# Patient Record
Sex: Female | Born: 1960 | Race: White | Hispanic: No | Marital: Married | Smoking: Never smoker
Health system: Southern US, Community
[De-identification: ages and names within clinical notes are randomized; demographics above are authoritative.]

## PROBLEM LIST (undated history)

## (undated) DIAGNOSIS — M329 Systemic lupus erythematosus, unspecified: Secondary | ICD-10-CM

## (undated) DIAGNOSIS — F419 Anxiety disorder, unspecified: Secondary | ICD-10-CM

## (undated) DIAGNOSIS — K2950 Unspecified chronic gastritis without bleeding: Secondary | ICD-10-CM

## (undated) DIAGNOSIS — K219 Gastro-esophageal reflux disease without esophagitis: Secondary | ICD-10-CM

## (undated) DIAGNOSIS — M1990 Unspecified osteoarthritis, unspecified site: Secondary | ICD-10-CM

## (undated) DIAGNOSIS — K859 Acute pancreatitis without necrosis or infection, unspecified: Secondary | ICD-10-CM

## (undated) DIAGNOSIS — K589 Irritable bowel syndrome without diarrhea: Secondary | ICD-10-CM

## (undated) DIAGNOSIS — E785 Hyperlipidemia, unspecified: Secondary | ICD-10-CM

## (undated) DIAGNOSIS — IMO0002 Reserved for concepts with insufficient information to code with codable children: Secondary | ICD-10-CM

## (undated) DIAGNOSIS — E538 Deficiency of other specified B group vitamins: Secondary | ICD-10-CM

## (undated) DIAGNOSIS — K621 Rectal polyp: Secondary | ICD-10-CM

## (undated) DIAGNOSIS — J45909 Unspecified asthma, uncomplicated: Secondary | ICD-10-CM

## (undated) HISTORY — PX: ESOPHAGUS SURGERY: SHX626

## (undated) HISTORY — DX: Gastro-esophageal reflux disease without esophagitis: K21.9

## (undated) HISTORY — DX: Acute pancreatitis without necrosis or infection, unspecified: K85.90

## (undated) HISTORY — DX: Deficiency of other specified B group vitamins: E53.8

## (undated) HISTORY — DX: Unspecified asthma, uncomplicated: J45.909

## (undated) HISTORY — DX: Unspecified osteoarthritis, unspecified site: M19.90

## (undated) HISTORY — DX: Anxiety disorder, unspecified: F41.9

## (undated) HISTORY — PX: BREAST CYST ASPIRATION: SHX578

## (undated) HISTORY — DX: Unspecified chronic gastritis without bleeding: K29.50

## (undated) HISTORY — DX: Irritable bowel syndrome, unspecified: K58.9

## (undated) HISTORY — DX: Rectal polyp: K62.1

## (undated) HISTORY — DX: Hyperlipidemia, unspecified: E78.5

## (undated) HISTORY — PX: SINUS SURGERY WITH INSTATRAK: SHX5215

---

## 1998-02-13 ENCOUNTER — Ambulatory Visit (HOSPITAL_COMMUNITY): Admission: RE | Admit: 1998-02-13 | Discharge: 1998-02-13 | Payer: Self-pay | Admitting: Internal Medicine

## 2000-05-21 ENCOUNTER — Other Ambulatory Visit: Admission: RE | Admit: 2000-05-21 | Discharge: 2000-05-21 | Payer: Self-pay | Admitting: Internal Medicine

## 2000-05-21 ENCOUNTER — Encounter (INDEPENDENT_AMBULATORY_CARE_PROVIDER_SITE_OTHER): Payer: Self-pay | Admitting: Specialist

## 2002-01-04 ENCOUNTER — Encounter: Admission: RE | Admit: 2002-01-04 | Discharge: 2002-01-04 | Payer: Self-pay | Admitting: Rheumatology

## 2002-01-04 ENCOUNTER — Encounter: Payer: Self-pay | Admitting: Rheumatology

## 2004-08-26 ENCOUNTER — Ambulatory Visit: Payer: Self-pay | Admitting: Internal Medicine

## 2004-09-20 ENCOUNTER — Ambulatory Visit: Payer: Self-pay | Admitting: Internal Medicine

## 2004-10-14 ENCOUNTER — Ambulatory Visit: Payer: Self-pay | Admitting: Internal Medicine

## 2005-01-30 ENCOUNTER — Ambulatory Visit: Payer: Self-pay | Admitting: Internal Medicine

## 2005-01-31 ENCOUNTER — Ambulatory Visit: Payer: Self-pay | Admitting: Internal Medicine

## 2005-02-04 ENCOUNTER — Encounter (INDEPENDENT_AMBULATORY_CARE_PROVIDER_SITE_OTHER): Payer: Self-pay | Admitting: Specialist

## 2005-02-04 ENCOUNTER — Ambulatory Visit (HOSPITAL_COMMUNITY): Admission: RE | Admit: 2005-02-04 | Discharge: 2005-02-04 | Payer: Self-pay | Admitting: Internal Medicine

## 2005-02-04 ENCOUNTER — Ambulatory Visit: Payer: Self-pay | Admitting: Internal Medicine

## 2005-02-25 ENCOUNTER — Ambulatory Visit: Payer: Self-pay | Admitting: Internal Medicine

## 2005-07-18 ENCOUNTER — Ambulatory Visit: Payer: Self-pay | Admitting: Internal Medicine

## 2005-08-07 ENCOUNTER — Ambulatory Visit: Payer: Self-pay | Admitting: Internal Medicine

## 2005-08-18 ENCOUNTER — Encounter (HOSPITAL_COMMUNITY): Admission: RE | Admit: 2005-08-18 | Discharge: 2005-11-16 | Payer: Self-pay | Admitting: Internal Medicine

## 2005-09-01 ENCOUNTER — Emergency Department (HOSPITAL_COMMUNITY): Admission: EM | Admit: 2005-09-01 | Discharge: 2005-09-01 | Payer: Self-pay | Admitting: Emergency Medicine

## 2005-09-25 ENCOUNTER — Ambulatory Visit: Payer: Self-pay | Admitting: Internal Medicine

## 2005-10-14 ENCOUNTER — Ambulatory Visit: Payer: Self-pay | Admitting: Internal Medicine

## 2005-10-14 ENCOUNTER — Ambulatory Visit (HOSPITAL_COMMUNITY): Admission: RE | Admit: 2005-10-14 | Discharge: 2005-10-14 | Payer: Self-pay | Admitting: Cardiology

## 2005-11-17 ENCOUNTER — Ambulatory Visit: Payer: Self-pay | Admitting: Internal Medicine

## 2005-11-20 ENCOUNTER — Ambulatory Visit: Payer: Self-pay | Admitting: Internal Medicine

## 2005-12-10 ENCOUNTER — Ambulatory Visit: Payer: Self-pay | Admitting: Internal Medicine

## 2005-12-25 ENCOUNTER — Encounter (HOSPITAL_COMMUNITY): Admission: RE | Admit: 2005-12-25 | Discharge: 2006-03-25 | Payer: Self-pay | Admitting: Internal Medicine

## 2006-01-09 ENCOUNTER — Ambulatory Visit: Payer: Self-pay | Admitting: Internal Medicine

## 2006-02-05 ENCOUNTER — Emergency Department (HOSPITAL_COMMUNITY): Admission: EM | Admit: 2006-02-05 | Discharge: 2006-02-05 | Payer: Self-pay | Admitting: Emergency Medicine

## 2006-02-10 ENCOUNTER — Ambulatory Visit: Payer: Self-pay | Admitting: Internal Medicine

## 2006-02-26 ENCOUNTER — Ambulatory Visit: Payer: Self-pay | Admitting: Internal Medicine

## 2006-03-02 ENCOUNTER — Ambulatory Visit: Payer: Self-pay | Admitting: Internal Medicine

## 2006-03-04 ENCOUNTER — Ambulatory Visit: Payer: Self-pay | Admitting: Internal Medicine

## 2006-04-23 ENCOUNTER — Ambulatory Visit: Payer: Self-pay | Admitting: Internal Medicine

## 2006-05-04 ENCOUNTER — Ambulatory Visit: Payer: Self-pay | Admitting: Internal Medicine

## 2006-06-05 ENCOUNTER — Ambulatory Visit: Payer: Self-pay | Admitting: Internal Medicine

## 2006-06-17 ENCOUNTER — Ambulatory Visit: Payer: Self-pay | Admitting: Internal Medicine

## 2006-08-26 ENCOUNTER — Ambulatory Visit: Payer: Self-pay | Admitting: Internal Medicine

## 2006-10-20 ENCOUNTER — Ambulatory Visit: Payer: Self-pay | Admitting: Internal Medicine

## 2007-03-08 ENCOUNTER — Ambulatory Visit: Payer: Self-pay | Admitting: Internal Medicine

## 2007-04-05 ENCOUNTER — Ambulatory Visit: Payer: Self-pay | Admitting: Internal Medicine

## 2007-05-14 ENCOUNTER — Ambulatory Visit: Payer: Self-pay | Admitting: Internal Medicine

## 2007-05-14 LAB — CONVERTED CEMR LAB
ALT: 25 units/L (ref 0–35)
AST: 20 units/L (ref 0–37)
Albumin: 3.8 g/dL (ref 3.5–5.2)
Alkaline Phosphatase: 65 units/L (ref 39–117)
BUN: 15 mg/dL (ref 6–23)
Basophils Absolute: 0.1 10*3/uL (ref 0.0–0.1)
Basophils Relative: 1.3 % — ABNORMAL HIGH (ref 0.0–1.0)
Bilirubin, Direct: 0.1 mg/dL (ref 0.0–0.3)
CO2: 23 meq/L (ref 19–32)
Calcium: 9.6 mg/dL (ref 8.4–10.5)
Chloride: 107 meq/L (ref 96–112)
Creatinine, Ser: 0.7 mg/dL (ref 0.4–1.2)
Eosinophils Absolute: 0.2 10*3/uL (ref 0.0–0.6)
Eosinophils Relative: 3.4 % (ref 0.0–5.0)
GFR calc Af Amer: 116 mL/min
GFR calc non Af Amer: 96 mL/min
Glucose, Bld: 116 mg/dL — ABNORMAL HIGH (ref 70–99)
HCT: 32.5 % — ABNORMAL LOW (ref 36.0–46.0)
Hemoglobin: 11 g/dL — ABNORMAL LOW (ref 12.0–15.0)
Lymphocytes Relative: 28.9 % (ref 12.0–46.0)
MCHC: 33.7 g/dL (ref 30.0–36.0)
MCV: 76.9 fL — ABNORMAL LOW (ref 78.0–100.0)
Monocytes Absolute: 0.5 10*3/uL (ref 0.2–0.7)
Monocytes Relative: 7.1 % (ref 3.0–11.0)
Neutro Abs: 4 10*3/uL (ref 1.4–7.7)
Neutrophils Relative %: 59.3 % (ref 43.0–77.0)
Platelets: 416 10*3/uL — ABNORMAL HIGH (ref 150–400)
Potassium: 4 meq/L (ref 3.5–5.1)
RBC: 4.23 M/uL (ref 3.87–5.11)
RDW: 15.7 % — ABNORMAL HIGH (ref 11.5–14.6)
Sed Rate: 38 mm/hr — ABNORMAL HIGH (ref 0–25)
Sodium: 138 meq/L (ref 135–145)
Total Bilirubin: 0.7 mg/dL (ref 0.3–1.2)
Total Protein: 7.3 g/dL (ref 6.0–8.3)
WBC: 6.7 10*3/uL (ref 4.5–10.5)

## 2007-08-11 ENCOUNTER — Ambulatory Visit: Payer: Self-pay | Admitting: Internal Medicine

## 2007-08-13 ENCOUNTER — Encounter: Payer: Self-pay | Admitting: Internal Medicine

## 2007-08-13 ENCOUNTER — Ambulatory Visit: Payer: Self-pay | Admitting: Internal Medicine

## 2007-08-16 ENCOUNTER — Ambulatory Visit: Payer: Self-pay | Admitting: Internal Medicine

## 2007-09-25 DIAGNOSIS — Z8719 Personal history of other diseases of the digestive system: Secondary | ICD-10-CM | POA: Insufficient documentation

## 2007-09-25 DIAGNOSIS — F411 Generalized anxiety disorder: Secondary | ICD-10-CM | POA: Insufficient documentation

## 2007-09-25 DIAGNOSIS — K589 Irritable bowel syndrome without diarrhea: Secondary | ICD-10-CM

## 2007-09-25 DIAGNOSIS — M129 Arthropathy, unspecified: Secondary | ICD-10-CM | POA: Insufficient documentation

## 2007-09-25 DIAGNOSIS — K5190 Ulcerative colitis, unspecified, without complications: Secondary | ICD-10-CM | POA: Insufficient documentation

## 2008-01-11 ENCOUNTER — Encounter: Payer: Self-pay | Admitting: Internal Medicine

## 2008-01-19 ENCOUNTER — Encounter: Payer: Self-pay | Admitting: Internal Medicine

## 2008-02-04 ENCOUNTER — Ambulatory Visit: Payer: Self-pay | Admitting: Internal Medicine

## 2008-02-04 DIAGNOSIS — K2970 Gastritis, unspecified, without bleeding: Secondary | ICD-10-CM | POA: Insufficient documentation

## 2008-02-04 DIAGNOSIS — R1013 Epigastric pain: Secondary | ICD-10-CM

## 2008-02-04 DIAGNOSIS — K2990 Gastroduodenitis, unspecified, without bleeding: Secondary | ICD-10-CM

## 2008-02-04 LAB — CONVERTED CEMR LAB
ALT: 19 units/L (ref 0–35)
AST: 18 units/L (ref 0–37)
Albumin: 3.6 g/dL (ref 3.5–5.2)
Alkaline Phosphatase: 71 units/L (ref 39–117)
BUN: 13 mg/dL (ref 6–23)
Basophils Absolute: 0 10*3/uL (ref 0.0–0.1)
Basophils Relative: 0.7 % (ref 0.0–1.0)
CO2: 27 meq/L (ref 19–32)
Calcium: 9.2 mg/dL (ref 8.4–10.5)
Chloride: 110 meq/L (ref 96–112)
Creatinine, Ser: 0.7 mg/dL (ref 0.4–1.2)
Eosinophils Absolute: 0.2 10*3/uL (ref 0.0–0.7)
Eosinophils Relative: 3 % (ref 0.0–5.0)
GFR calc Af Amer: 115 mL/min
GFR calc non Af Amer: 95 mL/min
Glucose, Bld: 104 mg/dL — ABNORMAL HIGH (ref 70–99)
HCT: 35.6 % — ABNORMAL LOW (ref 36.0–46.0)
Hemoglobin: 12 g/dL (ref 12.0–15.0)
Iron: 46 ug/dL (ref 42–145)
Lymphocytes Relative: 32.3 % (ref 12.0–46.0)
MCHC: 33.8 g/dL (ref 30.0–36.0)
MCV: 83.1 fL (ref 78.0–100.0)
Monocytes Absolute: 0.4 10*3/uL (ref 0.1–1.0)
Monocytes Relative: 6 % (ref 3.0–12.0)
Neutro Abs: 3.9 10*3/uL (ref 1.4–7.7)
Neutrophils Relative %: 58 % (ref 43.0–77.0)
Platelets: 378 10*3/uL (ref 150–400)
Potassium: 4.1 meq/L (ref 3.5–5.1)
RBC: 4.28 M/uL (ref 3.87–5.11)
RDW: 13.7 % (ref 11.5–14.6)
Saturation Ratios: 10.5 % — ABNORMAL LOW (ref 20.0–50.0)
Sed Rate: 32 mm/hr — ABNORMAL HIGH (ref 0–22)
Sodium: 142 meq/L (ref 135–145)
Total Bilirubin: 0.5 mg/dL (ref 0.3–1.2)
Total Protein: 7.1 g/dL (ref 6.0–8.3)
Transferrin: 311.6 mg/dL (ref >=212.0)
WBC: 6.6 10*3/uL (ref 4.5–10.5)

## 2008-02-11 ENCOUNTER — Ambulatory Visit (HOSPITAL_COMMUNITY): Admission: RE | Admit: 2008-02-11 | Discharge: 2008-02-11 | Payer: Self-pay | Admitting: Internal Medicine

## 2008-02-11 ENCOUNTER — Telehealth: Payer: Self-pay | Admitting: Internal Medicine

## 2008-03-13 ENCOUNTER — Telehealth: Payer: Self-pay | Admitting: Internal Medicine

## 2008-03-20 ENCOUNTER — Telehealth: Payer: Self-pay | Admitting: Internal Medicine

## 2008-03-27 ENCOUNTER — Ambulatory Visit: Payer: Self-pay | Admitting: Internal Medicine

## 2008-03-29 ENCOUNTER — Telehealth (INDEPENDENT_AMBULATORY_CARE_PROVIDER_SITE_OTHER): Payer: Self-pay | Admitting: *Deleted

## 2008-04-24 ENCOUNTER — Telehealth: Payer: Self-pay | Admitting: Internal Medicine

## 2008-04-26 ENCOUNTER — Ambulatory Visit: Payer: Self-pay | Admitting: Internal Medicine

## 2008-05-09 ENCOUNTER — Telehealth: Payer: Self-pay | Admitting: Internal Medicine

## 2008-06-01 ENCOUNTER — Encounter: Payer: Self-pay | Admitting: Internal Medicine

## 2008-06-14 ENCOUNTER — Ambulatory Visit: Payer: Self-pay | Admitting: Internal Medicine

## 2008-06-26 ENCOUNTER — Encounter: Payer: Self-pay | Admitting: Internal Medicine

## 2008-07-11 ENCOUNTER — Encounter: Payer: Self-pay | Admitting: Internal Medicine

## 2008-07-11 ENCOUNTER — Telehealth: Payer: Self-pay | Admitting: Internal Medicine

## 2008-07-14 ENCOUNTER — Telehealth: Payer: Self-pay | Admitting: Internal Medicine

## 2008-07-24 ENCOUNTER — Telehealth: Payer: Self-pay | Admitting: Internal Medicine

## 2008-08-11 ENCOUNTER — Encounter: Payer: Self-pay | Admitting: Internal Medicine

## 2008-10-02 ENCOUNTER — Ambulatory Visit: Payer: Self-pay | Admitting: Internal Medicine

## 2008-10-05 ENCOUNTER — Telehealth: Payer: Self-pay | Admitting: Internal Medicine

## 2008-10-23 ENCOUNTER — Telehealth: Payer: Self-pay | Admitting: Internal Medicine

## 2008-12-18 ENCOUNTER — Ambulatory Visit: Payer: Self-pay | Admitting: Internal Medicine

## 2008-12-21 ENCOUNTER — Ambulatory Visit: Payer: Self-pay | Admitting: Internal Medicine

## 2008-12-21 ENCOUNTER — Encounter: Payer: Self-pay | Admitting: Internal Medicine

## 2008-12-25 ENCOUNTER — Encounter: Payer: Self-pay | Admitting: Internal Medicine

## 2008-12-25 LAB — CONVERTED CEMR LAB
Albumin: 4 g/dL (ref 3.5–5.2)
Alkaline Phosphatase: 84 units/L (ref 39–117)
Basophils Relative: 5 % — ABNORMAL HIGH (ref 0.0–3.0)
Eosinophils Absolute: 0 10*3/uL (ref 0.0–0.7)
HCT: 38.8 % (ref 36.0–46.0)
Hemoglobin: 13.4 g/dL (ref 12.0–15.0)
Lymphs Abs: 1.7 10*3/uL (ref 0.7–4.0)
MCHC: 34.5 g/dL (ref 30.0–36.0)
MCV: 82.7 fL (ref 78.0–100.0)
Monocytes Absolute: 0.5 10*3/uL (ref 0.1–1.0)
Neutro Abs: 8 10*3/uL — ABNORMAL HIGH (ref 1.4–7.7)
RBC: 4.68 M/uL (ref 3.87–5.11)
Total Protein: 7.6 g/dL (ref 6.0–8.3)

## 2009-03-15 ENCOUNTER — Ambulatory Visit: Payer: Self-pay | Admitting: Internal Medicine

## 2009-03-18 ENCOUNTER — Encounter: Payer: Self-pay | Admitting: Internal Medicine

## 2009-03-23 ENCOUNTER — Telehealth: Payer: Self-pay | Admitting: Internal Medicine

## 2009-06-18 ENCOUNTER — Telehealth: Payer: Self-pay | Admitting: Internal Medicine

## 2009-08-14 ENCOUNTER — Ambulatory Visit: Payer: Self-pay | Admitting: Internal Medicine

## 2009-09-11 ENCOUNTER — Encounter: Payer: Self-pay | Admitting: Internal Medicine

## 2009-10-31 ENCOUNTER — Telehealth: Payer: Self-pay | Admitting: Internal Medicine

## 2009-11-29 ENCOUNTER — Ambulatory Visit: Payer: Self-pay | Admitting: Internal Medicine

## 2009-12-03 ENCOUNTER — Telehealth: Payer: Self-pay | Admitting: Internal Medicine

## 2009-12-17 ENCOUNTER — Telehealth: Payer: Self-pay | Admitting: Internal Medicine

## 2009-12-27 ENCOUNTER — Telehealth: Payer: Self-pay | Admitting: Internal Medicine

## 2010-02-01 ENCOUNTER — Telehealth: Payer: Self-pay | Admitting: Internal Medicine

## 2010-03-12 ENCOUNTER — Encounter: Payer: Self-pay | Admitting: Internal Medicine

## 2010-03-21 ENCOUNTER — Ambulatory Visit: Payer: Self-pay | Admitting: Internal Medicine

## 2010-03-21 LAB — CONVERTED CEMR LAB
AST: 14 units/L (ref 0–37)
BUN: 13 mg/dL (ref 6–23)
Calcium: 9.1 mg/dL (ref 8.4–10.5)
Chloride: 105 meq/L (ref 96–112)
Creatinine, Ser: 0.8 mg/dL (ref 0.4–1.2)
Iron: 81 ug/dL (ref 42–145)
Transferrin: 316 mg/dL (ref 212.0–360.0)

## 2010-03-25 ENCOUNTER — Ambulatory Visit: Payer: Self-pay | Admitting: Internal Medicine

## 2010-03-26 ENCOUNTER — Ambulatory Visit: Payer: Self-pay | Admitting: Internal Medicine

## 2010-03-26 DIAGNOSIS — E538 Deficiency of other specified B group vitamins: Secondary | ICD-10-CM | POA: Insufficient documentation

## 2010-03-27 ENCOUNTER — Ambulatory Visit: Payer: Self-pay | Admitting: Internal Medicine

## 2010-04-04 ENCOUNTER — Ambulatory Visit: Payer: Self-pay | Admitting: Internal Medicine

## 2010-04-11 ENCOUNTER — Ambulatory Visit: Payer: Self-pay | Admitting: Internal Medicine

## 2010-05-24 ENCOUNTER — Telehealth: Payer: Self-pay | Admitting: Internal Medicine

## 2010-06-13 ENCOUNTER — Telehealth: Payer: Self-pay | Admitting: Internal Medicine

## 2010-06-14 ENCOUNTER — Telehealth: Payer: Self-pay | Admitting: Internal Medicine

## 2010-06-28 ENCOUNTER — Telehealth: Payer: Self-pay | Admitting: Internal Medicine

## 2010-07-23 ENCOUNTER — Ambulatory Visit: Payer: Self-pay | Admitting: Internal Medicine

## 2010-08-20 ENCOUNTER — Ambulatory Visit: Payer: Self-pay | Admitting: Internal Medicine

## 2010-09-10 ENCOUNTER — Telehealth: Payer: Self-pay | Admitting: Internal Medicine

## 2010-09-18 ENCOUNTER — Other Ambulatory Visit: Payer: Self-pay | Admitting: Internal Medicine

## 2010-09-18 ENCOUNTER — Ambulatory Visit
Admission: RE | Admit: 2010-09-18 | Discharge: 2010-09-18 | Payer: Self-pay | Source: Home / Self Care | Attending: Internal Medicine | Admitting: Internal Medicine

## 2010-09-18 LAB — COMPREHENSIVE METABOLIC PANEL
ALT: 16 U/L (ref 0–35)
AST: 18 U/L (ref 0–37)
Albumin: 3.6 g/dL (ref 3.5–5.2)
Alkaline Phosphatase: 73 U/L (ref 39–117)
BUN: 11 mg/dL (ref 6–23)
CO2: 25 mEq/L (ref 19–32)
Calcium: 9.5 mg/dL (ref 8.4–10.5)
Chloride: 107 mEq/L (ref 96–112)
Creatinine, Ser: 0.8 mg/dL (ref 0.4–1.2)
GFR: 86.89 mL/min (ref >60.00)
Glucose, Bld: 89 mg/dL (ref 70–99)
Potassium: 4.2 mEq/L (ref 3.5–5.1)
Sodium: 140 mEq/L (ref 135–145)
Total Bilirubin: 0.3 mg/dL (ref 0.3–1.2)
Total Protein: 7 g/dL (ref 6.0–8.3)

## 2010-09-18 LAB — CBC WITH DIFFERENTIAL/PLATELET
Basophils Absolute: 0 10*3/uL (ref 0.0–0.1)
Basophils Relative: 0.5 % (ref 0.0–3.0)
Eosinophils Absolute: 0.4 10*3/uL (ref 0.0–0.7)
Eosinophils Relative: 4.3 % (ref 0.0–5.0)
HCT: 35.1 % — ABNORMAL LOW (ref 36.0–46.0)
Hemoglobin: 11.9 g/dL — ABNORMAL LOW (ref 12.0–15.0)
Lymphocytes Relative: 25.9 % (ref 12.0–46.0)
Lymphs Abs: 2.4 10*3/uL (ref 0.7–4.0)
MCHC: 34 g/dL (ref 30.0–36.0)
MCV: 77.6 fl — ABNORMAL LOW (ref 78.0–100.0)
Monocytes Absolute: 0.5 10*3/uL (ref 0.1–1.0)
Monocytes Relative: 5.1 % (ref 3.0–12.0)
Neutro Abs: 5.9 10*3/uL (ref 1.4–7.7)
Neutrophils Relative %: 64.2 % (ref 43.0–77.0)
Platelets: 465 10*3/uL — ABNORMAL HIGH (ref 150.0–400.0)
RBC: 4.53 Mil/uL (ref 3.87–5.11)
RDW: 14.4 % (ref 11.5–14.6)
WBC: 9.1 10*3/uL (ref 4.5–10.5)

## 2010-09-19 ENCOUNTER — Telehealth: Payer: Self-pay | Admitting: Internal Medicine

## 2010-09-20 ENCOUNTER — Encounter: Payer: Self-pay | Admitting: Internal Medicine

## 2010-09-29 ENCOUNTER — Encounter: Payer: Self-pay | Admitting: Internal Medicine

## 2010-10-01 ENCOUNTER — Other Ambulatory Visit: Payer: Self-pay | Admitting: Internal Medicine

## 2010-10-01 ENCOUNTER — Ambulatory Visit
Admission: RE | Admit: 2010-10-01 | Discharge: 2010-10-01 | Payer: Self-pay | Source: Home / Self Care | Attending: Internal Medicine | Admitting: Internal Medicine

## 2010-10-01 LAB — VITAMIN B12: Vitamin B-12: >1500 pg/mL — ABNORMAL HIGH (ref 211–911)

## 2010-10-08 NOTE — Assessment & Plan Note (Signed)
Summary: FOLLOW UP--CH.   History of Present Illness Visit Type: Follow-up Visit Primary GI MD: [name] MD Primary Provider: Synetta Fail, MD Requesting Provider: n/a Chief Complaint: Follow up, Pt c/o of possibe hemorrhoid with rectal bleeding  History of Present Illness:   This is a 50 year old white female with ulcerative colitis which is predominantly left-sided as per her colonoscopy in April 2010. Patient's last appointment was December 2010. She was reapproved for Humira starting April 2010 and has been on 40 mg every 2 weeks. She comes today for a routine follow up of her condition. She complains of continued lower abdominal cramping as well as increased urgency in bowel movements. She does think that her rectal bleeding has gotten better on suppositories and enemas although it has not completely subsided. She now complains of lower abdominal discomfort but no change in bowels other than the rectal bleeding. She has constant urgency and tenesmus. Patient takes Imodium p.r.n. diarrhea. Her prednisone has been reduced to 5 mg daily and will be tapered down to 3 mg daily on April 1st. Her weight has remained stable since her last visit.     GI Review of Systems    Reports abdominal pain.     Location of  Abdominal pain: lower abdomen.    Denies acid reflux, belching, bloating, chest pain, dysphagia with liquids, dysphagia with solids, heartburn, loss of appetite, nausea, vomiting, vomiting blood, weight loss, and  weight gain.      Reports hemorrhoids and  rectal bleeding.     Denies anal fissure, black tarry stools, change in bowel habit, constipation, diarrhea, diverticulosis, fecal incontinence, heme positive stool, irritable bowel syndrome, jaundice, light color stool, liver problems, and  rectal pain.    Current Medications (verified): 1)  Carafate 1 Gm/49ml Susp (Sucralfate) .... Take 2 Teaspoon By Mouth Four Times A Day 2)  Tramadol Hcl 50 Mg Tabs (Tramadol Hcl) .... Take 1  Tablet By Mouth Two Times A Day 3)  Dicyclomine Hcl 20 Mg Tabs (Dicyclomine Hcl) .... Take 1 Tablet By Mouth Four Times A Day 4)  Prednisone 5 Mg Tabs (Prednisone) .... Take As Directed. (Currently On 20 Mg) 5)  Promethazine Hcl 25 Mg Tabs (Promethazine Hcl) .Marland Kitchen.. 1 Tablet By Mouth Every 6-8 Hours As Needed For Nausea 6)  Vicodin 5-500 Mg Tabs (Hydrocodone-Acetaminophen) .... Take 1 Tablet By Mouth Every Four To Six Hours. Rx Must Last 1 Month! Must Keep Office Visit For Further Refills! 7)  Iron 18 Mg  Tbcr (Ferrous Fumarate) .... Once Daily 8)  Calcium 500/d 500-200 Mg-Unit  Tabs (Calcium Carbonate-Vitamin D) .... Once Daily 9)  Fish Oil 1000 Mg  Caps (Omega-3 Fatty Acids) .Marland Kitchen.. 1 Tablet By Mouth Once Daily 10)  Vitamin B-12 1000 Mcg  Tabs (Cyanocobalamin) .... Take 2 By Mouth Once Daily 11)  Canasa 1000 Mg  Supp (Mesalamine) .... Insert 1 Suppository Into Rectum Every Other Morning 12)  Lexapro 20 Mg  Tabs (Escitalopram Oxalate) .... Take 1 Tablet By Mouth Once A Day 13)  Hydrocortisone 100 Mg/36ml  Enem (Hydrocortisone) .... Insert 1 Enema Into Rectum Every Other Night 14)  Low-Ogestrel 0.3-30 Mg-Mcg  Tabs (Norgestrel-Ethinyl Estradiol) .Marland Kitchen.. 1 Tablet By Mouth Once Daily 15)  Crestor 10 Mg Tabs (Rosuvastatin Calcium) .... Once Daily 16)  Albuterol Sulfate (2.5 Mg/61ml) 0.083% Nebu (Albuterol Sulfate) .... As Needed 17)  Advair Diskus 100-50 Mcg/dose Misc (Fluticasone-Salmeterol) .Marland Kitchen.. 1 Puff Every 12 Hours As Needed 18)  Nexium 40 Mg Cpdr (Esomeprazole Magnesium) .Marland KitchenMarland KitchenMarland Kitchen  One Tablet By Mouth Two Times A Day 19)  Vitamin D 1000 Unit  Tabs (Cholecalciferol) .... One Tablet By Mouth Once Daily 20)  Humira Pen 40 Mg/0.58ml Kit (Adalimumab) .... Use As Directed 21)  Benadryl 25 Mg Tabs (Diphenhydramine Hcl) .... Take 2 Tablets By Mouth Once A Day As Needed 22)  Lialda 1.2 Gm Tbec (Mesalamine) .... Take 4 Tablets By Mouth Once Daily. (Hold Rx For Patient Until She Needs It Please)  Allergies  (verified): 1)  Remicade (Infliximab)  Past History:  Past Medical History: Last updated: 09/25/2007 Current Problems:  IRRITABLE BOWEL SYNDROME (ICD-564.1) ARTHRITIS (ICD-716.90) ANXIETY (ICD-300.00) PANCREATITIS, ACUTE, HX OF (ICD-V12.70) ULCERATIVE COLITIS (ICD-556.9)  Past Surgical History: Last updated: 04/26/2008 esophageal surgery cyst removal lt. breast sinus surgery  Family History: Last updated: 04/26/2008 No FH of Colon Cancer: Family History of Colitis/Crohn's:uncle  Family History of Diabetes: maternal grandfather Family History of Irritable Bowel Syndrome: father  Social History: Last updated: 08/14/2009 Occupation:Flight Attendant  Patient has never smoked.  Alcohol Use - no Daily Caffeine Use-no Patient does not get regular exercise.   Review of Systems       The patient complains of arthritis/joint pain, back pain, fatigue, and shortness of breath.  The patient denies allergy/sinus, anemia, anxiety-new, blood in urine, breast changes/lumps, change in vision, confusion, cough, coughing up blood, depression-new, fainting, fever, headaches-new, hearing problems, heart murmur, heart rhythm changes, itching, menstrual pain, muscle pains/cramps, night sweats, nosebleeds, pregnancy symptoms, skin rash, sleeping problems, sore throat, swelling of feet/legs, swollen lymph glands, thirst - excessive , urination - excessive , urination changes/pain, urine leakage, vision changes, and voice change.         Pertinent positive and negative review of systems were noted in the above HPI. All other ROS was otherwise negative.   Vital Signs:  Patient profile:   50 year old female Height:      68 inches Weight:      237 pounds BMI:     36.17 BSA:     2.20 Pulse rate:   100 / minute Pulse rhythm:   regular BP sitting:   122 / 72  (right arm)  Vitals Entered By: Merri Ray CMA Duncan Dull) (November 29, 2009 8:24 AM)  Physical Exam  General:  obese, alert and  oriented. Eyes:  PERRLA, no icterus. Mouth:  No deformity or lesions, dentition normal. Neck:  Supple; no masses or thyromegaly. Lungs:  Clear throughout to auscultation. Heart:  Regular rate and rhythm; no murmurs, rubs,  or bruits. Abdomen:  soft abdomen with tenderness in epigastrium, left and right lower quadrants. Minimal discomfort on deep pressure in the left lower quadrant. Rectal:  rectal and anoscopic exam reveals some edema and a fissure in the anal canal with friable mucosa of the rectal ampulla. There was bleeding and granularity consistent with severe proctitis. Her stool is Hemoccult positive. Extremities:  No clubbing, cyanosis, edema or deformities noted. Skin:  Intact without significant lesions or rashes. Psych:  Alert and cooperative. Normal mood and affect.   Impression & Recommendations:  Problem # 1:  ABDOMINAL PAIN, EPIGASTRIC (ICD-789.06) Patient has chronic epigastric pain managed with tramadol and PPIs.  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 We will continue to taper her prednisone and if necessary will increase her Humira to 80 mg every 2 weeks. She will increase her Canasa suppositories from 1,000 mg every other day to every day. She will remain on mesalamine 4.8  g daily as well.  Patient Instructions: 1)  refill on Lialda 1.2 g 4 tablets a day. 2)  Referral Canasa suppositories 1,000 mg daily. 3)  Analpram cream 2.5%- use t.i.d. p.r.n. on anal fissure. 4)  Reduce prednisone to 3 mg daily starting April 1st for 4 weeks then down to 1 mg daily for 4 weeks then discontinue. 5)  Consider increasing Humira to 80 mg every 2 weeks. 6)  Continue on Imodium and all other medications. 7)  Start exercise program with walking daily in an attempt to lose weight. 8)  Copy sent to : Dr Epifania Gore 9)  The medication list was reviewed and reconciled.  All changed / newly prescribed medications were explained.  A  complete medication list was provided to the patient / caregiver. Prescriptions: VICODIN 5-500 MG TABS (HYDROCODONE-ACETAMINOPHEN) Take 1 tablet by mouth every four to six hours. RX MUST LAST 1 MONTH! NOT TO BE FILLED UNTIL 12/29/09  #60 x 1   Entered by:   Hortense Ramal CMA (AAMA)   Authorized by:   Hart Carwin MD   Signed by:   Hortense Ramal CMA (AAMA) on 11/29/2009   Method used:   Printed then faxed to ...       [facility] [address] (retail)       [phone]       [place], [place]  [address]       Ph: [phone]       Fax: [phone]   RxID:   [id number] ZOFRAN 4 MG TABS (ONDANSETRON HCL) Take 1 tablet by mouth every 8 hours as needed for nausea  #20 x 0   Entered by:   Hortense Ramal CMA (AAMA)   Authorized by:   Hart Carwin MD   Signed by:   Hortense Ramal CMA (AAMA) on 11/29/2009   Method used:   Electronically to        [facility] [address] (retail)       [phone]       [place], [place]  [address]       Ph: [phone]       Fax: [phone]   RxID:   [id number] ANALPRAM-HC 1-2.5 % CREA (HYDROCORTISONE ACE-PRAMOXINE) Apply to rectum 3 times daily for anal fissure  #30 grams x 0   Entered by:   Hortense Ramal CMA (AAMA)   Authorized by:   Hart Carwin MD   Signed by:   Hortense Ramal CMA (AAMA) on 11/29/2009   Method used:   Electronically to        [facility] [address] (retail)       [phone]       [place], [place]  [address]       Ph: [phone]       Fax: [phone]   RxID:   [id number] CANASA 1000 MG  SUPP (MESALAMINE) Insert 1 suppository into rectum every day  #30 x 3   Entered by:   Hortense Ramal CMA (AAMA)   Authorized by:   Hart Carwin MD   Signed by:   Hortense Ramal CMA (AAMA) on 11/29/2009   Method used:   Electronically to        [facility] [address] (retail)       [phone]  [place], [place]  [address]       Ph: [phone]       Fax: [phone]   RxID:   [id number]

## 2010-10-08 NOTE — Progress Notes (Signed)
Summary: Triage  Phone Note Call from Patient Call back at Home Phone [phone]   Caller: Patient Call For: Dr. Juanda Chance Reason for Call: Talk to Nurse Summary of Call: pt wants to know if it's ok for her to get a flu shot while taking Humira Initial call taken by: Vallarie Mare,  October 31, 2009 10:32 AM  Follow-up for Phone Call        Yes, pt. may have the flu shot. Pt. instructed to call back as needed.  Follow-up by: Laureen Ochs LPN,  October 31, 2009 10:41 AM

## 2010-10-08 NOTE — Progress Notes (Signed)
Summary: Triage  Phone Note Call from Patient Call back at Home Phone [phone]   Caller: Patient Call For: Dr. Olevia Perches Summary of Call: Has not been taking her Humira b/c of infection and wants to know if she should continue taking it Initial call taken by: Webb Laws,  May 24, 2010 8:55 AM  Follow-up for Phone Call        She has a chronic UTI and is scheduled to see a urologist due to ongoing infection.  She was advised by her primary care to skip the last dose, she is scheduled to take her next dose on Tuesday.  Also has a cough.  Low grade temp 99.6.  She is scheduled to see Karna Christmas at [facility] on Thursday.  Please advise when she should resume Humira. Follow-up by: Barb Merino RN, Hart,  May 24, 2010 9:04 AM  Additional Follow-up for Phone Call Additional follow up Details #1::        OK to resume Humira if her U/A is back to normal

## 2010-10-08 NOTE — Assessment & Plan Note (Signed)
Summary: monthly b12 injection (pt was getting at pcp)  Nurse Visit   Medication Administration  Injection # 1:    Medication: Vit B12 1000 mcg    Diagnosis: B12 DEFICIENCY (ICD-266.2)    Route: IM    Site: L deltoid    Exp Date: 04/2012    Lot #: [id number]    Mfr: [place]    Comments: PT WILL RETURN ON 12/13 FOR NEXT INJECTION    Patient tolerated injection without complications    Given by: Abelino Derrick CMA Deborra Medina) (July 23, 2010 9:19 AM)  Orders Added: 1)  Vit B12 1000 mcg [T8004]

## 2010-10-08 NOTE — Assessment & Plan Note (Signed)
Summary: b12 weekly 2 of 4 (next b12 sch for 04/17/10 @ 9am)  Nurse Visit   Medication Administration  Injection # 1:    Medication: Vit B12 1000 mcg    Diagnosis: B12 DEFICIENCY (ICD-266.2)    Route: IM    Site: R deltoid    Exp Date: 12/2011    Lot #: [id number]    Mfr: [employer]    Comments: pt will retrun on 04/17/10 for next injection    Patient tolerated injection without complications    Given by: Francee Piccolo CMA Duncan Dull) (April 11, 2010 9:28 AM)  Orders Added: 1)  Vit B12 1000 mcg [J3420]

## 2010-10-08 NOTE — Assessment & Plan Note (Signed)
Summary: weekly b12 1 of 4 (next b12 sch for 04/10/10 @ 9am)  Nurse Visit   Allergies: 1)  Remicade (Infliximab)  Medication Administration  Injection # 1:    Medication: Vit B12 1000 mcg    Diagnosis: B12 DEFICIENCY (ICD-266.2)    Route: IM    Site: L deltoid    Exp Date: 01/07/2012    Lot #: [id number]    Mfr: [employer]    Patient tolerated injection without complications    Given by: Harlow Mares CMA (AAMA) (April 04, 2010 9:50 AM)

## 2010-10-08 NOTE — Progress Notes (Signed)
Summary: Triage  Phone Note Call from Patient Call back at Home Phone [phone]   Caller: Patient Call For: Dr. Juanda Chance Reason for Call: Talk to Nurse Summary of Call: pt. has a UTI and missed 2 doses of her Humira....Marland Kitchenstill has the infection, fever and having CT scan on 06-24-10....wants to know if she should continue taking or wait until after the CT scan Initial call taken by: Karna Christmas,  June 13, 2010 9:04 AM  Follow-up for Phone Call        last UA was not normal, but no bacteria was seen only saw blood.  She has CT scan scheduled for 06/24/10 to determine source of blood.  No antibiotics currently. Low grade temp 99.6.   She wants to know if she can resume her Humira?  Starting to have some rectal bleedingl.   Please advise.   Follow-up by: Darcey Nora RN, CGRN,  June 13, 2010 10:11 AM     Appended Document: Triage note reviewed. Please restart Humira ASAP.   Appended Document: Triage Patient  advised of Dr Regino Schultze orders.

## 2010-10-08 NOTE — Assessment & Plan Note (Signed)
Summary: b12 2of 3 days (next b12 sch for 03/27/10 @9am )  Nurse Visit   Allergies: 1)  Remicade (Infliximab)  Medication Administration  Injection # 1:    Medication: Vit B12 1000 mcg    Diagnosis: B12 DEFICIENCY (ICD-266.2)    Route: IM    Site: R deltoid    Exp Date: 2/13    Lot #: [id number]    Mfr: [employer]    Patient tolerated injection without complications    Given by: Lamona Curl CMA (AAMA) (March 26, 2010 9:05 AM)  Orders Added: 1)  Vit B12 1000 mcg [J3420]  Prescriptions: SULFASALAZINE 500 MG TABS (SULFASALAZINE) Take 2 tablets by mouth two times a day  #360 x 0   Entered by:   Lamona Curl CMA (AAMA)   Authorized by:   Hart Carwin MD   Signed by:   Lamona Curl CMA (AAMA) on 03/26/2010   Method used:   Electronically to        [facility] (retail)       [address]       [place], [place]  [address]       Ph: [phone]       Fax: [phone]   RxID:   [id number] SULFASALAZINE 500 MG TABS (SULFASALAZINE) Take 2 tablets by mouth two times a day  #360 x 0   Entered by:   Lamona Curl CMA (AAMA)   Authorized by:   Hart Carwin MD   Signed by:   Lamona Curl CMA (AAMA) on 03/26/2010   Method used:   Electronically to        [facility] [address] (retail)       [phone]       [place], [place]  [address]       Ph: [phone]       Fax: [phone]   RxID:   [id number]  Prescription sent to [facility] at [facility] in error. D/c'ed Prescription and sent new prescription to [facility] per Patient request. Patient states that she no longer uses [facility] (where we sent prescription day of her appointment) Dottie Nelson-Smith CMA Duncan Dull)  March 26, 2010 9:08 AM

## 2010-10-08 NOTE — Medication Information (Signed)
Summary: Ondansetron Approved/[employer]  Ondansetron Approved/[employer]   Imported By: Phillis Knack 12/10/2009 13:27:34  _____________________________________________________________________  External Attachment:    Type:   Image     Comment:   External Document

## 2010-10-08 NOTE — Progress Notes (Signed)
Summary: Triage-nausea, pain, bloating  Phone Note Call from Patient Call back at [facility] Phone [phone]   Caller: Patient Call For: Dr. Olevia Perches Reason for Call: Talk to Nurse Summary of Call: Alot of upper abd. pain. Using suppositiories and wants to know if that could be causing it. Initial call taken by: Webb Laws,  December 17, 2009 1:07 PM  Follow-up for Phone Call        Pt. c/o increased epigastric pain for 6 days. Also nausea, abd. bloating and low grade fever.  She thinks the Canasa supp. may be causing this problem OR she also began high blood pressure meds and had a flu and pneumonia shot recently, could this be causing the problems?  She takes Carafate QID, Nexium two times a day and Bentyl, but no relief in symptoms.  DR.BRODIE PLEASE ADVISE  Follow-up by: Vivia Ewing LPN,  December 17, 7541 3:05 PM  Additional Follow-up for Phone Call Additional follow up Details #1::        Please schedule GES to r/o gastroparesis. Ask her to stop the Bentyl for 48 hrs prior to the scan. I don't think it is the suppositories Additional Follow-up by: Lafayette Dragon MD,  December 17, 2009 3:30 PM    Additional Follow-up for Phone Call Additional follow up Details #2::    Above MD orders reviewed with patient. GES is scheduled at [facility] on 12-31-09 at 11am. (NPO after 95m) Pt. instructed to call back as needed.  Follow-up by: DVivia EwingLPN,  April 11, 260673:54 PM

## 2010-10-08 NOTE — Progress Notes (Signed)
Summary: Did we recieve paper work  Phone Note Call from Patient Call back at [employer] [phone]   Call For: Dr Olevia Perches Summary of Call: Did we receive paperwork her husband dropped off yesterday and needs to schedule her B12 if she is due. Initial call taken by: Irwin Brakeman [facility],  June 14, 2010 1:25 PM  Follow-up for Phone Call        Patient advised we did recieve the paperwork she dropped off yesterday. I have also scheduled her next b12 injection to be completed. Follow-up by: Madlyn Frankel CMA Deborra Medina),  June 14, 2010 3:27 PM

## 2010-10-08 NOTE — Progress Notes (Signed)
Summary: Medication refill  Phone Note Call from Patient Call back at Home Phone [phone]   Caller: Patient Call For: Dr. Juanda Chance Reason for Call: Refill Medication Summary of Call: Needs a refill on Tramadol Initial call taken by: Karna Christmas,  Feb 01, 2010 2:27 PM  Follow-up for Phone Call        Patient last got tramadol in 2/11....do you want me to continue giving this? Follow-up by: Lamona Curl CMA Duncan Dull),  Feb 01, 2010 3:10 PM  Additional Follow-up for Phone Call Additional follow up Details #1::        yes, she used to be on darvocet, this is better. Additional Follow-up by: Hart Carwin MD,  Feb 02, 2010 3:48 PM    New/Updated Medications: TRAMADOL HCL 50 MG TABS (TRAMADOL HCL) Take 1 tablet by mouth two times a day Prescriptions: TRAMADOL HCL 50 MG TABS (TRAMADOL HCL) Take 1 tablet by mouth two times a day  #60 x 2   Entered by:   Lamona Curl CMA (AAMA)   Authorized by:   Hart Carwin MD   Signed by:   Lamona Curl CMA (AAMA) on 02/05/2010   Method used:   Electronically to        [facility] [address] (retail)       [phone]       [place], [place]  [address]       Ph: [phone]       Fax: [phone]   RxID:   [id number]

## 2010-10-08 NOTE — Progress Notes (Signed)
Summary: Medication  Phone Note From Pharmacy   Caller: Osker Mason  [phone] Call For: Dr. Juanda Chance  Summary of Call: has some questions about the vicodin prescription Initial call taken by: Karna Christmas,  December 03, 2009 11:02 AM  Follow-up for Phone Call        [facility] pharmacy questions whether they should  go ahead and fill prescription of vicodin or wait until 12/29/09 when we told them to fill it. I have advised them that prescription is NOT TO BE FILLED UNTIL 12/29/09! Follow-up by: Hortense Ramal CMA Duncan Dull),  December 03, 2009 1:20 PM

## 2010-10-08 NOTE — Assessment & Plan Note (Signed)
Summary: b12 day1 of 3 (sch for 03/26/10@9am  for next shot)  Nurse Visit   Allergies: 1)  Remicade (Infliximab)  PPD Results    Date of reading: 03/25/2010    Results: < 5mm    Interpretation: negative  Medication Administration  Injection # 1:    Medication: Vit B12 1000 mcg    Diagnosis: B12 deficient    Route: IM    Site: L deltoid    Exp Date: 12/08/2011    Lot #: [id number]    Mfr: [employer]    Comments: This pt is scheduled for # 2 of 3 days in a row, 12-25-09 at 9:00 AM.     Patient tolerated injection without complications    Given by: Lowry Ram NCMA (March 25, 2010 9:00 AM)

## 2010-10-08 NOTE — Medication Information (Signed)
Summary: Humira Pt Assistance/[facility]  Humira Pt Assistance/[facility]   Imported By: Lester [place] 09/13/2009 10:56:27  _____________________________________________________________________  External Attachment:    Type:   Image     Comment:   External Document  Appended Document: Humira Pt Assistance/[facility]    Clinical Lists Changes  Medications: Changed medication from HUMIRA PEN 40 MG/0.8ML KIT (ADALIMUMAB) use as directed to HUMIRA PEN 40 MG/0.8ML KIT (ADALIMUMAB) use as directed - Signed Rx of HUMIRA PEN 40 MG/0.8ML KIT (ADALIMUMAB) use as directed;  #6 x 1;  Signed;  Entered by: Lamona Curl CMA (AAMA);  Authorized by: Hart Carwin MD;  Method used: Historical    Prescriptions: HUMIRA PEN 40 MG/0.8ML KIT (ADALIMUMAB) use as directed  #6 x 1   Entered by:   Lamona Curl CMA (AAMA)   Authorized by:   Hart Carwin MD   Signed by:   Lamona Curl CMA (AAMA) on 04/26/2010   Method used:   Historical   RxID:   [id number]

## 2010-10-08 NOTE — Assessment & Plan Note (Signed)
Summary: f/u--ch.             **(NEEDS TB SKIN TEST)   History of Present Illness Visit Type: Follow-up Visit Primary GI MD: [name] MD Primary Provider: Synetta Fail, MD Requesting Provider: n/a Chief Complaint: F/u for ulcerative colitis, rectal bleeding, and hemorrhoids. Pt states that she is feeling better and denies any GI complaints History of Present Illness:   This is a 50 year old white female with ulcerative colitis which is predominantly left-sided as per her colonoscopy in April 2010. Patient's last appointment was in March 2011. She was reapproved for Humira starting April 2010 and has been on 40 mg every 2 weeks. She comes today for a routine follow up of her condition. She complains of rectal bleeding despite using suppositories on a nightly basis.There has been no change in bowels other than the rectal bleeding. She has constant urgency and tenesmus. Patient takes Imodium p.r.n. diarrhea. She has discontinued her prednisone. Her weight has remained stable since her last visit.   GI Review of Systems      Denies abdominal pain, acid reflux, belching, bloating, chest pain, dysphagia with liquids, dysphagia with solids, heartburn, loss of appetite, nausea, vomiting, vomiting blood, weight loss, and  weight gain.        Denies anal fissure, black tarry stools, change in bowel habit, constipation, diarrhea, diverticulosis, fecal incontinence, heme positive stool, hemorrhoids, irritable bowel syndrome, jaundice, light color stool, liver problems, rectal bleeding, and  rectal pain.    Current Medications (verified): 1)  Carafate 1 Gm/30ml Susp (Sucralfate) .... Take 2 Teaspoon By Mouth Four Times A Day 2)  Tramadol Hcl 50 Mg Tabs (Tramadol Hcl) .... Take 1 Tablet By Mouth Two Times A Day 3)  Dicyclomine Hcl 20 Mg Tabs (Dicyclomine Hcl) .... Take 1 Tablet By Mouth Four Times A Day 4)  Promethazine Hcl 25 Mg Tabs (Promethazine Hcl) .Marland Kitchen.. 1 Tablet By Mouth Every 6-8 Hours As Needed  For Nausea 5)  Vicodin 5-500 Mg Tabs (Hydrocodone-Acetaminophen) .... Take 1 Tablet By Mouth Every Four To Six Hours. 6)  Iron 18 Mg  Tbcr (Ferrous Fumarate) .... Once Daily 7)  Calcium 500/d 500-200 Mg-Unit  Tabs (Calcium Carbonate-Vitamin D) .... Once Daily 8)  Fish Oil 1000 Mg  Caps (Omega-3 Fatty Acids) .Marland Kitchen.. 1 Tablet By Mouth Once Daily 9)  Vitamin B-12 1000 Mcg  Tabs (Cyanocobalamin) .... Take 2 By Mouth Once Daily 10)  Canasa 1000 Mg  Supp (Mesalamine) .... Insert 1 Suppository Into Rectum Every Day 11)  Lexapro 20 Mg  Tabs (Escitalopram Oxalate) .... Take 1 Tablet By Mouth Once A Day 12)  Hydrocortisone 100 Mg/62ml  Enem (Hydrocortisone) .... Insert 1 Enema Into Rectum Every Other Night 13)  Low-Ogestrel 0.3-30 Mg-Mcg  Tabs (Norgestrel-Ethinyl Estradiol) .Marland Kitchen.. 1 Tablet By Mouth Once Daily 14)  Crestor 10 Mg Tabs (Rosuvastatin Calcium) .... Once Daily 15)  Albuterol Sulfate (2.5 Mg/16ml) 0.083% Nebu (Albuterol Sulfate) .... As Needed 16)  Advair Diskus 100-50 Mcg/dose Misc (Fluticasone-Salmeterol) .Marland Kitchen.. 1 Puff Every 12 Hours As Needed 17)  Nexium 40 Mg Cpdr (Esomeprazole Magnesium) .... One Tablet By Mouth Two Times A Day 18)  Vitamin D 1000 Unit  Tabs (Cholecalciferol) .... One Tablet By Mouth Once Daily 19)  Humira Pen 40 Mg/0.67ml Kit (Adalimumab) .... Use As Directed 20)  Benadryl 25 Mg Tabs (Diphenhydramine Hcl) .... Take 2 Tablets By Mouth Once A Day As Needed 21)  Lialda 1.2 Gm Tbec (Mesalamine) .... Take 4 Tablets By  Mouth Once Daily. (Hold Rx For Patient Until She Needs It Please) 22)  Analpram-Hc 1-2.5 % Crea (Hydrocortisone Ace-Pramoxine) .... Apply To Rectum 3 Times Daily For Anal Fissure 23)  Zofran 4 Mg Tabs (Ondansetron Hcl) .... Take 1 Tablet By Mouth Every 8 Hours As Needed For Nausea  Allergies (verified): 1)  Remicade (Infliximab)  Past History:  Past Medical History: Reviewed history from 09/25/2007 and no changes required. Current Problems:  IRRITABLE BOWEL  SYNDROME (ICD-564.1) ARTHRITIS (ICD-716.90) ANXIETY (ICD-300.00) PANCREATITIS, ACUTE, HX OF (ICD-V12.70) ULCERATIVE COLITIS (ICD-556.9)  Past Surgical History: Reviewed history from 04/26/2008 and no changes required. esophageal surgery cyst removal lt. breast sinus surgery  Family History: Reviewed history from 04/26/2008 and no changes required. No FH of Colon Cancer: Family History of Colitis/Crohn's:uncle  Family History of Diabetes: maternal grandfather Family History of Irritable Bowel Syndrome: father  Social History: Reviewed history from 08/14/2009 and no changes required. 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  Alcohol Use - no Daily Caffeine Use-no Patient does not get regular exercise.   Review of Systems  The patient denies allergy/sinus, anemia, anxiety-new, arthritis/joint pain, back pain, blood in urine, breast changes/lumps, change in vision, confusion, cough, coughing up blood, depression-new, fainting, fatigue, fever, headaches-new, hearing problems, heart murmur, heart rhythm changes, itching, menstrual pain, muscle pains/cramps, night sweats, nosebleeds, pregnancy symptoms, shortness of breath, skin rash, sleeping problems, sore throat, swelling of feet/legs, swollen lymph glands, thirst - excessive , urination - excessive , urination changes/pain, urine leakage, vision changes, and voice change.         Pertinent positive and negative review of systems were noted in the above HPI. All other ROS was otherwise negative.   Vital Signs:  Patient profile:   50 year old female Height:      68 inches Weight:      234 pounds BMI:     35.71 BSA:     2.19 Pulse rate:   88 / minute Pulse rhythm:   regular BP sitting:   120 / 74  (left arm) Cuff size:   regular  Vitals Entered By: Ok Anis CMA (March 21, 2010 8:14 AM)  Physical Exam  General:  overweight, alert and oriented. Eyes:  PERRLA, no icterus. Mouth:  No deformity or lesions,  dentition normal. Neck:  Supple; no masses or thyromegaly. Lungs:  Clear throughout to auscultation. Heart:  Regular rate and rhythm; no murmurs, rubs,  or bruits. Abdomen:  soft obese abdomen, diffusely tender more so in the left lower quadrant. No distention and no rebound. Rectal:  Anoscopic exam reveals mildly erythematous mucosa without friability or spontaneous bleeding consistent with very mild proctitis. Stool is Hemoccult negative. Extremities:  No clubbing, cyanosis, edema or deformities noted. Skin:  Intact without significant lesions or rashes. Psych:  Alert and cooperative. Normal mood and affect.   Impression & Recommendations:  Problem # 1:  ULCERATIVE COLITIS (ICD-556.9) Patient has ulcerative colitis which is under reasonable control on Humira 40 mg every other week. This is the best she has done in a while. This most likely is because of the Humira. She will continue on the same dose and at will at the same time continue on Canasa suppositories. We will switch her from Asacol to sulfasalazine because of the expense. We will review her blood test from Dr. Derrell Lolling which were done last week. She is currently on complete disability and will be reassessed in 5 years. She has received a TB skin test today.  Problem #  2:  GASTRITIS (ICD-535.50) Patient's last upper endoscopy in December 2008 showed erosive gastritis.  Problem # 3:  ARTHRITIS (ICD-716.90) Patient has chronic lower back pain for which she takes Vicodin and tramadol.  Other Orders: TB Skin Test [phone]) Admin 1st Vaccine ([id number])  Patient Instructions: 1)  We will refill Vicodin when patient is due for more. She should have enough until 05/09/10. 2)  Sulfasalazine 500 mg 2 p.o. b.i.d. start with 2 p.o. q.d. for 2 weeks then increase to 4 a day. 3)  CBC, metabolic panel, sedimentation rate, iron studies, B12 levels. 4)  Office visit 6 months. 5)  Copy sent to : Dr Epifania Gore 6)  The medication list was reviewed and  reconciled.  All changed / newly prescribed medications were explained.  A complete medication list was provided to the patient / caregiver. Prescriptions: SULFASALAZINE 500 MG TABS (SULFASALAZINE) Take 2 tablets by mouth two times a day  #360 x 0   Entered by:   Lamona Curl CMA (AAMA)   Authorized by:   Hart Carwin MD   Signed by:   Lamona Curl CMA (AAMA) on 03/21/2010   Method used:   Electronically to        [facility] (mail-order)       [address]       [place], [place]  [address]       Ph: [phone]       Fax: [phone]   RxID:   [id number]    Immunizations Administered:  PPD Skin Test:    Vaccine Type: PPD    Site: left forearm    Mfr: Sanofi Pasteur    Dose: 0.1 ml    Route: ID    Given by: Lamona Curl CMA (AAMA)    Exp. Date: 06/21/2011    Lot #: [id number]

## 2010-10-08 NOTE — Assessment & Plan Note (Signed)
Summary: b12 day3 of 3 (next b12 sch for 04/03/10 @ 9am)  Nurse Visit   Allergies: 1)  Remicade (Infliximab)  Medication Administration  Injection # 1:    Medication: Vit B12 1000 mcg    Diagnosis: B12 DEFICIENCY (ICD-266.2)    Route: IM    Site: L deltoid    Exp Date: 10/2011    Lot #: [id number]    Mfr: American Regent    Patient tolerated injection without complications    Given by: Milford Cage NCMA (March 27, 2010 9:18 AM)  Orders Added: 1)  Vit B12 1000 mcg [[id number]]

## 2010-10-08 NOTE — Progress Notes (Signed)
Summary: Triage-Pt. cancelled GES  Phone Note Call from Patient Call back at Home Phone [phone]   Caller: Patient Call For: Dr. Juanda Chance Reason for Call: Talk to Nurse Summary of Call: pt. has some comments about her GES at [facility] Monday. Initial call taken by: Karna Christmas,  December 27, 2009 8:14 AM  Follow-up for Phone Call        F/U from triage on 12-17-09. Pt. is scheduled for a GES on 12-31-09 at 11am. She states she is feeling much, much better and her PCP states it was probably from her pneumonia, flu and Humira shots taken all together. Pt. has cancelled the GES and will callback as needed. Follow-up by: Laureen Ochs LPN,  December 27, 2009 9:01 AM  Additional Follow-up for Phone Call Additional follow up Details #1::        OK Additional Follow-up by: Hart Carwin MD,  December 27, 2009 1:23 PM

## 2010-10-08 NOTE — Progress Notes (Signed)
Summary: Questions about forms  Phone Note Call from Patient Call back at Home Phone [phone]   Caller: Patient Call For: Dr. Juanda Chance Reason for Call: Talk to Nurse Summary of Call: Sent forms over and has some questions about paperwork Initial call taken by: Karna Christmas,  June 28, 2010 8:05 AM  Follow-up for Phone Call        I have left a message for the patient to call back. Dottie Nelson-Smith CMA Duncan Dull)  June 28, 2010 9:34 AM  Patient would like a couple things added.... Dottie Nelson-Smith CMA Duncan Dull)  June 28, 2010 11:24 AM

## 2010-10-10 ENCOUNTER — Telehealth: Payer: Self-pay | Admitting: Internal Medicine

## 2010-10-10 NOTE — Assessment & Plan Note (Signed)
Summary: 4 MO F/U. needs tb test!...AS.   History of Present Illness Visit Type: Follow-up Visit Primary GI MD: [name] MD Primary Provider: Synetta Fail, MD Requesting Provider: n/a Chief Complaint: F/u for ulcerative colitis. Pt c/o a low grade temp but thinks it is due to Humira injections  History of Present Illness:   This is a 50 year old white female with predominantly left-sided ulcerative colitis of at least 20 years duration. She is under reasonable control with Humira 40 mg every 2 weeks. She has been running a fever of 101 for 2-3 days after the Humira injections. Her colitis has been doing well since beginning the Humira. She has been able to get off prednisone and continues on Asacol. Her last colonoscopy was in April 2010. She has been on total disability on the grounds of her ulcerative colitis. An upper endoscopy in December 2008 showed gastritis. She has chronic abdominal pain. She also has a history of 6 MP induce pancreatitis.   GI Review of Systems      Denies abdominal pain, acid reflux, belching, bloating, chest pain, dysphagia with liquids, dysphagia with solids, heartburn, loss of appetite, nausea, vomiting, vomiting blood, weight loss, and  weight gain.        Denies anal fissure, black tarry stools, change in bowel habit, constipation, diarrhea, diverticulosis, fecal incontinence, heme positive stool, hemorrhoids, irritable bowel syndrome, jaundice, light color stool, liver problems, rectal bleeding, and  rectal pain.    Current Medications (verified): 1)  Carafate 1 Gm/9ml Susp (Sucralfate) .... Take 2 Teaspoon By Mouth Four Times A Day 2)  Tramadol Hcl 50 Mg Tabs (Tramadol Hcl) .... Take 1 Tablet By Mouth Two Times A Day 3)  Dicyclomine Hcl 20 Mg Tabs (Dicyclomine Hcl) .... Take 1 Tablet By Mouth Four Times A Day 4)  Promethazine Hcl 25 Mg Tabs (Promethazine Hcl) .Marland Kitchen.. 1 Tablet By Mouth Every 6-8 Hours As Needed For Nausea 5)  Vicodin 5-500 Mg Tabs  (Hydrocodone-Acetaminophen) .... Take 1 Tablet By Mouth Every Four To Six Hours. 6)  Iron 18 Mg  Tbcr (Ferrous Fumarate) .... Once Daily 7)  Calcium 500/d 500-200 Mg-Unit  Tabs (Calcium Carbonate-Vitamin D) .... Once Daily 8)  Fish Oil 1000 Mg  Caps (Omega-3 Fatty Acids) .Marland Kitchen.. 1 Tablet By Mouth Once Daily 9)  Vitamin B-12 1000 Mcg  Tabs (Cyanocobalamin) .... Take 2 By Mouth Once Daily 10)  Canasa 1000 Mg  Supp (Mesalamine) .... Insert 1 Suppository Into Rectum Every Day 11)  Lexapro 20 Mg  Tabs (Escitalopram Oxalate) .... Take 1 Tablet By Mouth Once A Day 12)  Hydrocortisone 100 Mg/3ml  Enem (Hydrocortisone) .... Insert 1 Enema Into Rectum Every Other Night 13)  Low-Ogestrel 0.3-30 Mg-Mcg  Tabs (Norgestrel-Ethinyl Estradiol) .Marland Kitchen.. 1 Tablet By Mouth Once Daily 14)  Crestor 10 Mg Tabs (Rosuvastatin Calcium) .... Once Daily 15)  Albuterol Sulfate (2.5 Mg/63ml) 0.083% Nebu (Albuterol Sulfate) .... As Needed 16)  Advair Diskus 100-50 Mcg/dose Misc (Fluticasone-Salmeterol) .Marland Kitchen.. 1 Puff Every 12 Hours As Needed 17)  Nexium 40 Mg Cpdr (Esomeprazole Magnesium) .... One Tablet By Mouth Two Times A Day 18)  Vitamin D 1000 Unit  Tabs (Cholecalciferol) .... One Tablet By Mouth Once Daily 19)  Humira Pen 40 Mg/0.77ml Kit (Adalimumab) .... Use As Directed 20)  Benadryl 25 Mg Tabs (Diphenhydramine Hcl) .... Take 2 Tablets By Mouth Once A Day As Needed 21)  Analpram-Hc 1-2.5 % Crea (Hydrocortisone Ace-Pramoxine) .... Apply To Rectum 3 Times Daily For Anal Fissure 22)  Zofran 4 Mg Tabs (Ondansetron Hcl) .... Take 1 Tablet By Mouth Every 8 Hours As Needed For Nausea 23)  Asacol 400 Mg Tbec (Mesalamine) .... Take 3 Tablets By Mouth Three Times A Day 24)  Singulair 10 Mg Tabs (Montelukast Sodium) .... One Tablet By Mouth As Needed  Allergies (verified): 1)  Remicade (Infliximab)  Past History:  Past Medical History: IRRITABLE BOWEL SYNDROME (ICD-564.1) ARTHRITIS (ICD-716.90) ANXIETY (ICD-300.00) PANCREATITIS,  ACUTE, HX OF (ICD-V12.70) ULCERATIVE COLITIS (ICD-556.9)  Past Surgical History: Reviewed history from 04/26/2008 and no changes required. esophageal surgery cyst removal lt. breast sinus surgery  Family History: Reviewed history from 04/26/2008 and no changes required. No FH of Colon Cancer: Family History of Colitis/Crohn's:uncle  Family History of Diabetes: maternal grandfather Family History of Irritable Bowel Syndrome: father  Social History: Reviewed history from 08/14/2009 and no changes required. 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  Alcohol Use - no Daily Caffeine Use-no Patient does not get regular exercise.   Review of Systems       The patient complains of allergy/sinus.  The patient denies anemia, anxiety-new, arthritis/joint pain, back pain, blood in urine, breast changes/lumps, change in vision, confusion, cough, coughing up blood, depression-new, fainting, fatigue, fever, headaches-new, hearing problems, heart murmur, heart rhythm changes, itching, menstrual pain, muscle pains/cramps, night sweats, nosebleeds, pregnancy symptoms, shortness of breath, skin rash, sleeping problems, sore throat, swelling of feet/legs, swollen lymph glands, thirst - excessive , urination - excessive , urination changes/pain, urine leakage, vision changes, and voice change.         Pertinent positive and negative review of systems were noted in the above HPI. All other ROS was otherwise negative.   Vital Signs:  Patient profile:   50 year old female Height:      68 inches Weight:      235 pounds BMI:     35.86 BSA:     2.19 Pulse rate:   88 / minute Pulse rhythm:   regular BP sitting:   126 / 74  (left arm) Cuff size:   regular  Vitals Entered By: Ok Anis CMA (September 18, 2010 9:44 AM)  Physical Exam  General:  Well developed, well nourished, no acute distress. Eyes:  PERRLA, no icterus. Mouth:  No deformity or lesions, dentition normal. Neck:  Supple;  no masses or thyromegaly. Lungs:  Clear throughout to auscultation. Heart:  Regular rate and rhythm; no murmurs, rubs,  or bruits. Abdomen:  mild tenderness in epigastrium and in the left lower quadrant Rectal:  soft Hemoccult positive stool Msk:  Symmetrical with no gross deformities. Normal posture. Extremities:  No clubbing, cyanosis, edema or deformities noted. Skin:  Intact without significant lesions or rashes. Psych:  Alert and cooperative. Normal mood and affect.   Impression & Recommendations:  Problem # 1:  ULCERATIVE COLITIS (ICD-556.9) Patient has left-sided ulcerative colitis which is under reasonable control on Humira and Asacol. Her fever after Humira injections may indicate a drug reaction. She will take prednisone 20 mg for 2 days after Humira injections. Orders: TLB-CBC Platelet - w/Differential (85025-CBCD) TLB-CMP (Comprehensive Metabolic Pnl) (80053-COMP)  Problem # 2:  ABDOMINAL PAIN, EPIGASTRIC (ICD-789.06) Patient has chronic gastritis. She also has a history of pancreatitis from 6-MP. She controls the pain with PPIs and Carafate.  Other Orders: TB Skin Test [phone]) Admin 1st Vaccine (98119)  Patient Instructions: 1)  Your physician requests that you go to the basement floor of our office to have the following labwork completed before leaving  today: CBC, CMET. 2)  Please come to the office on Friday afternoon for your TB reading. 3)  Please pick up your prescriptions at the pharmacy. Electronic prescription(s) has already been sent for Prednisone 20 mg. You should take x 2 days starting 1 day after each Humira Injection. 4)  Please schedule a follow-up appointment in 6 months. 5)  prednisone 20 mg stalk Monday off going Humira injection and take it for 2 days every 2 weeks 6)  Copy sent to : Synetta Fail, MD 7)  The medication list was reviewed and reconciled.  All changed / newly prescribed medications were explained.  A complete medication list was provided  to the patient / caregiver. Prescriptions: PREDNISONE 20 MG TABS (PREDNISONE) Take as directed  #30 x 1   Entered by:   Lamona Curl CMA (AAMA)   Authorized by:   Hart Carwin MD   Signed by:   Lamona Curl CMA (AAMA) on 09/18/2010   Method used:   Print then Give to Patient   RxID:   [id number]    Orders Added: 1)  TB Skin Test [86580] 2)  Admin 1st Vaccine [90471] 3)  TLB-CBC Platelet - w/Differential [85025-CBCD] 4)  TLB-CMP (Comprehensive Metabolic Pnl) [80053-COMP]    Immunizations Administered:  PPD Skin Test:    Vaccine Type: PPD    Site: right forearm    Mfr: Sanofi Pasteur    Dose: 0.1 ml    Route: ID    Given by: Lamona Curl CMA (AAMA)    Exp. Date: 03/14/2012    Lot #: [id number]

## 2010-10-10 NOTE — Miscellaneous (Signed)
Summary: PPD Reading  Clinical Lists Changes  Observations: Added new observation of TB PPDRESULT: negative (09/20/2010 10:42) Added new observation of PPD RESULT: < 5mm (09/20/2010 10:42) Added new observation of TB-PPD RDDTE: 09/20/2010 (09/20/2010 10:42)      PPD Results    Date of reading: 09/20/2010    Results: < 5mm    Interpretation: negative

## 2010-10-10 NOTE — Assessment & Plan Note (Signed)
Summary: MONTLY B12 SHOT  Nurse Visit   Allergies: 1)  Remicade (Infliximab)  Medication Administration  Injection # 1:    Medication: Vit B12 1000 mcg    Diagnosis: B12 DEFICIENCY (ICD-266.2)    Route: IM    Site: R deltoid    Exp Date: 06/08/2012    Lot #: [id number]    Mfr: American Regent    Patient tolerated injection without complications    Given by: Selinda Michaels RN (October 01, 2010 8:59 AM)  Orders Added: 1)  Vit B12 1000 mcg [J3420] Patient instructed to go to the lab to have B12 level drawn.

## 2010-10-10 NOTE — Progress Notes (Signed)
Summary: Triage  Phone Note Call from Patient Call back at Home Phone [phone]   Caller: Patient Call For: Dr. Juanda Chance Reason for Call: Talk to Nurse Summary of Call: Wants to know if she is a canidate for Hypogammaglobulin Initial call taken by: Karna Christmas,  September 19, 2010 9:41 AM  Follow-up for Phone Call        Spoke with patient. Her mother is a Engineer, civil (consulting) and told her about an IV infusion for the immune system. She is wondering if she could get IV Hypogammaglobulin for her immune system. Please, advise Follow-up by: Jesse Fall RN,  September 19, 2010 10:03 AM  Additional Follow-up for Phone Call Additional follow up Details #1::        Gammaglobulin is not used  to boost  the immune system which may be supressed due to Humira or remicaide. Additional Follow-up by: Hart Carwin MD,  September 19, 2010 9:50 PM    Additional Follow-up for Phone Call Additional follow up Details #2::    Patient given Dr. Regino Schultze answer re: gammaglobulin. Follow-up by: Jesse Fall RN,  September 20, 2010 10:04 AM

## 2010-10-10 NOTE — Assessment & Plan Note (Signed)
Summary: MONTHLY B12 INJ/266.2//SP  Nurse Visit   Allergies: 1)  Remicade (Infliximab)  Medication Administration  Injection # 1:    Medication: Vit B12 1000 mcg    Diagnosis: B12 DEFICIENCY (ICD-266.2)    Route: IM    Site: L deltoid    Exp Date: 06/08/2012    Lot #: [id number]    Mfr: American Regent    Comments: Monthly B12 injection    Patient tolerated injection without complications    Given by: June McMurray [place] Deborra Medina) (August 26, 2010 9:09 AM)  Orders Added: 1)  Vit B12 1000 mcg [J3420]   Medication Administration  Injection # 1:    Medication: Vit B12 1000 mcg    Diagnosis: B12 DEFICIENCY (ICD-266.2)    Route: IM    Site: L deltoid    Exp Date: 06/08/2012    Lot #: [id number]    Mfr: American Regent    Comments: Monthly B12 injection    Patient tolerated injection without complications    Given by: June McMurray [place] Deborra Medina) (August 26, 2010 9:09 AM)  Orders Added: 1)  Vit B12 1000 mcg [A1587]

## 2010-10-10 NOTE — Progress Notes (Signed)
Summary: Medication  Phone Note Call from Patient Call back at Home Phone [phone]   Caller: Pharmacist Call For: Dr. Juanda Chance Reason for Call: Talk to Nurse Summary of Call: Pharmacist at [facility] is calling to request that we call in Asacol for this patient Initial call taken by: [place] Johnson,  September 10, 2010 11:26 AM  Follow-up for Phone Call        Patient states that she would rather have asacol. Are you okay with switching her back from sulfasalazine to Asacol? If so, how much Asacol would you like her to be on? Follow-up by: Lamona Curl CMA Duncan Dull),  September 10, 2010 12:20 PM  Additional Follow-up for Phone Call Additional follow up Details #1::        Asacal 400mg , #270, 3 by mouth three times a day, 3 refills Additional Follow-up by: Hart Carwin MD,  September 10, 2010 1:01 PM    New/Updated Medications: ASACOL 400 MG TBEC (MESALAMINE) Take 3 tablets by mouth three times a day Prescriptions: ASACOL 400 MG TBEC (MESALAMINE) Take 3 tablets by mouth three times a day  #270 x 3   Entered by:   Lamona Curl CMA (AAMA)   Authorized by:   Hart Carwin MD   Signed by:   Lamona Curl CMA (AAMA) on 09/10/2010   Method used:   Electronically to        [facility] (retail)       [address]       [place], [place]  [address]       Ph: [phone]       Fax: [phone]   RxID:   [id number]

## 2010-10-16 NOTE — Progress Notes (Signed)
Summary: Discuss meds  Phone Note Call from Patient Call back at Home Phone [phone]   Call For: Dr Juanda Chance Reason for Call: Talk to Nurse Summary of Call: Wants to discuss Nexium & tramadol. Initial call taken by: Leanor Kail [facility],  October 10, 2010 9:04 AM  Follow-up for Phone Call        Patient states that she got a note from Medicare that they will no longer cover Nexium. I have explained that when she is due for a refill, she should call the pharmacy as she normally would. If insurance will not cover medication, the pharmacy will send [place] a form and we will begin a prior authorization. Patient verbalizes understanding. She also needs a refill on her tramadol which I have went ahead and sent to her pharmacy. Follow-up by: Lamona Curl CMA (AAMA),  October 10, 2010 9:55 AM    New/Updated Medications: TRAMADOL HCL 50 MG TABS (TRAMADOL HCL) Take 1 tablet by mouth two times a day Prescriptions: TRAMADOL HCL 50 MG TABS (TRAMADOL HCL) Take 1 tablet by mouth two times a day  #60 x 1   Entered by:   Lamona Curl CMA (AAMA)   Authorized by:   Hart Carwin MD   Signed by:   Lamona Curl CMA (AAMA) on 10/10/2010   Method used:   Electronically to        [facility] (retail)       [address]       [place], [place]  [address]       Ph: [phone]       Fax: [phone]   RxID:   [id number]

## 2010-12-09 ENCOUNTER — Other Ambulatory Visit: Payer: Self-pay | Admitting: Internal Medicine

## 2010-12-09 ENCOUNTER — Telehealth: Payer: Self-pay | Admitting: Internal Medicine

## 2010-12-09 NOTE — Telephone Encounter (Signed)
Called back to speak to [facility]. They are unsure as to what exactly they need. They will call back.

## 2010-12-10 ENCOUNTER — Other Ambulatory Visit: Payer: Self-pay | Admitting: Internal Medicine

## 2010-12-10 MED ORDER — ADALIMUMAB 40 MG/0.8ML ~~LOC~~ KIT: PACK | SUBCUTANEOUS | Status: DC

## 2010-12-10 NOTE — Telephone Encounter (Signed)
Faxed to [facility] @ [phone]

## 2010-12-12 ENCOUNTER — Encounter: Payer: Self-pay | Admitting: Internal Medicine

## 2010-12-12 NOTE — Telephone Encounter (Signed)
error 

## 2010-12-17 ENCOUNTER — Telehealth: Payer: Self-pay | Admitting: Internal Medicine

## 2010-12-17 NOTE — Telephone Encounter (Signed)
rx refaxed to [facility] while patient on phone (pt did not want rx sent to [facility], just [facility]). Fax was sending to [facility] at time of phone call.

## 2011-01-06 ENCOUNTER — Other Ambulatory Visit: Payer: Self-pay | Admitting: Internal Medicine

## 2011-01-21 NOTE — Assessment & Plan Note (Signed)
[place] OFFICE NOTE   Kaitlyn, Jennings                        MRN:          [id number]  DATE:03/08/2007                            DOB:          08-26-1961    [name] is a 50 year old white female with ulcerative colitis  predominately left colon. Last colonoscopy in 2006. She has never been  in complete remission but currently is doing reasonably well, having  only small amount of blood and urgency. We have been trying to taper off  her steroids, combining topical as well as systemic steroids. 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 We have been  limited in her medications because of allergies and intolerance. She is  currently on;  1. Asacol 12 tablets a day.  2. Prevacid 30 mg p.o. b.i.d.  3. Lexapro 10 mg p.o. daily.  4. Imodium 3 or 4 a day.  5. Nasacort.  6. Canasa suppositories 1000 mg q.a.m.  7. Dicyclomine 20 mg p.o. t.i.d.  8. Prednisone 7 mg p.o. daily.  9. Cort enema 1 at bedtime.  10.Folic acid 1 mg daily.  11.Fosamax 70 mg weekly.  12.Multivitamins.  13.Fish oil.  14.She also takes Vicodin p.r.n.  15.Darvocet p.r.n. pain.   PHYSICAL EXAMINATION:  Blood pressure 112/80, pulse 72, and weight 213  pounds. She was alert, oriented in no distress. Somewhat cushingoid.  LUNGS: Clear to auscultation.  COR: Normal S1, S2.  ABDOMEN: Soft, tender in left lower quadrant. Normoactive bowel sounds.  No distension.  RECTAL EXAM: With normal rectal tone. No stool. Mucus was heme positive   A 50 year old white female with left-sided ulcerative colitis, resistant  to medical treatment.   PLAN:  1. I have discussed use of Humira in place of Remicade. I think that      it would a medication with hopefully a steroid sparing effect.  We      probably would be able to get off the cort enemas and systemic      steroids which are causing long term side effects.  2. Stay on the same prednisone 7 mg daily.  3. Refills for Prevacid, folic acid, and Asacol.  4. Refill for Vicodin, Darvocet.     [name]. Olevia Perches, Kaitlyn Jennings  Electronically Signed    DMB/MedQ  DD: 03/08/2007  DT: 03/08/2007  Job #: [id number]   cc:   Elio Forget

## 2011-01-21 NOTE — Letter (Signed)
June 14, 2008    Angela Adam, MD  [facility]  Department of [facility] [place], [place] [address]   RE:  Jennings, Kaitlyn  MRN:  [id number]  /  DOB:  01-Oct-1960   Dear Delfino Lovett,   Thank you so much for providing free Humira samples to my patient, Kaitlyn Jennings who has ulcerative colitis.  It was completely unexpected and  quite appreciated by myself and by the patient whom I have followed for  past 15 or 20 years.  She is awaiting approval for Humira from a new  insurance company that she has switched to after she recently got  married.   Again, thank you very much for your generosity.    Sincerely,      Kaitlyn Bandy. Olevia Perches, MD  Electronically Signed    DMB/MedQ  DD: 06/14/2008  DT: 06/14/2008  Job #: [id number]

## 2011-01-21 NOTE — Assessment & Plan Note (Signed)
[name] OFFICE NOTE   Kaitlyn, Jennings                        MRN:          [id number]  DATE:05/14/2007                            DOB:          Feb 28, 1961    [name] is a 50 year old white female with ulcerative  colitis/proctitis positive Prometheus profile.  She has pancreas disease  and chronic anxiety, irritable bowel syndrome, chronic abdominal pain.  She was initially on Remicade treatment for her ulcerative colitis but  currently has switched to Humira which she has been tolerating quite  well.  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  Last colonoscopy in August 2007 showed left-sided  ulcerative colitis.  She has been seen by Dr. Charlestine Night for  polyarthralgias.  She has history of normal liver functions tests  related to medications, currently liver function tests have been normal.  Since her last visit 3 months ago she has done quite well.  She still  has blood intermittently, she has chronic diarrhea but it has been  manageable.  There has been no incontinence.  The abdominal pain is  controlled with Vicodin and Darvocet.  She has stayed within her  prescribed amount of pain medications.  Medications listed in her Cardex  in front of the chart.   PHYSICAL EXAMINATION:  Blood pressure 118/80, pulse 72 and weight 206  pounds which represents 6 pound weight loss since last time.  She has  been exercising daily.  She will be getting married next month.  She is  alert, oriented, no distress.  LUNGS:  Clear to auscultation.  COR:  With normal S1, normal S2.  ABDOMEN:  Soft, nontender with minimal discomfort in epigastrium and  left lower quadrant.  RECTAL:  No stool, mucus Hemoccult negative.   IMPRESSION:  A 50 year old white female with left-sided ulcerative  colitis under reasonable control on multiple medications.   PLAN:  1. Discontinue Cortenemas  but continue Canasa suppositories 1000 mg      every other day.  2. Switch from Fosamax to Boniva 150 mg monthly.  3. Refill for her dicyclomine 20 mg dispensed 90 with 6 refills;      Vicodin dispensed 60; Darvocet N-100 dispensed 120; Asacol 400 mg      dispensed 360 with 3 refills.  Decreased prednisone from 7 mg to 6      mg and down by 1 mg on monthly basis first of each month.  I will      see her again in December 2008.     Kaitlyn Jennings. Kaitlyn Perches, MD  Electronically Signed    DMB/MedQ  DD: 05/14/2007  DT: 05/14/2007  Job #: [id number]   cc:   Kaitlyn Jennings

## 2011-01-21 NOTE — Assessment & Plan Note (Signed)
Kaitlyn Jennings   Kaitlyn Jennings                        MRN:          [id number]  DATE:08/11/2007                            DOB:          1960-09-16    [name] is a 50 year old white female with inflammatory bowel  disease, currently on maximum medial therapy including Humira 40 mg  every other week.  She has done well as far as the diarrhea and  abdominal pain is concerned, but has had some rectal bleeding as a  result of decreasing her steroid dose.  Since September of this year she  has been able to decrease her prednisone from 7 mg to 5.5 mg, going back  half a milligram every month.  She has now noticed rectal bleeding with  almost all stools, but she has no diarrhea.  She has also decreased her  Canasa suppository to twice a week, and Cortenema to twice a week.   OTHER MEDICATIONS:  Asacol 4.8 g a day.   NEW COMPLAINT:  Epigastric pain.  This is different than her usual  abdominal pain.  It is a raw feeling in her abdomen as if in gastritis.  It bothers her when she eats.  She has been on Boniva 150 mg monthly,  and this may be contributing to it.  She is also on dicyclomine 20 mg 3  or 4 times a day for cramps.  She also may have some side effects.   PHYSICAL EXAMINATION:  Blood pressure 122/72, pulse 74, and weight 202  pounds.  She was alert, oriented in no distress.  LUNGS:  Clear to auscultation.  COR:  Normal S1, normal S2.  ABDOMEN:  Soft and minimally tender in the epigastrium.  Lower abdomen  was normal.   Endoscopic exam reveals some friable mucosa of the rectal ampulla , with  some contact bleeding consistent with acute proctitis.  Stool was mucusy  and Hemoccult positive.   IMPRESSION:  1. Ulcerative colitis with proctitis.  2. Epigastric pain, most likely gastritis, which could be related to      Boniva or to Vicodin and Darvocet, possibly due to large doses of  dicyclomine causing delayed gastric emptying.   PLAN:  1. Decrease dicyclomine to 10 mg dose at a time.  2. Add Carafate slurry 2 tsp 4 times a day.  3. Upper endoscopy scheduled.  4. Increase Canasa suppositories 2000 mg daily.  Continue Cortenemas,      continue to taper off      her prednisone.  5. I will see her again in 3 months.     Kaitlyn Jennings. Olevia Perches, MD  Electronically Signed    DMB/MedQ  DD: 08/11/2007  DT: 08/11/2007  Job #: [id number]   cc:   Kaitlyn Jennings

## 2011-01-24 NOTE — Consult Note (Signed)
NAMEWAYNETTE, TOWERS                 ACCOUNT NO.:  [id number]   MEDICAL RECORD NO.:  [id number]          PATIENT TYPE:  AMB   LOCATION:  ENDO                         FACILITY:  [facility]   PHYSICIAN:  Delfin Edis, M.D. [facility]  DATE OF BIRTH:  1961-01-18   DATE OF CONSULTATION:  DATE OF DISCHARGE:  10/14/2005                                   CONSULTATION   INDICATION:  Chronic hoarseness, chronic cough, history of gastroesophageal  reflux, resolved.  __________  probes passed through the lower esophageal  sphincter and was placed 5 and 10 cm above lower esophageal sphincter.  In  proximal channel, there were no reflux episodes.  The pH was never less than  4 and there were no reflux episodes.  In distal channel, there was a total  of 1.1% and pH less than 4 over a period of 24 hours, 8% occurred in  recumbent position and this was abnormal, the total time the pH was less  than 4 was 69 minutes in recumbent position and only 6 minutes in upright  position.  There were 20 reflux episodes in upright position and 78 episodes  in recumbent position.  Total episodes 98.  The longest episode lasted 19  minutes and there were episodes lasting more than 5 minutes, which is all  abnormal.   Composite score analysis showed recumbent time and reflux 8%, which is  abnormal and normal being less than 1.2%, so the distal channel composite  DeMeester's score was 37.9 and normal being less than 22.  Proximal channel  symptom analysis showed most symptoms occurring during _______________  both  in the proximal and distal channels.   IMPRESSION:  Abnormal interesophageal pH probe showing excessive  gastroesophageal reflux mostly when recumbent and postprandial times.      Delfin Edis, M.D. [facility]  Electronically Signed     DB/MEDQ  D:  10/21/2005  T:  10/22/2005  Job:  [id number]   cc:   Hassell Done, M.D.  Otolaryngology  [address] [place] [address]  [place], [place] [address]

## 2011-01-24 NOTE — Letter (Signed)
May 26, 2008    [facility]  PA#:  [id number] [place]:  Kaitlyn Jennings  [address] [place], [place]  [address]   Planned participation ID:  [id number]   RE:  [name], [name]  MRN:  [id number]  /  DOB:  Apr 19, 1961   Dear [name]:   This letter concerns your client and our patient, [name], who I  have treated for inflammatory bowel disease for at least 15 years.  She  has predominantly left-sided ulcerative colitis.  Her disease has been  markedly resistant to conventional medical treatments.  She has been  frequently treated with steroids and most recently with Remicade.  She  developed acute pancreatitis after starting Imuran and 6-mercaptopurine.  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  She was then started on Humira 40 mg every  other week intramuscularly which has worked very well for her.  She has  been for the first time doing reasonably well with her overall daily  functions.  Her symptoms still include diarrhea, urgency, and rectal  bleeding.  Her liver function tests have returned to normal after  discontinuation of her Remicade.  Her Humira,, I understand, has not  been approved for  continuation.  This is quite unfortunate for this patient who has  limited options for treatment of her inflammatory bowel disease.  Knowing that the Humira has caused marked improvement in her condition  and overall function, I am asking for You to reconsider your decision  not to cover  her Humira.  Without your support, she will not be able to  afford this medication.    Sincerely,      [name]. Olevia Perches, MD  Electronically Signed    DMB/MedQ  DD: 05/26/2008  DT: 05/26/2008  Job #: [id number]

## 2011-01-24 NOTE — Assessment & Plan Note (Signed)
[place] OFFICE NOTE   IMANNI, BURDINE                        MRN:          [id number]  DATE:10/20/2006                            DOB:          [birth year]    [name] is a 50 year old woman female with ulcerative colitis.  She  comes today for followup.  She has not been feeling well, mostly in the  epigastrium.  Her rectal bleeding has continued.  She has used up all  her Darvocet, and has taken up to 6 a day, which may be impacting on her  liver function abnormalities.   MEDICATIONS:  1. Asacol  400 mg 6 a day.  2. Prevacid 30 mg p.o. b.i.d.  3. Lexapro 10 mg p.o. daily.  4. Dicyclomine 20 mg up to 4 a day.  5. Imodium p.r.n.  6. Prednisone 9 mg a day.  7. Nasacort.  8. Voltaren  75 mg p.o. daily which was started about 8 weeks ago.  9. Proctocort suppositories nightly.   PHYSICAL EXAMINATION:  Blood pressure 110/76, pulse 74, weight 213  pounds.  Patient appears somewhat cushingoid.  LUNGS:  Clear to auscultation.  COR:  With normal S1, S2.  ABDOMEN:  Soft with tenderness in epigastrium.  Normoactive bowel  sounds.  Lower abdomen was slightly tender.  RECTAL EXAM:  With a hemoccult negative stool.   IMPRESSION:  A 50 year old white female with ulcerative colitis,  currently epigastric pain which started after we initiated the voltaren  therapy.  The epigastric abdominal pain may be correlating with her non-  steroidal anti-inflammatory drug gastropathy.   PLAN:  1. Discontinue voltaren.  2. Continue Prevacid 30 mg p.o. b.i.d.  3. For pain control, we will have to reduce the number of Darvocet she      takes a day because of Tylenol hepatotoxicity.  She will be allowed      to have 60 Darvocet a month, and we will add Vicodin generic 60      tablets.  She could take up to 2 a day.  She also needs her Doctor      First refill on dicyclomine.  4. Decrease prednisone to 8 mg a  day.     [name]. Olevia Perches, MD  Electronically Signed    DMB/MedQ  DD: 10/20/2006  DT: 10/20/2006  Job #: [id number]   cc:   Elio Forget

## 2011-01-24 NOTE — Assessment & Plan Note (Signed)
[place] OFFICE NOTE   Kaitlyn Jennings, CHRONISTER                        MRN:          [id number]  DATE:08/26/2006                            DOB:          Mar 08, 1961    [name] is a 50 year old white female with inflammatory bowel  disease/ulcerative colitis with predominantly proctitis positive  Prometheus profile for inflammatory bowel disease antibodies.  She has  pancreas divisum, irritable bowel syndrome, chronic abdominal pain.  HAD  ALLERGIC REACTION TO REMICADE INFUSIONS MOST LIKELY DUE TO __________  ANTIBODIES.  Last colonoscopy on May 04, 2006 showed active  ulcerative colitis from 0 to 15-cm.  She is on medical therapy which  includes mesalamine 400 mg 12 tablets a day to total of 4.8 grams a day,  Imodium 3 or 4 a day, currently prednisone 10 mg a day since last visit,  Proctocort enemas every other night and Proctocort suppositories also  every other night.  Her symptoms are occasional abdominal pain for which she takes  dicyclomine 20 mg 4 times a day and rectal bleeding with blood on the  toilet tissue.  Her last liver function tests and chemistry were normal  on June 05, 2006 with normal hemoglobin of 12.4, platelets 427,000,  her albumin was slightly low at 3.4.   PHYSICAL EXAMINATION:  Blood pressure 126/82, pulse 84, weight 220  pounds.  She was slightly cushingoid.  LUNGS:  Clear to auscultation.  COR:  Normal S1, normal S2.  ABDOMEN:  Soft, tender over the suprapubic area and left lower quadrant,  normoactive bowel sounds, no distention.  Upper abdomen was  unremarkable.  Her rectal and anoscopy exam showed friable mucosa of the rectum and  __________  with contact bleeding as well as spontaneous bleeding,  granularity, and there was no stool.  Mucus was hemoccult positive.   IMPRESSION:  Ulcerative proctitis, active.  Generally, the patient is  doing reasonably well on low  dose prednisone and mesalamine,  unfortunately the methotrexate had to be discontinued, as well as 6-  mercaptopurine, which caused pancreatitis because of Remicade  antibodies.  She was unable to continue on Remicade.  I would consider  adding Humira to her regimen.  This will all depend whether her appeal  for Medicaid will go through, then she would be able to get on Humira.  Today we will add Voltaren 75 mg daily for her arthralgias.  She is to  see Dr. Charlestine Night next month.     [name]. Olevia Perches, MD  Electronically Signed    DMB/MedQ  DD: 08/26/2006  DT: 08/26/2006  Job #: [id number]   cc:   Avanell Shackleton, M.D.

## 2011-01-24 NOTE — Assessment & Plan Note (Signed)
[place] OFFICE NOTE   OSHA, RANE                        MRN:          [id number]  DATE:06/17/2006                            DOB:          1960/12/27    [name] is a 50 year old white female with ulcerative colitis,  predominantly left-sided, as of last colonoscopy May 04, 2006. She also  has had polyarthritis followed by Dr. Charlestine Night, currently controlled on  methotrexate 10 mg twice weekly.  She is on a tapering dose of prednisone,  currently on 15 mg a day, combined with a Proctocort suppository every other  night and enemas every other night. She has occasional rectal bleeding but  overall has been under reasonable control.   NEW COMPLAINT TODAY:  Urgent bowel movements within 15 minutes  postprandially.  She denies any mucus or gross blood but just a normal bowel  movement which occurs rather urgently as if in irritable bowel syndrome. She  has always taken Imodium up to three a day and dicyclomine 20 mg up to x3  daily. She denies any bowel movements at night. There has been no fever.   PHYSICAL EXAMINATION:  VITAL SIGNS:  Blood pressure 110/76, pulse 88. Weight  216 pounds.  GENERAL:  She appears somewhat cushingoid. Alert and oriented.  LUNGS:  Clear to auscultation.  CARDIAC:  Normal S1, normal S2.  ABDOMEN:  Obese, soft with normal active bowel sounds. Minimal tenderness  across upper abdomen. No tenderness in the left or right lower quadrants.  RECTAL EXAM:  Shows normal rectal tone. Normal perianal area. Stool is  Hemoccult positive.   IMPRESSION:  1. A 50 year old white female with severe left-sided ulcerative colitis      resistant to conservative treatment with steroids. Improvement on      methotrexate.  2. Abnormal liver function tests, normalized on methotrexate. Her liver      function tests 2 weeks ago were entirely normal.   PLAN:  1. Continue Asacol, increase  the dose from 3.6 grams daily to 4.8 grams      daily.  2. Decrease prednisone to 10 mg daily.  3. Continue hydrocortisone enemas and suppositories as currently given      every other day.  4. Patient to see Dr. Charlestine Night next week. Consider decreasing her      methotrexate dose. This could possibly be causing her diarrhea although      other medications could do this such as Prevacid or Fosamax.   I will see her again in 8 weeks.       [name]. Olevia Perches, MD      DMB/MedQ  DD:  06/17/2006  DT:  06/18/2006  Job #:  [id number]   cc:   Lindaann Slough, M.D.  Elio Forget

## 2011-01-24 NOTE — Assessment & Plan Note (Signed)
[facility] OFFICE NOTE   Kaitlyn Jennings                        MRN:          [id number]  DATE:04/23/2006                            DOB:          April 05, 1961    [name] is a 50 year old white female with ulcerative colitis involving  predominantly the left colon, her last colonoscopy in January 2006.  She  also has developed polyarthritis that has been evaluated by Dr. Charlestine Night.  She has currently been started on methotrexate 12.5 mg weekly while we are  trying to decrease the prednisone from 40 mg to currently 20 mg a day.  As  far as her polyarthralgias are concerned, she is followed closely by Dr.  Charlestine Night.  The ulcerative colitis seems to be flaring up again, patient  complaining of daily blood per rectum, urgency and tenesmus.  She also has  noticed a heaviness in the lower chest area, especially when she lies down.  She is now unable to lie flat at night; she is also dyspneic and weak.   MEDICATIONS:  1. Prednisone 20 mg a day.  2. Fosamax 75 mg weekly.  3. Methotrexate 12.5 mg weekly.  4. Dicyclomine 20 mg daily.  5. Prevacid 30 mg p.o. b.i.d.  6. Canasa suppository 1000 mg q.a.m.  7. Lexapro 10 mg daily.  8. Folic acid 1 mg p.o. daily.  9. Asacol 4.8 g daily.   PHYSICAL EXAMINATION:  VITAL SIGNS:  Blood pressure 138/88, pulse 84 and  weight 216 pounds.  GENERAL:  She appeared cushingoid, overweight.  Sclerae are anicteric.  LUNGS:  Fine rales in both bases, no wheezes, no cough.  The patient's voice  was somewhat raspy, but better than on the last time I saw her.  COR:  Rapid S1 and S2, no murmur.  ABDOMEN:  Mildly tender in epigastrium, somewhat obese, also tender in left  lower quadrant.  No rebound.  No distention.  Bowel sounds were normoactive.  RECTAL AND ANOSCOPIC EXAM:  Normal perianal area, normal rectal tone,  friable bleeding mucosa with heavy exudate in the rectal ampulla  consistent  with active proctitis.  EXTREMITIES:  Edema 1+.   IMPRESSION:  1. 34 white female with active ulcerative      proctitis/colitis, not responsive currently to medical therapy.  2. Abnormal liver function tests may be related to previous Remicade      infusion and possible polyarthritis.  3. Polyarthritis, as per Dr. Charlestine Night.  4. Shortness of breath and deconditioning, rule out congestive heart      failure/fluid retention.   PLAN:  1. Chest x-ray, PA and lateral.  2. Start Lasix 20 mg daily.  3. Repeat LFTs today.  4. Cortenemas nightly while continuing her prednisone 20 mg daily.  5. I will see her again in 6 weeks.                                   Kaitlyn Jennings. Kaitlyn Perches, MD   DMB/MedQ  DD:  04/23/2006  DT:  04/23/2006  Job #:  [id number]   cc:   Kaitlyn Slough, MD  Elio Forget

## 2011-02-18 ENCOUNTER — Encounter: Payer: Self-pay | Admitting: Internal Medicine

## 2011-02-18 ENCOUNTER — Ambulatory Visit (INDEPENDENT_AMBULATORY_CARE_PROVIDER_SITE_OTHER): Payer: No Typology Code available for payment source | Admitting: Internal Medicine

## 2011-02-18 VITALS — BP 120/76 | HR 60 | Ht 69.0 in | Wt 225.0 lb

## 2011-02-18 DIAGNOSIS — D849 Immunodeficiency, unspecified: Secondary | ICD-10-CM

## 2011-02-18 DIAGNOSIS — K5100 Ulcerative (chronic) pancolitis without complications: Secondary | ICD-10-CM

## 2011-02-18 DIAGNOSIS — K5901 Slow transit constipation: Secondary | ICD-10-CM

## 2011-02-18 DIAGNOSIS — R195 Other fecal abnormalities: Secondary | ICD-10-CM

## 2011-02-18 MED ORDER — POLYETHYLENE GLYCOL 3350 17 GM/SCOOP PO POWD: ORAL | Status: DC

## 2011-02-18 MED ORDER — SULFASALAZINE 500 MG PO TABS: ORAL_TABLET | ORAL | Status: DC

## 2011-02-18 MED ORDER — FOLIC ACID 1 MG PO TABS: 1.0000 mg | ORAL_TABLET | Freq: Every day | ORAL | Status: AC

## 2011-02-18 MED ORDER — ALIGN PO CAPS: 1.0000 | ORAL_CAPSULE | Freq: Every day | ORAL | Status: AC

## 2011-02-18 MED ORDER — CITALOPRAM HYDROBROMIDE 10 MG PO TABS: 10.0000 mg | ORAL_TABLET | Freq: Every day | ORAL | Status: DC

## 2011-02-18 NOTE — Progress Notes (Signed)
M.Susan Xolani Degracia 07-19-1961 MRN [id number]     History of Present Illness:  This is a 50 year old white female with left sided ulcerative colitis. Her last office visit was in January 2012.  She has had ulcerative colitis since 1988. She had polyarthritis and positive IBD markers. She is on B12 shots for B12 deficiency. 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 She has pancreas divisum and 6 MP induced pancreatitis which has been asymptomatic. Patient has chronic anxiety. Her last upper endoscopy in December 2008 showed gastritis likely related to [facility]. Her last colonoscopy in April 2010 showed mild chronic colitis from 0-20 cm. Her main complaint today is constipation. She denies rectal bleeding.   Past Medical History  Diagnosis Date  . IBS (irritable bowel syndrome)   . Arthritis   . Anxiety   . Pancreatitis   . Ulcerative colitis   . B12 deficiency   . Chronic gastritis    Past Surgical History  Procedure Date  . Breast cyst aspiration     left  . Sinus surgery with instatrak   . Esophagus surgery     reports that she has never smoked. She does not have any smokeless tobacco history on file. She reports that she does not drink alcohol or use illicit drugs. family history includes Diabetes in her maternal grandfather and Irritable bowel syndrome in her father.  There is no history of Colon cancer. Allergies  Allergen Reactions  . Infliximab     REACTION: unspecified        Review of Systems: Denies dysphagia, odynophagia, shortness of breath or chest pain. Denies rectal bleeding. Positive for abdominal pain  The remainder of the 10  point ROS is negative except as outlined in H&P   Physical Exam: General appearance  Well developed, in no distress, overweight. Eyes- non icteric. HEENT nontraumatic, normocephalic. Mouth no lesions, tongue papillated, no cheilosis. Neck supple without adenopathy,  thyroid not enlarged, no carotid bruits, no JVD. Lungs Clear to auscultation bilaterally. Cor normal S1 normal S2, regular rhythm , no murmur,  quiet precordium. Abdomen obese soft. Very tender in the left lower and left middle quadrant. Normoactive bowel sounds. Rectal: Hemoccult-positive stool. Extremities no pedal edema. Skin no lesions. Neurological alert and oriented x 3. Psychological normal mood and affect.  Assessment and Plan:  Problem #1 ulcerative colitis, pedominantly left-sided. Patient is in symptomatic remission. She is to continue Humira 40 mg every 2 weeks. We have switched patient from Asacol to sulfasalazine due to cost. She will take 500mg , 2 po bid. She will take folic acid 1 mg daily as long as she is on the sulfasalazine. She is up-to-date on her colonoscopy.  Problem #2 constipation. This is a new symptom. She will start on MiraLax 17 g daily and titrate the dose to adjust to her bowel habits. I emphasized a high-fiber diet and gave her samples of a probiotic. She will stop the Bentyl 20 mg 4 times a day because it can cause constipation.  Problem #3 anxiety. She wants to switch from Lexapro to Celexa 10 mg daily. We will provide a new prescription. She will stop tramadol because it interacts with Celexa, causing possible serotonin syndrome.  Problem #4 gastroesophageal reflux. I advised her to decrease Nexium to 40 mg daily.   02/18/2011 [name]

## 2011-02-18 NOTE — Patient Instructions (Addendum)
We have sent a prescription to your pharmacy for Celexa 10 mg. You should take 1 tablet by mouth once daily in place of Lexapro. STOP taking Tramadol while taking Celexa due to possible serotonin syndrome if taken together! We have sent a prescription to your pharmacy for sulfasalazine 500 mg in place of Asacol. You should take 2 tablets by mouth twice daily. We have sent a prescription for folic acid 1 mg to your pharmacy. You should take 1 tablet daily. We have sent a prescription of Miralax to your pharmacy. You should take 1-1.5 capfuls dissolved in at least 8 ounces of water/juice and drink once daily. We have given you samples of Align. This puts good bacteria back into your intestines. You should take 1 capsule by mouth once daily. If this works well for you, it can be purchased over the counter. Decrease Nexium to 1 tablet once daily. Please follow up with Dr Juanda Chance in 6 months. CC:Dr Bing Matter

## 2011-03-11 ENCOUNTER — Other Ambulatory Visit: Payer: Self-pay | Admitting: Internal Medicine

## 2011-03-13 ENCOUNTER — Other Ambulatory Visit: Payer: Self-pay | Admitting: *Deleted

## 2011-03-13 MED ORDER — HYDROCODONE-ACETAMINOPHEN 5-500 MG PO TABS: ORAL_TABLET | ORAL | Status: DC

## 2011-03-13 NOTE — Telephone Encounter (Signed)
rx sent

## 2011-03-25 ENCOUNTER — Other Ambulatory Visit: Payer: Self-pay | Admitting: Internal Medicine

## 2011-03-25 MED ORDER — ADALIMUMAB 40 MG/0.8ML ~~LOC~~ KIT: PACK | SUBCUTANEOUS | Status: DC

## 2011-03-25 NOTE — Telephone Encounter (Signed)
Faxed to [facility]. Patient advised.

## 2011-05-15 ENCOUNTER — Other Ambulatory Visit: Payer: Self-pay | Admitting: Internal Medicine

## 2011-07-14 ENCOUNTER — Other Ambulatory Visit: Payer: Self-pay | Admitting: Internal Medicine

## 2011-07-18 ENCOUNTER — Ambulatory Visit: Payer: No Typology Code available for payment source | Admitting: Internal Medicine

## 2011-08-12 ENCOUNTER — Other Ambulatory Visit: Payer: Self-pay | Admitting: Internal Medicine

## 2011-08-13 ENCOUNTER — Ambulatory Visit: Payer: No Typology Code available for payment source | Admitting: Internal Medicine

## 2011-08-28 ENCOUNTER — Ambulatory Visit (INDEPENDENT_AMBULATORY_CARE_PROVIDER_SITE_OTHER): Payer: No Typology Code available for payment source | Admitting: Internal Medicine

## 2011-08-28 ENCOUNTER — Encounter: Payer: Self-pay | Admitting: Internal Medicine

## 2011-08-28 VITALS — BP 128/76 | HR 60 | Ht 69.0 in | Wt 232.0 lb

## 2011-08-28 DIAGNOSIS — R195 Other fecal abnormalities: Secondary | ICD-10-CM

## 2011-08-28 DIAGNOSIS — K5100 Ulcerative (chronic) pancolitis without complications: Secondary | ICD-10-CM

## 2011-08-28 MED ORDER — TRAMADOL HCL 50 MG PO TABS
50.0000 mg | ORAL_TABLET | Freq: Two times a day (BID) | ORAL | Status: AC
Start: 2011-08-28 — End: 2012-08-27

## 2011-08-28 MED ORDER — MESALAMINE 400 MG PO TBEC: DELAYED_RELEASE_TABLET | ORAL | Status: DC

## 2011-08-28 MED ORDER — PREDNISONE 5 MG PO TABS: ORAL_TABLET | ORAL | Status: DC

## 2011-08-28 NOTE — Progress Notes (Signed)
Kaitlyn Jennings 1960/11/24 MRN [id number]    History of Present Illness:  This is a 50 year old white female with left-sided ulcerative colitis since 1988. Her last office visit was in June 2012. She has polyarthritis, positive IBD markers and B12 deficiency. 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 She is currently on Humira 40 mg every 2 weeks. She takes prednisone 5 mg one day prior to Humira and up to 3 days afterward.  She is on Nexium for gastroesophageal reflux. She is allergic to 6 MP and Imuran which caused pancreatitis. She has a history of pancreas divisum. She has chronic anxiety. Her last colonoscopy in April 2010 showed mild chronic colitis from 0-20 cm. An upper endoscopy in December 2008 showed gastritis secondary to [facility]. She had a recent complete physical exam but Dr.Jobe.    Past Medical History  Diagnosis Date  . IBS (irritable bowel syndrome)   . Arthritis   . Anxiety   . Pancreatitis   . Ulcerative colitis   . B12 deficiency   . Chronic gastritis    Past Surgical History  Procedure Date  . Breast cyst aspiration     left  . Sinus surgery with instatrak   . Esophagus surgery     reports that she has never smoked. She has never used smokeless tobacco. She reports that she does not drink alcohol or use illicit drugs. family history includes Diabetes in her maternal grandfather and Irritable bowel syndrome in her father.  There is no history of Colon cancer. Allergies  Allergen Reactions  . Infliximab     REACTION: unspecified        Review of Systems:He denies dysphagia, odynophagia. Positive for hoarseness and upper respiratory infections   The remainder of the 10 point ROS is negative except as outlined in H&P   Physical Exam: General appearance  Well developed, in no distress. Eyes- non icteric. HEENT nontraumatic, normocephalic. Mouth no lesions, tongue papillated, no cheilosis. Neck supple without  adenopathy, thyroid not enlarged, no carotid bruits, no JVD. Lungs Clear to auscultation bilaterally. Cor normal S1, normal S2, regular rhythm, no murmur,  quiet precordium. Abdomen: Obese soft abdomen with normal active bowel sounds. Mild tenderness left lower quadrant. No rebound.  Rectal:Hemoccult positive yellow stool. No gross blood  Extremities no pedal edema. Skin no lesions. Neurological alert and oriented x 3. Psychological normal mood and affect.  Assessment and Plan:  Problem #1 Left-sided ulcerative colitis under reasonable control. She is to continue on Humira as well as mesalamine. She will be switching from sulfasalazine to Asacol starting January at 3.6 g daily. We will renew her prednisone, Vicodin and tramadol. We will obtain results of the blood tests from Dr.Jobe as well. She will be due for a return office visit in 6 months.   08/28/2011 Kaitlyn Jennings

## 2011-08-28 NOTE — Patient Instructions (Signed)
We have sent the following medications to your pharmacy for you to pick up at your convenience: Prednisone Asacol  Tramadol We will get labs from Dr Derrell Lolling today. Please follow up with Dr Juanda Chance in 6 months. CC: Dr Derrell Lolling

## 2011-08-29 ENCOUNTER — Other Ambulatory Visit: Payer: Self-pay | Admitting: Internal Medicine

## 2011-08-29 NOTE — Telephone Encounter (Signed)
Patient states that Dr Juanda Chance offered to give her a zpak and she forgot about it. Dr Juanda Chance, do you want me to send zpak?

## 2011-08-30 NOTE — Telephone Encounter (Signed)
Yes, I offered but she  Was nor sure. OK to send Z-pack 1 po dq as per pack instructions.

## 2011-09-01 MED ORDER — AZITHROMYCIN 250 MG PO TABS: ORAL_TABLET | ORAL | Status: DC

## 2011-09-01 NOTE — Telephone Encounter (Signed)
Rx sent to pharmacy   

## 2011-09-08 ENCOUNTER — Telehealth: Payer: Self-pay | Admitting: Internal Medicine

## 2011-09-08 NOTE — Telephone Encounter (Signed)
TB test scheduled for 09/16/11

## 2011-09-10 ENCOUNTER — Other Ambulatory Visit: Payer: Self-pay | Admitting: Internal Medicine

## 2011-09-30 ENCOUNTER — Ambulatory Visit (INDEPENDENT_AMBULATORY_CARE_PROVIDER_SITE_OTHER): Payer: Medicare Other | Admitting: Internal Medicine

## 2011-09-30 DIAGNOSIS — K5190 Ulcerative colitis, unspecified, without complications: Secondary | ICD-10-CM

## 2011-09-30 MED ORDER — SUCRALFATE 1 GM/10ML PO SUSP: ORAL | Status: DC

## 2011-09-30 MED ORDER — ESOMEPRAZOLE MAGNESIUM 40 MG PO CPDR: 40.0000 mg | DELAYED_RELEASE_CAPSULE | Freq: Every day | ORAL | Status: DC

## 2011-10-02 LAB — TB SKIN TEST: TB Skin Test: NEGATIVE mm

## 2011-10-06 ENCOUNTER — Telehealth: Payer: Self-pay | Admitting: Internal Medicine

## 2011-10-06 NOTE — Telephone Encounter (Signed)
Patient states that she was denied for Humira but will be applying with the [place]. I have advised that I will fill out any needed information as soon as I get it.

## 2011-10-21 ENCOUNTER — Telehealth: Payer: Self-pay | Admitting: Internal Medicine

## 2011-10-21 NOTE — Telephone Encounter (Signed)
Patient will bring jury summons to the office tomorrow and we will work on a note at that time.

## 2011-10-22 ENCOUNTER — Telehealth: Payer: Self-pay | Admitting: *Deleted

## 2011-10-22 NOTE — Telephone Encounter (Signed)
Patient called to state that she has gotten a Dentist and needs an excuse. We have created the letter and Dr Olevia Perches will sign it.

## 2011-11-06 ENCOUNTER — Other Ambulatory Visit: Payer: Self-pay | Admitting: Internal Medicine

## 2011-11-26 ENCOUNTER — Other Ambulatory Visit: Payer: Self-pay | Admitting: Internal Medicine

## 2011-11-27 ENCOUNTER — Encounter: Payer: Self-pay | Admitting: Internal Medicine

## 2011-11-27 NOTE — Progress Notes (Signed)
===  View-only below this line===  ----- Message -----    From: Lafayette Dragon, MD    Sent: 11/27/2011  10:52 AM      To: Larina Bras, CMA Subject: refill                                         Please refill it for 1 tab/day. BD ----- Message -----    From: Larina Bras, CMA    Sent: 11/27/2011   8:23 AM      To: Lafayette Dragon, MD  Dr Olevia Perches- I have a refill request for patient to have citalopram 1/2 tablet daily. However, I cannot see where we ever sent an rx for 1/2 tablet daily, just 1 tablet daily. Do you want me to okay the 1/2 tablet daily or does she need to be taking 1 tablet daily??

## 2012-01-05 ENCOUNTER — Other Ambulatory Visit: Payer: Self-pay | Admitting: Internal Medicine

## 2012-02-07 ENCOUNTER — Other Ambulatory Visit: Payer: Self-pay | Admitting: Internal Medicine

## 2012-02-10 ENCOUNTER — Ambulatory Visit: Payer: Medicare Other | Admitting: Internal Medicine

## 2012-02-25 ENCOUNTER — Other Ambulatory Visit: Payer: Self-pay | Admitting: Internal Medicine

## 2012-03-09 ENCOUNTER — Encounter: Payer: Self-pay | Admitting: Internal Medicine

## 2012-03-09 ENCOUNTER — Ambulatory Visit (INDEPENDENT_AMBULATORY_CARE_PROVIDER_SITE_OTHER): Payer: Medicare Other | Admitting: Internal Medicine

## 2012-03-09 ENCOUNTER — Other Ambulatory Visit (INDEPENDENT_AMBULATORY_CARE_PROVIDER_SITE_OTHER): Payer: Medicare Other

## 2012-03-09 VITALS — BP 120/72 | HR 88 | Ht 69.0 in | Wt 235.0 lb

## 2012-03-09 DIAGNOSIS — K5190 Ulcerative colitis, unspecified, without complications: Secondary | ICD-10-CM

## 2012-03-09 DIAGNOSIS — D849 Immunodeficiency, unspecified: Secondary | ICD-10-CM

## 2012-03-09 DIAGNOSIS — Z79899 Other long term (current) drug therapy: Secondary | ICD-10-CM

## 2012-03-09 LAB — CBC WITH DIFFERENTIAL/PLATELET
Basophils Relative: 0.5 % (ref 0.0–3.0)
Eosinophils Relative: 2.4 % (ref 0.0–5.0)
HCT: 38.9 % (ref 36.0–46.0)
Hemoglobin: 12.9 g/dL (ref 12.0–15.0)
Lymphs Abs: 1.7 10*3/uL (ref 0.7–4.0)
MCV: 84.5 fl (ref 78.0–100.0)
Monocytes Absolute: 0.4 10*3/uL (ref 0.1–1.0)
Monocytes Relative: 4.9 % (ref 3.0–12.0)
Neutro Abs: 6.6 10*3/uL (ref 1.4–7.7)
Platelets: 374 10*3/uL (ref 150.0–400.0)
WBC: 9 10*3/uL (ref 4.5–10.5)

## 2012-03-09 LAB — COMPREHENSIVE METABOLIC PANEL
Alkaline Phosphatase: 62 U/L (ref 39–117)
BUN: 11 mg/dL (ref 6–23)
CO2: 24 mEq/L (ref 19–32)
Creatinine, Ser: 0.6 mg/dL (ref 0.4–1.2)
GFR: 105.63 mL/min (ref >60.00)
Glucose, Bld: 109 mg/dL — ABNORMAL HIGH (ref 70–99)
Sodium: 139 mEq/L (ref 135–145)
Total Bilirubin: 0.3 mg/dL (ref 0.3–1.2)

## 2012-03-09 LAB — SEDIMENTATION RATE: Sed Rate: 30 mm/hr — ABNORMAL HIGH (ref 0–22)

## 2012-03-09 MED ORDER — CITALOPRAM HYDROBROMIDE 20 MG PO TABS: 20.0000 mg | ORAL_TABLET | Freq: Every day | ORAL | Status: DC

## 2012-03-09 MED ORDER — FOLIC ACID 1 MG PO TABS: 1.0000 mg | ORAL_TABLET | Freq: Every day | ORAL | Status: AC

## 2012-03-09 MED ORDER — HYDROCODONE-ACETAMINOPHEN 5-500 MG PO TABS: 1.0000 | ORAL_TABLET | Freq: Two times a day (BID) | ORAL | Status: DC

## 2012-03-09 MED ORDER — SULFASALAZINE 500 MG PO TABS: ORAL_TABLET | ORAL | Status: DC

## 2012-03-09 MED ORDER — PROMETHAZINE HCL 25 MG PO TABS: 25.0000 mg | ORAL_TABLET | Freq: Four times a day (QID) | ORAL | Status: DC | PRN

## 2012-03-09 NOTE — Progress Notes (Signed)
Kaitlyn Jennings [birth year] MRN [id number]    History of Present Illness:  This is a 51 year old white female with predominantly left-sided ulcerative colitis since 1988, polyarthritis, B12 deficiency and Imuran-induced pancreatitis. She has positive IBD markers, chronic anxiety and Remicade induced lupus like syndrome. She is currently on Humira 40 mg every 2 weeks. She takes prednisone 5 mg for 3 days following her Humira injection. She has pancreas divisum. Her last colonoscopy in April 2010 showed mild chronic colitis from 0-20 cm. An upper endoscopy in December 2008 showed gastritis secondary to [facility]. She is doing reasonably well except for being overweight and having low back pain. She has chronic abdominal pain for which she takes Vicodin 1-2 times a day.  Past Medical History  Diagnosis Date  . IBS (irritable bowel syndrome)   . Arthritis   . Anxiety   . Pancreatitis   . Ulcerative colitis   . B12 deficiency   . Chronic gastritis    Past Surgical History  Procedure Date  . Breast cyst aspiration     left  . Sinus surgery with instatrak   . Esophagus surgery     reports that she has never smoked. She has never used smokeless tobacco. She reports that she does not drink alcohol or use illicit drugs. family history includes Diabetes in her maternal grandfather and Irritable bowel syndrome in her father.  There is no history of Colon cancer. Allergies  Allergen Reactions  . Infliximab     REACTION: unspecified        Review of Systems: Positive for burning epigastric pain. Negative for diarrhea or rectal bleeding  The remainder of the 10 point ROS is negative except as outlined in H&P   Physical Exam: General appearance  Well developed, in no distress. Eyes- non icteric. HEENT nontraumatic, normocephalic. Mouth no lesions, tongue papillated, no cheilosis. Neck supple without adenopathy, thyroid not enlarged, no carotid bruits, no JVD. Lungs Clear to auscultation  bilaterally. Cor normal S1, normal S2, regular rhythm, no murmur,  quiet precordium. Abdomen: Mild diffuse tenderness predominantly in the epigastrium. Rectal: Soft Hemoccult negative stool. Extremities no pedal edema. Skin no lesions. Neurological alert and oriented x 3. Psychological normal mood and affect.  Assessment and Plan:  Problem #1 Ulcerative colitis in remission on Humira and Mesalamine. She also takes Bentyl for cramps. She will be switching to Azulfidine because of insurance. She will take 1 gm  twice a day and folic acid 1 mg daily. She will also go up on her Celexa to 20 mg daily and continue her probiotic. She is due for a recall colonoscopy but she wants to wait another year or 2 for a total of 5 years.  Problem #2 Gastroesophageal reflux. Patient takes carafate in addition to Nexium 40 mg daily. She will need Gaviscon when necessary.  Problem #3 Low back pain previously evaluated by a rheumatologist as part of her Crohn's disease. She will try to stop prednisone following Humira injections in attempt to lose more weight.  Problem #4 Refill Phenergan and Vicodin today. We also get a blood count ,metabolic panel and sedimentation rate. She is to continue B12 supplements.   03/09/2012 Delfin Edis

## 2012-03-09 NOTE — Patient Instructions (Addendum)
Your physician has requested that you go to the basement for the following lab work before leaving today: CBC, CMET, Sed Rate We have sent the following medications to your pharmacy for you to pick up at your convenience: Promethazine Azulfidine Folic Acid Celexa 20 mg daily  Vicodin Follow up with Dr Olevia Perches in 6 months. CC: Dr Tiana Loft

## 2012-03-10 ENCOUNTER — Encounter: Payer: Self-pay | Admitting: *Deleted

## 2012-03-10 NOTE — Telephone Encounter (Signed)
Error

## 2012-06-16 ENCOUNTER — Other Ambulatory Visit: Payer: Self-pay | Admitting: Internal Medicine

## 2012-07-09 ENCOUNTER — Telehealth: Payer: Self-pay | Admitting: Internal Medicine

## 2012-07-09 NOTE — Telephone Encounter (Signed)
We have not received the records. Patient to bring them to medical records.

## 2012-07-29 ENCOUNTER — Encounter: Payer: Self-pay | Admitting: Internal Medicine

## 2012-07-29 NOTE — Telephone Encounter (Signed)
Error

## 2012-08-18 ENCOUNTER — Other Ambulatory Visit: Payer: Self-pay | Admitting: *Deleted

## 2012-08-18 ENCOUNTER — Ambulatory Visit (INDEPENDENT_AMBULATORY_CARE_PROVIDER_SITE_OTHER): Payer: Medicare Other | Admitting: Internal Medicine

## 2012-08-18 ENCOUNTER — Other Ambulatory Visit (INDEPENDENT_AMBULATORY_CARE_PROVIDER_SITE_OTHER): Payer: Medicare Other

## 2012-08-18 ENCOUNTER — Encounter: Payer: Self-pay | Admitting: Internal Medicine

## 2012-08-18 VITALS — BP 132/64 | HR 80 | Ht 69.0 in | Wt 228.0 lb

## 2012-08-18 DIAGNOSIS — K5190 Ulcerative colitis, unspecified, without complications: Secondary | ICD-10-CM

## 2012-08-18 DIAGNOSIS — D849 Immunodeficiency, unspecified: Secondary | ICD-10-CM

## 2012-08-18 DIAGNOSIS — R7309 Other abnormal glucose: Secondary | ICD-10-CM

## 2012-08-18 DIAGNOSIS — Z79899 Other long term (current) drug therapy: Secondary | ICD-10-CM

## 2012-08-18 LAB — CBC WITH DIFFERENTIAL/PLATELET
Basophils Absolute: 0.1 10*3/uL (ref 0.0–0.1)
Basophils Relative: 0.7 % (ref 0.0–3.0)
Eosinophils Relative: 1.2 % (ref 0.0–5.0)
HCT: 36.8 % (ref 36.0–46.0)
Hemoglobin: 12.3 g/dL (ref 12.0–15.0)
Lymphocytes Relative: 36.1 % (ref 12.0–46.0)
Monocytes Relative: 6.5 % (ref 3.0–12.0)
Neutro Abs: 4.8 10*3/uL (ref 1.4–7.7)
RBC: 4.38 Mil/uL (ref 3.87–5.11)
RDW: 13.2 % (ref 11.5–14.6)
WBC: 8.7 10*3/uL (ref 4.5–10.5)

## 2012-08-18 LAB — COMPREHENSIVE METABOLIC PANEL
ALT: 19 U/L (ref 0–35)
AST: 21 U/L (ref 0–37)
Calcium: 9.1 mg/dL (ref 8.4–10.5)
Chloride: 103 mEq/L (ref 96–112)
Creatinine, Ser: 0.7 mg/dL (ref 0.4–1.2)
Sodium: 141 mEq/L (ref 135–145)
Total Protein: 7.4 g/dL (ref 6.0–8.3)

## 2012-08-18 LAB — IBC PANEL
Saturation Ratios: 13.8 % — ABNORMAL LOW (ref 20.0–50.0)
Transferrin: 289.6 mg/dL (ref 212.0–360.0)

## 2012-08-18 MED ORDER — MESALAMINE 1000 MG RE SUPP: 1000.0000 mg | Freq: Every day | RECTAL | Status: DC

## 2012-08-18 MED ORDER — HYDROCODONE-ACETAMINOPHEN 5-500 MG PO TABS: 1.0000 | ORAL_TABLET | Freq: Two times a day (BID) | ORAL | Status: DC

## 2012-08-18 MED ORDER — PREDNISONE 5 MG PO TABS: ORAL_TABLET | ORAL | Status: DC

## 2012-08-18 NOTE — Progress Notes (Signed)
Kaitlyn Jennings June 30, 1961 MRN [id number]   History of Present Illness:  This is a 51 year old white female with predominantly left sided ulcerative colitis since 1988. She has history of chronic abdominal pain, polyarthritis, B12 deficiency, and imuran-induced pancreatitis. She has positive IBD markers. She had Remicade induced lupus syndrome but she is currently tolerating Humira 40 mg every 2 weeks. She is also on mesalamine, Bentyl, Canasa suppositories when necessary and prednisone 5 mg for 3 days following Humira injections. She reports a low-grade temperature for 3 days following a Humira injection.  She has pancreas divisum. Her last colonoscopy in April 2010 showed chronic active colitis from 0-20 cm. She is on full disability. Her last upper endoscopy in December 2008 showed gastritis. She is doing reasonably well except for increased rectal bleeding with stools. She denies diarrhea or constipation. She takes probiotics which help her to regulate her bowel habits.   Past Medical History  Diagnosis Date  . IBS (irritable bowel syndrome)   . Arthritis   . Anxiety   . Pancreatitis   . Ulcerative colitis   . B12 deficiency   . Chronic gastritis    Past Surgical History  Procedure Date  . Breast cyst aspiration     left  . Sinus surgery with instatrak   . Esophagus surgery     reports that she has never smoked. She has never used smokeless tobacco. She reports that she does not drink alcohol or use illicit drugs. family history includes Diabetes in her maternal grandfather and Irritable bowel syndrome in her father.  There is no history of Colon cancer. Allergies  Allergen Reactions  . Infliximab     REACTION: unspecified        Review of Systems: Denies heartburn dysphagia  The remainder of the 10 point ROS is negative except as outlined in H&P   Physical Exam: General appearance  Well developed, in no distress. Overweight Eyes- non icteric. HEENT nontraumatic,  normocephalic. Mouth no lesions, tongue papillated, no cheilosis. Neck supple without adenopathy, thyroid not enlarged, no carotid bruits, no JVD. Lungs Clear to auscultation bilaterally. Cor normal S1, normal S2, regular rhythm, no murmur,  quiet precordium. Abdomen: Soft minimally tender in epigastrium. Normal active bowel sounds. No distention. Liver edge at costal margin. Rectal: And anoscopic exam reveals saw friable bleeding mucosa of the rectum up to 5 cm consistent with proctitis. Extremities no pedal edema. Skin no lesions. Neurological alert and oriented x 3. Psychological normal mood and affect.  Assessment and Plan  Problem #1 Inflammatory bowel disease consistent with left-sided ulcerative colitis. It is currently active in the rectum. She will increase her Canasa suppositories to use every night and continue all other medications. She needs a refill on her Vicodin, prednisone and Canasa suppositories. She will also need a TB skin test today. We are checking her blood count, iron studies and metabolic panel today. I will see her in 6 months. She will be due for a recall colonoscopy in April 2014.   08/18/2012 Kaitlyn Jennings

## 2012-08-18 NOTE — Patient Instructions (Addendum)
We have sent the following medications to your pharmacy for you to pick up at your convenience: Canasa Vicodin Prednisone  Your physician has requested that you go to the basement for the following lab work before leaving today: CBC, Sed Rate, IBC, CMET  We have given you a TB skin test today. Please make certain to come back to the office for a reading between 48-72 hours from now to avoid requiring repeat testing.  You will be due for a recall colonoscopy in 12/2012. We will send you a reminder in the mail when it gets closer to that time.  CC: Dr Synetta Fail

## 2012-08-20 ENCOUNTER — Telehealth: Payer: Self-pay | Admitting: *Deleted

## 2012-08-20 ENCOUNTER — Telehealth: Payer: Self-pay | Admitting: Internal Medicine

## 2012-08-20 ENCOUNTER — Other Ambulatory Visit (INDEPENDENT_AMBULATORY_CARE_PROVIDER_SITE_OTHER): Payer: Medicare Other

## 2012-08-20 DIAGNOSIS — R7309 Other abnormal glucose: Secondary | ICD-10-CM

## 2012-08-20 LAB — TB SKIN TEST
Induration: 0 mm
TB Skin Test: NEGATIVE

## 2012-08-20 MED ORDER — HYDROCODONE-ACETAMINOPHEN 5-325 MG PO TABS: 1.0000 | ORAL_TABLET | Freq: Two times a day (BID) | ORAL | Status: DC

## 2012-08-20 NOTE — Telephone Encounter (Signed)
Pharmacy sent fax stating that Vicodin 5-500 is no longer available (due to the amount of acetaminophen). Therefore, Dr Juanda Chance has okayed Norco 5-325. New script sent to pharmacy.

## 2012-08-20 NOTE — Telephone Encounter (Signed)
Test read

## 2012-10-26 ENCOUNTER — Other Ambulatory Visit: Payer: Self-pay | Admitting: Internal Medicine

## 2012-10-27 ENCOUNTER — Other Ambulatory Visit: Payer: Self-pay | Admitting: Internal Medicine

## 2012-10-28 ENCOUNTER — Telehealth: Payer: Self-pay | Admitting: Internal Medicine

## 2012-10-28 MED ORDER — SUCRALFATE 1 GM/10ML PO SUSP: ORAL | Status: DC

## 2012-10-28 NOTE — Telephone Encounter (Signed)
Rx sent to pharmacy. Patient notified. 

## 2012-10-28 NOTE — Telephone Encounter (Signed)
Patient calling because she likes to keep a bottle of Carafate on hand for occasional stomach upset. States she uses Pepto bismol usually but likes to keep Carafate on hand. Please, advise.

## 2012-10-28 NOTE — Telephone Encounter (Signed)
OK to refill Carafate ??slurry 10cc po bid, 12 oz, 1 refill

## 2012-11-10 ENCOUNTER — Telehealth: Payer: Self-pay | Admitting: Internal Medicine

## 2012-11-10 NOTE — Telephone Encounter (Signed)
Noted  

## 2012-11-15 ENCOUNTER — Telehealth: Payer: Self-pay | Admitting: Internal Medicine

## 2012-11-15 NOTE — Telephone Encounter (Signed)
Patient advised that form was filled out last week and was faxed to [facility]. She verbalizes understanding.

## 2012-11-26 ENCOUNTER — Telehealth: Payer: Self-pay | Admitting: Internal Medicine

## 2012-11-26 MED ORDER — ADALIMUMAB 40 MG/0.8ML ~~LOC~~ KIT: PACK | SUBCUTANEOUS | Status: DC

## 2012-11-26 NOTE — Telephone Encounter (Signed)
RX sent

## 2012-11-29 ENCOUNTER — Telehealth: Payer: Self-pay | Admitting: Internal Medicine

## 2012-11-29 NOTE — Telephone Encounter (Signed)
Advised patient that this was already approved on Friday, 11/26/12. Gave patient PA reference number and she will call insurance back regarding this.

## 2012-12-01 ENCOUNTER — Encounter: Payer: Self-pay | Admitting: Internal Medicine

## 2012-12-22 ENCOUNTER — Other Ambulatory Visit: Payer: Self-pay | Admitting: Internal Medicine

## 2013-02-09 ENCOUNTER — Ambulatory Visit (INDEPENDENT_AMBULATORY_CARE_PROVIDER_SITE_OTHER): Payer: Medicare Other | Admitting: Internal Medicine

## 2013-02-09 ENCOUNTER — Other Ambulatory Visit (INDEPENDENT_AMBULATORY_CARE_PROVIDER_SITE_OTHER): Payer: Medicare Other

## 2013-02-09 ENCOUNTER — Encounter: Payer: Self-pay | Admitting: Internal Medicine

## 2013-02-09 ENCOUNTER — Ambulatory Visit (INDEPENDENT_AMBULATORY_CARE_PROVIDER_SITE_OTHER)
Admission: RE | Admit: 2013-02-09 | Discharge: 2013-02-09 | Disposition: A | Discharge status: Home / Self Care | Payer: Medicare Other | Source: Ambulatory Visit | Attending: Internal Medicine | Admitting: Internal Medicine

## 2013-02-09 VITALS — BP 126/88 | HR 96 | Ht 67.5 in | Wt 228.5 lb

## 2013-02-09 DIAGNOSIS — Z79899 Other long term (current) drug therapy: Secondary | ICD-10-CM

## 2013-02-09 DIAGNOSIS — K5150 Left sided colitis without complications: Secondary | ICD-10-CM

## 2013-02-09 DIAGNOSIS — D849 Immunodeficiency, unspecified: Secondary | ICD-10-CM

## 2013-02-09 LAB — COMPREHENSIVE METABOLIC PANEL
ALT: 8 U/L (ref 0–35)
Alkaline Phosphatase: 57 U/L (ref 39–117)
Creatinine, Ser: 0.7 mg/dL (ref 0.4–1.2)
Glucose, Bld: 96 mg/dL (ref 70–99)
Sodium: 141 mEq/L (ref 135–145)
Total Bilirubin: 0.4 mg/dL (ref 0.3–1.2)
Total Protein: 7.1 g/dL (ref 6.0–8.3)

## 2013-02-09 LAB — CBC WITH DIFFERENTIAL/PLATELET
Basophils Absolute: 0 10*3/uL (ref 0.0–0.1)
HCT: 36.3 % (ref 36.0–46.0)
Lymphs Abs: 1.6 10*3/uL (ref 0.7–4.0)
MCV: 83 fl (ref 78.0–100.0)
Monocytes Absolute: 0.4 10*3/uL (ref 0.1–1.0)
Platelets: 337 10*3/uL (ref 150.0–400.0)
RDW: 13.2 % (ref 11.5–14.6)

## 2013-02-09 LAB — SEDIMENTATION RATE: Sed Rate: 30 mm/hr — ABNORMAL HIGH (ref 0–22)

## 2013-02-09 MED ORDER — PREDNISONE 10 MG PO TABS: 10.0000 mg | ORAL_TABLET | Freq: Every day | ORAL | Status: DC

## 2013-02-09 MED ORDER — HYDROCODONE-ACETAMINOPHEN 5-325 MG PO TABS: ORAL_TABLET | ORAL | Status: DC

## 2013-02-09 NOTE — Patient Instructions (Addendum)
You will go to the basement for labs today You have a bone density scan schedueld today after your appointment with Dr Juanda Chance Dr Bing Matter, check if you had a Pneumovaccine

## 2013-02-09 NOTE — Progress Notes (Signed)
Kaitlyn Jennings [birth year] MRN [id number]        History of Present Illness:  This is a 52 year old white female with left-sided ulcerative colitis since 1988. Chronic abdominal pain. Polyarthritis. Imuran-induced pancreatitis.Remicade antibodies. She has chronic anxiety and depression. Currently she is on Humira 40 mg every 2 weeks but complains of joint pains and fever for four days after she takes Humira. The colitis is in remission. She denies rectal bleeding. She stopped taking cort enemas and Canasa suppositories. She will be due for recall colonoscopy in April 2015.   Past Medical History  Diagnosis Date  . IBS (irritable bowel syndrome)   . Arthritis   . Anxiety   . Pancreatitis   . Ulcerative colitis   . B12 deficiency   . Chronic gastritis    Past Surgical History  Procedure Laterality Date  . Breast cyst aspiration      left  . Sinus surgery with instatrak    . Esophagus surgery      reports that she has never smoked. She has never used smokeless tobacco. She reports that she does not drink alcohol or use illicit drugs. family history includes Diabetes in her maternal grandfather and Irritable bowel syndrome in her father.  There is no history of Colon cancer. Allergies  Allergen Reactions  . Molds & Smuts   . Remicade (Infliximab)     REACTION: unspecified        Review of Systems: Exacerbation of asthma. Denies abdominal pain  The remainder of the 10 point ROS is negative except as outlined in H&P   Physical Exam: General appearance  Well developed, in no distress. Eyes- non icteric. HEENT nontraumatic, normocephalic. Mouth no lesions, tongue papillated, no cheilosis. Neck supple without adenopathy, thyroid not enlarged, no carotid bruits, no JVD. Lungs Clear to auscultation bilaterally. Expiratory wheezes Cor normal S1, normal S2, regular rhythm, no murmur,  quiet precordium. Abdomen: Soft nontender with normoactive bowel sounds. Epigastric  discomfort Rectal: Soft Hemoccult negative stool Extremities no pedal edema. Skin no lesions. Neurological alert and oriented x 3. Psychological normal mood and affect.  Assessment and Plan:  Left Sided ulcerative colitis in remission. We will decrease Humira to 40 mg every 4 weeks and increase the prednisone  To 20 mg daily for 3 days following Humira injection  Asthmatic bronchitis :she is followed by her primary care physician , uses inhalers  Immunosuppression. Continue Humira and prednisone following Humira injections. Consider switching to Cimzia or Simponi Bone density today, check or Pneumovax ,she said she had it last year with her PCP, check hepatitis A antibody and hepatitis B serologies OV 2  months  02/09/2013 Kaitlyn Jennings

## 2013-02-10 LAB — HEPATITIS A ANTIBODY, IGM: Hep A IgM: NEGATIVE

## 2013-02-10 LAB — HEPATITIS B SURFACE ANTIBODY,QUALITATIVE: Hep B S Ab: NONREACTIVE

## 2013-02-11 ENCOUNTER — Other Ambulatory Visit: Payer: Self-pay | Admitting: *Deleted

## 2013-02-11 ENCOUNTER — Telehealth: Payer: Self-pay | Admitting: *Deleted

## 2013-02-11 DIAGNOSIS — K5190 Ulcerative colitis, unspecified, without complications: Secondary | ICD-10-CM

## 2013-02-11 NOTE — Telephone Encounter (Signed)
Patient given results of hep b labs and recommendation. Scheduled first injection on 02/18/13.

## 2013-04-12 ENCOUNTER — Ambulatory Visit: Payer: Medicare Other | Admitting: Internal Medicine

## 2013-04-26 ENCOUNTER — Other Ambulatory Visit: Payer: Self-pay | Admitting: Internal Medicine

## 2013-05-03 ENCOUNTER — Ambulatory Visit: Payer: Medicare Other | Admitting: Internal Medicine

## 2013-05-04 ENCOUNTER — Other Ambulatory Visit: Payer: Self-pay | Admitting: Internal Medicine

## 2013-05-25 ENCOUNTER — Telehealth: Payer: Self-pay | Admitting: Internal Medicine

## 2013-05-25 NOTE — Telephone Encounter (Signed)
Message copied by Oliva Bustard on Wed May 25, 2013  2:50 PM ------      Message from: Larina Bras      Created: Tue May 03, 2013 11:07 AM       Do not charge patient late cancellation/no show fee. ------

## 2013-05-27 ENCOUNTER — Ambulatory Visit: Payer: Medicare Other | Admitting: Internal Medicine

## 2013-06-23 ENCOUNTER — Telehealth: Payer: Self-pay | Admitting: Internal Medicine

## 2013-06-23 NOTE — Telephone Encounter (Signed)
Dr Juanda Chance, patient was previously on Celexa. She states that her father just passed away and she would like to restart.... Please advise.Marland KitchenMarland KitchenMarland Kitchen

## 2013-06-23 NOTE — Telephone Encounter (Signed)
OK to restart Celexa 20 mg, #60, 1 po qd x 1 week then 2 po qd, 2 refills

## 2013-06-24 NOTE — Telephone Encounter (Signed)
I still think it is OK to take Celexa 20 mg daily , don't go up to 66m, please obtain a 12 lead EKG 1 week after starting Celexa.

## 2013-06-24 NOTE — Telephone Encounter (Signed)
I will ask her to have an EKG done by her PCP. The last one done on a patient at our office was on an emergency basis and was conducted by research. They dont typically do EKGs on our office patients. Would that be okay?

## 2013-06-24 NOTE — Telephone Encounter (Signed)
Well, she would not know if she had a prolonged QT interval because it is asymptomatic most of the time, but can you do an EKG on her before she sees me next time? thanx

## 2013-06-24 NOTE — Telephone Encounter (Signed)
It is OK  To have the EKG done by her PCP as long as I have it availavle for review when I see her. Thanx

## 2013-06-24 NOTE — Telephone Encounter (Signed)
Patient states that she originally stopped taking her celexa 3 weeks ago but has now started taking her mother's celexa prescription because her mother no longer needed the medication. She states that she has been back on it for a while now but wanted to let us know because her prescription ran out. Patient has an appointment with Dr Juanda Chance on 07/01/13. Dr Juanda Chance, does patient still need EKG since she has now apparently been on the medication again already?

## 2013-06-24 NOTE — Telephone Encounter (Signed)
DB- Im now getting this warning when I try to send Celexa for patient. I just want to make sure she is still okay to take this.Marland KitchenMarland Kitchen"Plasma concentrations and toxic effects of Citalopram may be increased by concomitant administration of Omeprazole. Specifically, citalopram doses greater than 20 mg/day are not recommended in patients receiving Omeprazole according to official package labeling due to the risk of QT prolongation."

## 2013-06-27 NOTE — Telephone Encounter (Signed)
Patient states that she has multiple things going on with her father's estate that she is taking care of. She is unable to get EKG this week but states that she will have it next week and have it sent to Dr Juanda Chance.

## 2013-07-01 ENCOUNTER — Ambulatory Visit: Payer: Medicare Other | Admitting: Internal Medicine

## 2013-08-02 ENCOUNTER — Ambulatory Visit: Payer: Medicare Other | Admitting: Internal Medicine

## 2013-08-30 ENCOUNTER — Encounter: Payer: Self-pay | Admitting: Internal Medicine

## 2013-08-30 ENCOUNTER — Ambulatory Visit (INDEPENDENT_AMBULATORY_CARE_PROVIDER_SITE_OTHER): Payer: Medicare Other | Admitting: Internal Medicine

## 2013-08-30 ENCOUNTER — Other Ambulatory Visit (INDEPENDENT_AMBULATORY_CARE_PROVIDER_SITE_OTHER): Payer: Medicare Other

## 2013-08-30 VITALS — BP 132/90 | HR 92 | Ht 67.5 in | Wt 228.0 lb

## 2013-08-30 DIAGNOSIS — Z79899 Other long term (current) drug therapy: Secondary | ICD-10-CM

## 2013-08-30 DIAGNOSIS — K5150 Left sided colitis without complications: Secondary | ICD-10-CM

## 2013-08-30 DIAGNOSIS — D849 Immunodeficiency, unspecified: Secondary | ICD-10-CM

## 2013-08-30 LAB — COMPREHENSIVE METABOLIC PANEL
AST: 15 U/L (ref 0–37)
Albumin: 3.8 g/dL (ref 3.5–5.2)
Alkaline Phosphatase: 64 U/L (ref 39–117)
BUN: 8 mg/dL (ref 6–23)
CO2: 25 mEq/L (ref 19–32)
Calcium: 9.2 mg/dL (ref 8.4–10.5)
Chloride: 107 mEq/L (ref 96–112)
Creatinine, Ser: 0.7 mg/dL (ref 0.4–1.2)
Potassium: 3.6 mEq/L (ref 3.5–5.1)
Sodium: 139 mEq/L (ref 135–145)
Total Protein: 7.5 g/dL (ref 6.0–8.3)

## 2013-08-30 LAB — CBC WITH DIFFERENTIAL/PLATELET
Basophils Absolute: 0 10*3/uL (ref 0.0–0.1)
Eosinophils Absolute: 0.1 10*3/uL (ref 0.0–0.7)
HCT: 36.8 % (ref 36.0–46.0)
Hemoglobin: 12.2 g/dL (ref 12.0–15.0)
Lymphocytes Relative: 24.1 % (ref 12.0–46.0)
Lymphs Abs: 1.9 10*3/uL (ref 0.7–4.0)
MCHC: 33.1 g/dL (ref 30.0–36.0)
Monocytes Absolute: 0.5 10*3/uL (ref 0.1–1.0)
Monocytes Relative: 5.9 % (ref 3.0–12.0)
Neutro Abs: 5.4 10*3/uL (ref 1.4–7.7)
Platelets: 358 10*3/uL (ref 150.0–400.0)
RDW: 14.7 % — ABNORMAL HIGH (ref 11.5–14.6)

## 2013-08-30 LAB — SEDIMENTATION RATE: Sed Rate: 41 mm/hr — ABNORMAL HIGH (ref 0–22)

## 2013-08-30 MED ORDER — PREDNISONE 10 MG PO TABS: 10.0000 mg | ORAL_TABLET | Freq: Every day | ORAL | Status: DC

## 2013-08-30 MED ORDER — SERTRALINE HCL 100 MG PO TABS: 100.0000 mg | ORAL_TABLET | Freq: Every day | ORAL | Status: DC

## 2013-08-30 MED ORDER — SERTRALINE HCL 50 MG PO TABS: ORAL_TABLET | ORAL | Status: DC

## 2013-08-30 MED ORDER — PREDNISONE 5 MG PO TABS: ORAL_TABLET | ORAL | Status: DC

## 2013-08-30 MED ORDER — FOLIC ACID 1 MG PO TABS: 1.0000 mg | ORAL_TABLET | Freq: Every day | ORAL | Status: DC

## 2013-08-30 MED ORDER — HYDROCODONE-ACETAMINOPHEN 5-325 MG PO TABS: ORAL_TABLET | ORAL | Status: DC

## 2013-08-30 MED ORDER — SUCRALFATE 1 G PO TABS: 1.0000 g | ORAL_TABLET | Freq: Two times a day (BID) | ORAL | Status: DC

## 2013-08-30 MED ORDER — SULFASALAZINE 500 MG PO TABS: ORAL_TABLET | ORAL | Status: DC

## 2013-08-30 NOTE — Progress Notes (Signed)
Kaitlyn Jennings 04-05-1961 [id number]   History of Present Illness:  This is a 52 year old white female with left-sided ulcerative colitis since 1988. Her last office visit was in June 2014. She has chronic abdominal pain and hx of Imuran-induced pancreatitis. She also developed Remicade induced lupus syndrome. She is currently on Humira 40 mg every 4 weeks and she takes prednisone 15 mg a day the day before, day of and the day after taking Humira. She is doing very well. She denies diarrhea, abdominal pain or rectal bleeding. Her father passed away 6 weeks ago and she is asking for an antidepressant. She will be due for a colonoscopy in April 2015. She will also be due for a TB skin test next month. She needs refills on some of her medications.    Past Medical History  Diagnosis Date  . IBS (irritable bowel syndrome)   . Arthritis   . Anxiety   . Pancreatitis   . Ulcerative colitis   . B12 deficiency   . Chronic gastritis     Past Surgical History  Procedure Laterality Date  . Breast cyst aspiration      left  . Sinus surgery with instatrak    . Esophagus surgery      Allergies  Allergen Reactions  . Molds & Smuts   . Remicade [Infliximab]     REACTION: unspecified    Family history and social history have been reviewed.  Review of Systems: Back pain. Denies abdominal pain diarrhea constipation  The remainder of the 10 point ROS is negative except as outlined in the H&P  Physical Exam: General Appearance Well developed, in no distress, mildly overweight. Eyes  Non icteric  HEENT  Non traumatic, normocephalic  Mouth No lesion, tongue papillated, no cheilosis Neck Supple without adenopathy, thyroid not enlarged, no carotid bruits, no JVD Lungs Clear to auscultation bilaterally, audible wheezes. COR Normal S1, normal S2, regular rhythm, no murmur, quiet precordium Abdomen soft minimally tender in the epigastrium. Normal active bowel sounds Rectal soft Hemoccult negative  stool Extremities  No pedal edema Skin No lesions Neurological Alert and oriented x 3 Psychological Normal mood and affect  Assessment and Plan:   Problem #1 left-sided ulcerative colitis in remission. She will continue all medications including Humira every 4 weeks. We will recheck her CBC, metabolic panel and sedimentation rate today. She will be due for a colonoscopy in April 2015 and for a TB skin test next month. I will see her again in 6 months.  Problem #2 Depression. We will start patient on Zoloft 50 mg daily for 2 weeks then increase to 100 mg daily. She needs refills on  Carafate but will get tablets this time rather than the slurry. She is to continue Nexium. Problem #3 prolonged exposure to steroids. Her bone density was normal   [name] 08/30/2013

## 2013-08-30 NOTE — Patient Instructions (Signed)
Your physician has requested that you go to the basement for the following lab work before leaving today: CBC, Sed Rate, CMET  You will be due for a recall colonoscopy in 12/2013. We will send you a reminder in the mail when it gets closer to that time.  We have sent the following medications to your pharmacy for you to pick up at your convenience: Zoloft (50 mg x 2 weeks, then increase to 100 mg) Azulfadine Folic Acid Carafate tablets Prednsione  We have given you a prescription for Vicodin to take to your pharmacy.  CC: Dr Synetta Fail

## 2013-09-05 ENCOUNTER — Telehealth: Payer: Self-pay | Admitting: Internal Medicine

## 2013-09-05 MED ORDER — ESOMEPRAZOLE MAGNESIUM 40 MG PO CPDR: 40.0000 mg | DELAYED_RELEASE_CAPSULE | Freq: Every day | ORAL | Status: DC

## 2013-09-05 NOTE — Telephone Encounter (Signed)
Patient states she would like Nexium sent to [facility]. Prescription sent for 90 day supply.

## 2013-10-25 ENCOUNTER — Encounter: Payer: Self-pay | Admitting: Internal Medicine

## 2013-10-31 ENCOUNTER — Ambulatory Visit (INDEPENDENT_AMBULATORY_CARE_PROVIDER_SITE_OTHER): Payer: Medicare Other | Admitting: Internal Medicine

## 2013-10-31 ENCOUNTER — Telehealth: Payer: Self-pay | Admitting: *Deleted

## 2013-10-31 DIAGNOSIS — K5190 Ulcerative colitis, unspecified, without complications: Secondary | ICD-10-CM

## 2013-10-31 NOTE — Telephone Encounter (Signed)
Patient is asking for Vicodin refill. Please, advise.

## 2013-11-02 LAB — TB SKIN TEST
Induration: 0 mm
TB Skin Test: NEGATIVE

## 2013-11-02 MED ORDER — HYDROCODONE-ACETAMINOPHEN 5-325 MG PO TABS: ORAL_TABLET | ORAL | Status: DC

## 2013-11-02 NOTE — Telephone Encounter (Signed)
If she is due for it she can have it. Please look in her meds to look for last refill.

## 2013-11-02 NOTE — Telephone Encounter (Signed)
Patient is due for medication refill. Rx printed and signed. Left a message for patient that rx is ready. Rx up front for pick up.

## 2013-11-07 ENCOUNTER — Telehealth: Payer: Self-pay | Admitting: Internal Medicine

## 2013-11-07 NOTE — Telephone Encounter (Signed)
Patient states pharmacy tells her that she has no refills left on Zoloft 50 mg even though her bottle says she has 2 refills. I advised patient that I sent 1 month supply of Zoloft 50 mg and then sent an additional script for Zoloft 100 mg after that since she was to increase to 100 mg after 2 weeks. Pharmacy does have this script and will fill for patient. Patient verbalizes understanding.

## 2013-11-28 ENCOUNTER — Encounter: Payer: Self-pay | Admitting: Internal Medicine

## 2014-01-18 ENCOUNTER — Encounter: Payer: Medicare Other | Admitting: Internal Medicine

## 2014-02-20 ENCOUNTER — Telehealth: Payer: Self-pay | Admitting: Internal Medicine

## 2014-02-20 MED ORDER — HYDROCODONE-ACETAMINOPHEN 5-325 MG PO TABS: ORAL_TABLET | ORAL | Status: DC

## 2014-02-20 NOTE — Telephone Encounter (Signed)
Rx created and awaiting Dr Brodie's signature. 

## 2014-02-22 ENCOUNTER — Ambulatory Visit (AMBULATORY_SURGERY_CENTER): Payer: Self-pay | Admitting: *Deleted

## 2014-02-22 VITALS — Ht 68.5 in | Wt 224.4 lb

## 2014-02-22 DIAGNOSIS — K5190 Ulcerative colitis, unspecified, without complications: Secondary | ICD-10-CM

## 2014-02-22 MED ORDER — MOVIPREP 100 G PO SOLR: ORAL | Status: DC

## 2014-02-22 NOTE — Progress Notes (Signed)
Patient denies any allergies to eggs or soy. Patient denies any problems with sedation, patient gets "sick" with surgeries. Patient denies any oxygen use at home and does not take any diet/weight loss medications. EMMI education assisgned to patient on colonoscopy, this was explained and instructions given to patient.

## 2014-02-27 ENCOUNTER — Telehealth: Payer: Self-pay | Admitting: Internal Medicine

## 2014-02-27 MED ORDER — ADALIMUMAB 40 MG/0.8ML ~~LOC~~ AJKT: 1.0000 "pen " | AUTO-INJECTOR | SUBCUTANEOUS | Status: DC

## 2014-02-27 NOTE — Telephone Encounter (Signed)
Patient states that Kaitlyn Jennings has attempted to get a refill authorization from [place] for Humira. I advised that we have not gotten any correspondence from [place]. I will go ahead and send rx to [facility] today at fax [phone]. I have also given patient our fax number for future use to see if Kaitlyn Jennings is sending request to correct number.

## 2014-03-08 ENCOUNTER — Encounter: Payer: Medicare Other | Admitting: Internal Medicine

## 2014-03-22 ENCOUNTER — Telehealth: Payer: Self-pay | Admitting: Internal Medicine

## 2014-03-22 MED ORDER — HYDROCORTISONE ACE-PRAMOXINE 2.5-1 % RE CREA: 1.0000 "application " | TOPICAL_CREAM | Freq: Three times a day (TID) | RECTAL | Status: DC | PRN

## 2014-03-22 NOTE — Telephone Encounter (Signed)
Patient states that she has anal fissure again. States she is having rectal pain and bleeding after bowel movements. No other symptoms. She got Analpram several years ago and wonders if she can get refills. Dr Olevia Perches, are you okay with me filling rx or does she need office visit to evaluate first?

## 2014-03-22 NOTE — Telephone Encounter (Signed)
I have sent the note mistakenly to [facility]

## 2014-03-22 NOTE — Telephone Encounter (Signed)
Refill Analpram2.5% .30 gm apply tid prn anal fissure, 1 refill

## 2014-03-22 NOTE — Telephone Encounter (Signed)
I have sent rx and advised patient.

## 2014-05-10 ENCOUNTER — Telehealth: Payer: Self-pay | Admitting: Internal Medicine

## 2014-05-10 DIAGNOSIS — K51918 Ulcerative colitis, unspecified with other complication: Secondary | ICD-10-CM

## 2014-05-10 MED ORDER — MOVIPREP 100 G PO SOLR: ORAL | Status: DC

## 2014-05-10 NOTE — Telephone Encounter (Signed)
Resent Moviprep to patient's pharmacy, [facility]. Patient was notified.  No further questions or concerns from patient.

## 2014-05-16 ENCOUNTER — Telehealth: Payer: Self-pay | Admitting: Internal Medicine

## 2014-05-16 NOTE — Telephone Encounter (Signed)
I spoke with the pt, she has an abscessed tooth, seeing her dentist today.No late charge.

## 2014-05-17 ENCOUNTER — Encounter: Payer: Medicare Other | Admitting: Internal Medicine

## 2014-06-05 ENCOUNTER — Telehealth: Payer: Self-pay | Admitting: Internal Medicine

## 2014-06-05 DIAGNOSIS — R197 Diarrhea, unspecified: Secondary | ICD-10-CM

## 2014-06-05 NOTE — Telephone Encounter (Signed)
Please obtain stool for pathogens and begin Flagyl 250 mg po tid, low residue diet.

## 2014-06-05 NOTE — Telephone Encounter (Signed)
Patient states that she was on Amoxicillin 500 mg three times daily x 7 days recently for abscessed tooth. Since Saturday, she has had abdominal cramping, bleeding, and diarrhea with urgency. She states that she feels like she is "completely out of remission." She does not note any malodorous stool. She is currently on sulfasalazine 500 mg 2 tablets twice daily, Humira 40 mg every 14 days and prednisone 15 mg daily. Dr Olevia Perches, please advise.

## 2014-06-06 MED ORDER — DICYCLOMINE HCL 20 MG PO TABS: 20.0000 mg | ORAL_TABLET | Freq: Four times a day (QID) | ORAL | Status: DC | PRN

## 2014-06-06 MED ORDER — METRONIDAZOLE 250 MG PO TABS: 250.0000 mg | ORAL_TABLET | Freq: Three times a day (TID) | ORAL | Status: DC

## 2014-06-06 MED ORDER — HYDROCODONE-ACETAMINOPHEN 5-325 MG PO TABS: ORAL_TABLET | ORAL | Status: DC

## 2014-06-06 NOTE — Addendum Note (Signed)
Addended by: Larina Bras on: 06/06/2014 02:51 PM   Modules accepted: Orders

## 2014-06-06 NOTE — Telephone Encounter (Signed)
I have confirmed with Dr Olevia Perches that she would like patient to have 10 days of Flagyl. I have also spoken to patient and have given Dr Nichola Sizer recommendations. She verbalizes understanding.

## 2014-06-06 NOTE — Telephone Encounter (Signed)
Patient states that she also needs a refill of hydrocodone. She is due for this. I have created new rx which is awaiting Dr Nichola Sizer signature.

## 2014-06-07 ENCOUNTER — Other Ambulatory Visit: Payer: Medicare Other

## 2014-06-07 DIAGNOSIS — R197 Diarrhea, unspecified: Secondary | ICD-10-CM

## 2014-06-09 LAB — GASTROINTESTINAL PATHOGEN PANEL PCR
C. difficile Tox A/B, PCR: NEGATIVE
Campylobacter, PCR: NEGATIVE
Cryptosporidium, PCR: NEGATIVE
E COLI (STEC) STX1/STX2, PCR: NEGATIVE
E coli (ETEC) LT/ST PCR: NEGATIVE
E coli 0157, PCR: NEGATIVE
Giardia lamblia, PCR: NEGATIVE
Norovirus, PCR: NEGATIVE
ROTAVIRUS, PCR: NEGATIVE
SALMONELLA, PCR: NEGATIVE
SHIGELLA, PCR: NEGATIVE

## 2014-07-07 ENCOUNTER — Encounter: Payer: Self-pay | Admitting: Internal Medicine

## 2014-07-07 ENCOUNTER — Ambulatory Visit (AMBULATORY_SURGERY_CENTER): Payer: Medicare Other | Admitting: Internal Medicine

## 2014-07-07 VITALS — BP 149/80 | HR 73 | Temp 99.3°F | Resp 17 | Ht 68.5 in | Wt 224.0 lb

## 2014-07-07 DIAGNOSIS — K51314 Ulcerative (chronic) rectosigmoiditis with abscess: Secondary | ICD-10-CM

## 2014-07-07 DIAGNOSIS — K5120 Ulcerative (chronic) proctitis without complications: Secondary | ICD-10-CM

## 2014-07-07 DIAGNOSIS — K51919 Ulcerative colitis, unspecified with unspecified complications: Secondary | ICD-10-CM

## 2014-07-07 MED ORDER — SODIUM CHLORIDE 0.9 % IV SOLN: 500.0000 mL | INTRAVENOUS | Status: DC

## 2014-07-07 NOTE — Progress Notes (Signed)
Report to PACU, RN, vss, BBS= Clear.  

## 2014-07-07 NOTE — Progress Notes (Signed)
Called to room to assist during endoscopic procedure.  Patient ID and intended procedure confirmed with present staff. Received instructions for my participation in the procedure from the performing physician.  

## 2014-07-07 NOTE — Op Note (Signed)
[place]  [address] [place], [id number]   COLONOSCOPY PROCEDURE REPORT  PATIENT: Kaitlyn Jennings, Kaitlyn Jennings  MR#: #[id number] BIRTHDATE: 12/10/1960 , 67  yrs. old GENDER: female ENDOSCOPIST: Lafayette Dragon, MD REFERRED BP:ZWCHEN Valora Piccolo, M.D. PROCEDURE DATE:  07/07/2014 PROCEDURE:   Colonoscopy with biopsy First Screening Colonoscopy - Avg.  risk and is 50 yrs.  old or older - No.  Prior Negative Screening - Now for repeat screening. N/A  History of Adenoma - Now for follow-up colonoscopy & has been > or = to 3 yrs.  N/A  Polyps Removed Today? No. ASA CLASS:   Class II INDICATIONS:ulcerative colitis since 1988.  Predominantly left sided.  Prior colonoscopies in 2006 and in April 2010.  Last colonoscopy showed active colitis from 0-20 cm.  Patient has been on biologicals and mesalamine. MEDICATIONS: Monitored anesthesia care and Propofol 330 mg  IV  DESCRIPTION OF PROCEDURE:   After the risks benefits and alternatives of the procedure were thoroughly explained, informed consent was obtained.  The digital rectal exam revealed no abnormalities of the rectum.   The LB [id number]  endoscope was introduced through the anus and advanced to the cecum, which was identified by both the appendix and ileocecal valve. No adverse events experienced.   The quality of the prep was good, using MoviPrep  The instrument was then slowly withdrawn as the colon was fully examined.    [Rectum: the rectum was acutely inflamed, granular with friability and contact bleeding. Consistent with ulcerative proctitis. It was slightly contracted so I was unable to retroflex. The severe inflammatory tape changes extended from 0-25 cm from the anal canal and milder changes were encountered toe at 30 cm. Colon:  Mucosa above 30 cm appeared entirely normal with only occasional abrasions of the mucosa. Transverse colon and ascending colon and cecum were normal. Biopsies were obtained from the  cecal pouch and ileocecal valve      The time to cecum=9 minutes 45 seconds.  Withdrawal time=10 minutes 24 seconds.  The scope was withdrawn and the procedure completed. COMPLICATIONS: There were no immediate complications.  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 Biopsies taken 2. Mild colitis from 25-35 cm status post biopsies 3. Normal appearing descending transverse and ascending colon including cecum and ileocecal valve. Status post random biopsies  RECOMMENDATIONS: 1.  Await pathology results 2.  Continue current medications 3.recall colonoscopy in 3 years to rule out dysplasia  eSigned:  Lafayette Dragon, MD 07/07/2014 11:14 AM   cc:   PATIENT NAME:  [name], [name] MR#: #[id number]

## 2014-07-07 NOTE — Patient Instructions (Signed)
YOU HAD AN ENDOSCOPIC PROCEDURE TODAY AT THE [facility]: Refer to the procedure report that was given to you for any specific questions about what was found during the examination.  If the procedure report does not answer your questions, please call your gastroenterologist to clarify.  If you requested that your care partner not be given the details of your procedure findings, then the procedure report has been included in a sealed envelope for you to review at your convenience later.  YOU SHOULD EXPECT: Some feelings of bloating in the abdomen. Passage of more gas than usual.  Walking can help get rid of the air that was put into your GI tract during the procedure and reduce the bloating. If you had a lower endoscopy (such as a colonoscopy or flexible sigmoidoscopy) you may notice spotting of blood in your stool or on the toilet paper. If you underwent a bowel prep for your procedure, then you may not have a normal bowel movement for a few days.  DIET: Your first meal following the procedure should be a light meal and then it is ok to progress to your normal diet.  A half-sandwich or bowl of soup is an example of a good first meal.  Heavy or fried foods are harder to digest and may make you feel nauseous or bloated.  Likewise meals heavy in dairy and vegetables can cause extra gas to form and this can also increase the bloating.  Drink plenty of fluids but you should avoid alcoholic beverages for 24 hours.  ACTIVITY: Your care partner should take you home directly after the procedure.  You should plan to take it easy, moving slowly for the rest of the day.  You can resume normal activity the day after the procedure however you should NOT DRIVE or use heavy machinery for 24 hours (because of the sedation medicines used during the test).    SYMPTOMS TO REPORT IMMEDIATELY: A gastroenterologist can be reached at any hour.  During normal business hours, 8:30 AM to 5:00 PM Monday through Friday,  call [phone].  After hours and on weekends, please call the GI answering service at [phone] who will take a message and have the physician on call contact you.   Following lower endoscopy (colonoscopy or flexible sigmoidoscopy):  Excessive amounts of blood in the stool  Significant tenderness or worsening of abdominal pains  Swelling of the abdomen that is new, acute  Fever of 100F or higher  FOLLOW UP: If any biopsies were taken you will be contacted by phone or by letter within the next 1-3 weeks.  Call your gastroenterologist if you have not heard about the biopsies in 3 weeks.  Our staff will call the home number listed on your records the next business day following your procedure to check on you and address any questions or concerns that you may have at that time regarding the information given to you following your procedure. This is a courtesy call and so if there is no answer at the home number and we have not heard from you through the emergency physician on call, we will assume that you have returned to your regular daily activities without incident.  SIGNATURES/CONFIDENTIALITY: You and/or your care partner have signed paperwork which will be entered into your electronic medical record.  These signatures attest to the fact that that the information above on your After Visit Summary has been reviewed and is understood.  Full responsibility of the confidentiality of this   discharge information lies with you and/or your care-partner.  Please, go to every two weeks with the humira per Dr. Olevia Perches.

## 2014-07-10 ENCOUNTER — Telehealth: Payer: Self-pay

## 2014-07-10 NOTE — Telephone Encounter (Signed)
  Follow up Call-  Call back number 07/07/2014  Post procedure Call Back phone  # [phone]  Permission to leave phone message Yes     Patient questions:  Do you have a fever, pain , or abdominal swelling? No. Pain Score  0 *  Have you tolerated food without any problems? Yes.    Have you been able to return to your normal activities? Yes.    Do you have any questions about your discharge instructions: Diet   No. Medications  No. Follow up visit  No.  Do you have questions or concerns about your Care? No.  Actions: * If pain score is 4 or above: No action needed, pain <4.

## 2014-07-12 ENCOUNTER — Other Ambulatory Visit: Payer: Self-pay | Admitting: *Deleted

## 2014-07-12 ENCOUNTER — Encounter: Payer: Self-pay | Admitting: Internal Medicine

## 2014-07-12 MED ORDER — ESOMEPRAZOLE MAGNESIUM 40 MG PO CPDR: 40.0000 mg | DELAYED_RELEASE_CAPSULE | Freq: Every day | ORAL | Status: DC

## 2014-07-24 ENCOUNTER — Telehealth: Payer: Self-pay | Admitting: Internal Medicine

## 2014-07-24 NOTE — Telephone Encounter (Signed)
Spoke with patient and she states she is having urgency to go to the bathroom. She is having bright, red blood with some stool. She is on Humira and Sulfasalazine 4 tablets/daily. She is also cramping. Dicyclomine is helping this. She is asking for Prednisone rx. Offered OV with extender today but she declines. Please, advise.

## 2014-07-26 MED ORDER — HYDROCODONE-ACETAMINOPHEN 5-325 MG PO TABS: ORAL_TABLET | ORAL | Status: DC

## 2014-07-26 MED ORDER — HYDROCORTISONE ACETATE 25 MG RE SUPP: RECTAL | Status: DC

## 2014-07-26 MED ORDER — PREDNISONE 10 MG PO TABS: ORAL_TABLET | ORAL | Status: DC

## 2014-07-26 NOTE — Telephone Encounter (Signed)
Please start Prednisone 20 mg po qd, x 2 weeks, 15 mg po qd x 2 weeks, 10 mg po qd x 2 weeks then 5 mg po qd x 2 weeks. Prednisone 10 mg, # 100, 1 refill  Hydrocorticone supp 25 mg,  #30, 1 refill1 hs. I just did a colonoscopy on her, she has active disease to 25 cm.

## 2014-07-26 NOTE — Telephone Encounter (Signed)
She is also asking for refill on pain medication. Please, advise.

## 2014-07-26 NOTE — Telephone Encounter (Signed)
Rx's sent to pharmacy. Vicodin rx up front for pick up.

## 2014-07-26 NOTE — Telephone Encounter (Signed)
Expand All Collapse All   Spoke with patient and she states she is having urgency to go to the bathroom. She is having bright, red blood with some stool. She is on Humira and Sulfasalazine 4 tablets/daily. She is also cramping. Dicyclomine is helping this. She is asking for Prednisone rx. Offered OV with extender today but she declines. Please, advise.         Patient is also asking for refill on pain medications. Please, advise

## 2014-07-26 NOTE — Telephone Encounter (Signed)
OK to refill pain meds

## 2014-07-26 NOTE — Telephone Encounter (Signed)
Pt calling back from previous message and wants to know what the next step is going to be and she needs a refill on her pain meds

## 2014-08-09 ENCOUNTER — Other Ambulatory Visit: Payer: Self-pay | Admitting: Internal Medicine

## 2014-08-29 ENCOUNTER — Telehealth: Payer: Self-pay | Admitting: Internal Medicine

## 2014-08-29 NOTE — Telephone Encounter (Signed)
Disability forms have been filled out by Dr Olevia Perches and have been sent down to [facility] for further processing this morning. Patient advised.

## 2014-09-20 ENCOUNTER — Other Ambulatory Visit: Payer: Self-pay | Admitting: Internal Medicine

## 2014-09-22 ENCOUNTER — Other Ambulatory Visit: Payer: Self-pay | Admitting: Internal Medicine

## 2014-09-27 ENCOUNTER — Telehealth: Payer: Self-pay | Admitting: *Deleted

## 2014-09-27 DIAGNOSIS — K51219 Ulcerative (chronic) proctitis with unspecified complications: Secondary | ICD-10-CM

## 2014-09-27 NOTE — Telephone Encounter (Signed)
Spoke with patient and told her she is due for TB testing in February. Lab in [facility]. She will come for lab.

## 2014-10-06 ENCOUNTER — Telehealth: Payer: Self-pay | Admitting: Internal Medicine

## 2014-10-09 MED ORDER — HYDROCODONE-ACETAMINOPHEN 5-325 MG PO TABS: ORAL_TABLET | ORAL | Status: DC

## 2014-10-10 NOTE — Telephone Encounter (Signed)
Rx was printed and signed by Dr. Olevia Perches. Called patient to let her know to pick up her Vicodin prescription. Patient stated she would pick up on Wednesday, October 11, 2014.

## 2014-10-25 ENCOUNTER — Telehealth: Payer: Self-pay | Admitting: *Deleted

## 2014-10-25 NOTE — Telephone Encounter (Signed)
Left a message for patient to call back. (calling to remind she is due for TB testing this month)

## 2014-10-25 NOTE — Telephone Encounter (Signed)
Spoke with patient and she states she will come for lab for TB testing.

## 2014-10-30 ENCOUNTER — Other Ambulatory Visit: Payer: Self-pay | Admitting: Internal Medicine

## 2014-11-01 ENCOUNTER — Encounter: Payer: Self-pay | Admitting: *Deleted

## 2014-11-01 ENCOUNTER — Telehealth: Payer: Self-pay | Admitting: *Deleted

## 2014-11-01 NOTE — Telephone Encounter (Signed)
Mailed a letter to patient requesting she come for labs.(she has been called twice)

## 2014-11-01 NOTE — Telephone Encounter (Signed)
-----   Message from Hulan Saas, RN sent at 09/27/2014  2:03 PM EST ----- Did patient get TB lab done for DB

## 2014-11-06 ENCOUNTER — Telehealth: Payer: Self-pay | Admitting: Internal Medicine

## 2014-11-06 NOTE — Telephone Encounter (Signed)
Noted  

## 2014-11-15 ENCOUNTER — Other Ambulatory Visit: Payer: Self-pay

## 2014-11-15 DIAGNOSIS — K51219 Ulcerative (chronic) proctitis with unspecified complications: Secondary | ICD-10-CM

## 2014-11-17 LAB — QUANTIFERON TB GOLD ASSAY (BLOOD)
Mitogen value: 0.17 IU/mL
Quantiferon Nil Value: 0.04 IU/mL
Quantiferon Tb Ag Minus Nil Value: 0.01 IU/mL
TB Ag value: 0.05 IU/mL

## 2014-11-20 ENCOUNTER — Other Ambulatory Visit: Payer: Self-pay

## 2014-11-20 DIAGNOSIS — Z79899 Other long term (current) drug therapy: Secondary | ICD-10-CM

## 2014-11-20 DIAGNOSIS — Z7960 Long term (current) use of unspecified immunomodulators and immunosuppressants: Secondary | ICD-10-CM

## 2014-12-13 ENCOUNTER — Other Ambulatory Visit: Payer: Self-pay | Admitting: Internal Medicine

## 2014-12-19 ENCOUNTER — Ambulatory Visit: Payer: Self-pay | Admitting: Internal Medicine

## 2015-01-03 ENCOUNTER — Telehealth: Payer: Self-pay | Admitting: Internal Medicine

## 2015-01-03 NOTE — Telephone Encounter (Signed)
Pt has an appt on 01-23-15 w/Dr. Olevia Perches

## 2015-01-03 NOTE — Telephone Encounter (Signed)
Pt needs to have an office visit before they can receive any more refills of Vicodin.

## 2015-01-04 MED ORDER — HYDROCODONE-ACETAMINOPHEN 5-325 MG PO TABS: ORAL_TABLET | ORAL | Status: DC

## 2015-01-04 NOTE — Telephone Encounter (Signed)
Waiting for Dr. Olevia Perches to sign Rx for hydrocodone-acetaminophen (NORCO/VICODIN), 5-325 mg, #60 with no refills. Once she signs, will call patient to let them know their Rx is ready for pickup.

## 2015-01-05 ENCOUNTER — Telehealth: Payer: Self-pay | Admitting: *Deleted

## 2015-01-05 NOTE — Telephone Encounter (Signed)
Called the patient to let them know their Rx for hydrocodone-acetaminophen (NORCO/VICODIN), 5-325 mg was ready for pickup. The patient stated they understood.

## 2015-01-23 ENCOUNTER — Ambulatory Visit (INDEPENDENT_AMBULATORY_CARE_PROVIDER_SITE_OTHER): Payer: Medicare Other | Admitting: Internal Medicine

## 2015-01-23 ENCOUNTER — Other Ambulatory Visit (INDEPENDENT_AMBULATORY_CARE_PROVIDER_SITE_OTHER): Payer: Medicare Other

## 2015-01-23 ENCOUNTER — Encounter: Payer: Self-pay | Admitting: Internal Medicine

## 2015-01-23 VITALS — BP 146/94 | HR 68 | Ht 68.5 in | Wt 221.2 lb

## 2015-01-23 DIAGNOSIS — R195 Other fecal abnormalities: Secondary | ICD-10-CM | POA: Diagnosis not present

## 2015-01-23 DIAGNOSIS — D849 Immunodeficiency, unspecified: Secondary | ICD-10-CM

## 2015-01-23 DIAGNOSIS — K5190 Ulcerative colitis, unspecified, without complications: Secondary | ICD-10-CM

## 2015-01-23 DIAGNOSIS — D899 Disorder involving the immune mechanism, unspecified: Secondary | ICD-10-CM

## 2015-01-23 LAB — COMPREHENSIVE METABOLIC PANEL
ALK PHOS: 70 U/L (ref 39–117)
ALT: 7 U/L (ref 0–35)
AST: 11 U/L (ref 0–37)
Albumin: 3.9 g/dL (ref 3.5–5.2)
BILIRUBIN TOTAL: 0.2 mg/dL (ref 0.2–1.2)
BUN: 8 mg/dL (ref 6–23)
CHLORIDE: 103 meq/L (ref 96–112)
CO2: 27 mEq/L (ref 19–32)
CREATININE: 0.66 mg/dL (ref 0.40–1.20)
Calcium: 9.4 mg/dL (ref 8.4–10.5)
GFR: 99.01 mL/min (ref >60.00)
Glucose, Bld: 120 mg/dL — ABNORMAL HIGH (ref 70–99)
POTASSIUM: 3.5 meq/L (ref 3.5–5.1)
Sodium: 138 mEq/L (ref 135–145)
Total Protein: 7.1 g/dL (ref 6.0–8.3)

## 2015-01-23 LAB — CBC WITH DIFFERENTIAL/PLATELET
BASOS PCT: 0.3 % (ref 0.0–3.0)
Basophils Absolute: 0 10*3/uL (ref 0.0–0.1)
EOS PCT: 3.1 % (ref 0.0–5.0)
Eosinophils Absolute: 0.2 10*3/uL (ref 0.0–0.7)
HEMATOCRIT: 35.1 % — AB (ref 36.0–46.0)
HEMOGLOBIN: 11.4 g/dL — AB (ref 12.0–15.0)
LYMPHS ABS: 1.6 10*3/uL (ref 0.7–4.0)
Lymphocytes Relative: 20.3 % (ref 12.0–46.0)
MCHC: 32.5 g/dL (ref 30.0–36.0)
MCV: 76.7 fl — ABNORMAL LOW (ref 78.0–100.0)
MONO ABS: 0.4 10*3/uL (ref 0.1–1.0)
Monocytes Relative: 5.7 % (ref 3.0–12.0)
NEUTROS ABS: 5.5 10*3/uL (ref 1.4–7.7)
Neutrophils Relative %: 70.6 % (ref 43.0–77.0)
Platelets: 405 10*3/uL — ABNORMAL HIGH (ref 150.0–400.0)
RBC: 4.58 Mil/uL (ref 3.87–5.11)
RDW: 17.8 % — ABNORMAL HIGH (ref 11.5–15.5)
WBC: 7.8 10*3/uL (ref 4.0–10.5)

## 2015-01-23 LAB — IBC PANEL
IRON: 51 ug/dL (ref 42–145)
Saturation Ratios: 11.6 % — ABNORMAL LOW (ref 20.0–50.0)
TRANSFERRIN: 315 mg/dL (ref 212.0–360.0)

## 2015-01-23 LAB — SEDIMENTATION RATE: Sed Rate: 37 mm/hr — ABNORMAL HIGH (ref 0–22)

## 2015-01-23 MED ORDER — HYDROCODONE-ACETAMINOPHEN 5-325 MG PO TABS: ORAL_TABLET | ORAL | Status: DC

## 2015-01-23 MED ORDER — MESALAMINE 4 G RE ENEM: ENEMA | RECTAL | Status: DC

## 2015-01-23 MED ORDER — SERTRALINE HCL 100 MG PO TABS: ORAL_TABLET | ORAL | Status: DC

## 2015-01-23 MED ORDER — SUCRALFATE 1 G PO TABS: ORAL_TABLET | ORAL | Status: DC

## 2015-01-23 MED ORDER — OMEPRAZOLE 40 MG PO CPDR: 40.0000 mg | DELAYED_RELEASE_CAPSULE | Freq: Every day | ORAL | Status: DC

## 2015-01-23 MED ORDER — HYDROCORTISONE ACETATE 25 MG RE SUPP: RECTAL | Status: DC

## 2015-01-23 NOTE — Progress Notes (Signed)
Kaitlyn Jennings December 15, 1960 [id number]  Note: This dictation was prepared with Dragon digital system. Any transcriptional errors that result from this procedure are unintentional.   History of Present Illness: This is a 54 year old white female with left-sided ulcerative colitis since 1988. Chronic abdominal pain. Imuran-induced pancreatitis. Remicade induced systemic lupus. Currently on Humira 40 mg every 2 weeks. . Last TB test March 2016  me. QuantiFERON test.  Last colonoscopy in October 2015 showed active colitis from 0-20 cm and mild colitis between 20 and 30 cm. There were no granulomas. Prior colonoscopy is 2006 and 2010. Last office visit December 2014. She is here today to refill medications. She is seeing small amount of blood per rectum and is having intermittent diarrhea. She is improved after taking hydrocortisone 25 mg suppository every night. She is also on sulfasalazine 1 g twice a day with folic acid 1 mg daily. Her main complaint is crampy abdominal pain. It's partially controlled on dicyclomine 20 mg 3 times a day. She has been under great deal of stress since her father passed away. She complains of bloating but no fever, nausea or vomiting. Nexium is no longer covered by her insurance. We will have to switch to another PPI. Her weight remains excessive. She needs a refill on hydrocodone. She  takes prednisone 15 mg prior to tHumira  injection as well as the day after the injection. Last colonoscopy October 2015 showed active left-sided colitis    Past Medical History  Diagnosis Date  . IBS (irritable bowel syndrome)   . Arthritis   . Anxiety   . Pancreatitis   . Ulcerative colitis   . B12 deficiency   . Chronic gastritis     Past Surgical History  Procedure Laterality Date  . Breast cyst aspiration      left  . Sinus surgery with instatrak    . Esophagus surgery      Allergies  Allergen Reactions  . Molds & Smuts   . Remicade [Infliximab] Other (See Comments)   REACTION: lupus, joint pain    Family history and social history have been reviewed.  Review of Systems: Crampy abdominal pain. Diarrhea. Rectal bleeding  The remainder of the 10 point ROS is negative except as outlined in the H&P  Physical Exam: General Appearance Well developed, in no distress, overweight Eyes  Non icteric  HEENT  Non traumatic, normocephalic  Mouth No lesion, tongue papillated, no cheilosis Neck Supple without adenopathy, thyroid not enlarged, no carotid bruits, no JVD Lungs Clear to auscultation bilaterally COR Normal S1, normal S2, regular rhythm, no murmur, quiet precordium Abdomen obese. Soft. Diffusely tender in all quadrants. Small so in epigastrium and left lower quadrant Rectal normal perianal area. Normal rectal sphincter tone. Stool is strongly Hemoccult-positive with a visible blood on the glove Extremities  No pedal edema Skin No lesions Neurological Alert and oriented x 3 Psychological Normal mood and affect  Assessment and Plan:   54 year old white female with left-sided ulcerative colitis of at least 30 years duration followed by me. She is still remains symptomatic but is more or lessl able to function. Her symptoms are always precipitated by stress. I believe we dealing with a combination of U. colitis and irritable bowel syndrome. She will increase the Zoloft to 150 mg a day for 2 weeks and then  go up to 200 mg daily. We will restart Rowasa enemas twice a week continue hydrocortisone suppositories 3 times a week. She will refill Azulfidinr, hydrocodone.. Today we will  recheck her CBC, sedimentation rate, B12, iron levels and metabolic panel. Office visit in 6 months. Recall colonoscopy October 2018.Cosider abd. sono next time.    Delfin Edis 01/23/2015

## 2015-01-23 NOTE — Patient Instructions (Addendum)
Your physician has requested that you go to the basement for the following lab work before leaving today:  CBC, ESR, CMET, Iron, TIBC  We have sent the following medications to your pharmacy for you to pick up at your convenience:  rowasa enemas, sulfasalazine, hydrocortisone suppositories, Omeprazole, hydrocodone, Zoloft  Dr Con Memos

## 2015-01-24 ENCOUNTER — Other Ambulatory Visit: Payer: Self-pay | Admitting: *Deleted

## 2015-01-24 DIAGNOSIS — D509 Iron deficiency anemia, unspecified: Secondary | ICD-10-CM

## 2015-01-26 ENCOUNTER — Other Ambulatory Visit (HOSPITAL_COMMUNITY): Payer: Self-pay | Admitting: Internal Medicine

## 2015-01-26 ENCOUNTER — Encounter (HOSPITAL_COMMUNITY): Payer: Self-pay

## 2015-01-26 ENCOUNTER — Encounter (HOSPITAL_COMMUNITY)
Admission: RE | Admit: 2015-01-26 | Discharge: 2015-01-26 | Disposition: A | Discharge status: Home / Self Care | Payer: Medicare Other | Source: Ambulatory Visit | Attending: Internal Medicine | Admitting: Internal Medicine

## 2015-01-26 VITALS — BP 138/69 | HR 70 | Temp 97.8°F | Resp 18 | Ht 68.5 in | Wt 221.0 lb

## 2015-01-26 DIAGNOSIS — D509 Iron deficiency anemia, unspecified: Secondary | ICD-10-CM | POA: Diagnosis not present

## 2015-01-26 MED ORDER — SODIUM CHLORIDE 0.9 % IV SOLN
Freq: Every day | INTRAVENOUS | Status: DC
  Administered 2015-01-26: 250 mL via INTRAVENOUS

## 2015-01-26 MED ORDER — SODIUM CHLORIDE 0.9 % IV SOLN
510.0000 mg | INTRAVENOUS | Status: DC
  Administered 2015-01-26: 510 mg via INTRAVENOUS
  Filled 2015-01-26: qty 17

## 2015-01-26 NOTE — Discharge Instructions (Signed)
FERAHEME °Ferumoxytol injection °What is this medicine? °FERUMOXYTOL is an iron complex. Iron is used to make healthy red blood cells, which carry oxygen and nutrients throughout the body. This medicine is used to treat iron deficiency anemia in people with chronic kidney disease. °This medicine may be used for other purposes; ask your health care provider or pharmacist if you have questions. °COMMON BRAND NAME(S): Feraheme °What should I tell my health care provider before I take this medicine? °They need to know if you have any of these conditions: °-anemia not caused by low iron levels °-high levels of iron in the blood °-magnetic resonance imaging (MRI) test scheduled °-an unusual or allergic reaction to iron, other medicines, foods, dyes, or preservatives °-pregnant or trying to get pregnant °-breast-feeding °How should I use this medicine? °This medicine is for injection into a vein. It is given by a health care professional in a hospital or clinic setting. °Talk to your pediatrician regarding the use of this medicine in children. Special care may be needed. °Overdosage: If you think you've taken too much of this medicine contact a poison control center or emergency room at once. °Overdosage: If you think you have taken too much of this medicine contact a poison control center or emergency room at once. °NOTE: This medicine is only for you. Do not share this medicine with others. °What if I miss a dose? °It is important not to miss your dose. Call your doctor or health care professional if you are unable to keep an appointment. °What may interact with this medicine? °This medicine may interact with the following medications: °-other iron products °This list may not describe all possible interactions. Give your health care provider a list of all the medicines, herbs, non-prescription drugs, or dietary supplements you use. Also tell them if you smoke, drink alcohol, or use illegal drugs. Some items may interact  with your medicine. °What should I watch for while using this medicine? °Visit your doctor or healthcare professional regularly. Tell your doctor or healthcare professional if your symptoms do not start to get better or if they get worse. You may need blood work done while you are taking this medicine. °You may need to follow a special diet. Talk to your doctor. Foods that contain iron include: whole grains/cereals, dried fruits, beans, or peas, leafy green vegetables, and organ meats (liver, kidney). °What side effects may I notice from receiving this medicine? °Side effects that you should report to your doctor or health care professional as soon as possible: °-allergic reactions like skin rash, itching or hives, swelling of the face, lips, or tongue °-breathing problems °-changes in blood pressure °-feeling faint or lightheaded, falls °-fever or chills °-flushing, sweating, or hot feelings °-swelling of the ankles or feet °Side effects that usually do not require medical attention (Report these to your doctor or health care professional if they continue or are bothersome.): °-diarrhea °-headache °-nausea, vomiting °-stomach pain °This list may not describe all possible side effects. Call your doctor for medical advice about side effects. You may report side effects to FDA at 1-800-FDA-1088. °Where should I keep my medicine? °This drug is given in a hospital or clinic and will not be stored at home. °NOTE: This sheet is a summary. It may not cover all possible information. If you have questions about this medicine, talk to your doctor, pharmacist, or health care provider. °© 2015, Elsevier/Gold Standard. (2012-04-09 15:23:36 °Anemia, Nonspecific °Anemia is a condition in which the concentration of red blood   cells or hemoglobin in the blood is below normal. Hemoglobin is a substance in red blood cells that carries oxygen to the tissues of the body. Anemia results in not enough oxygen reaching these tissues.    °CAUSES  °Common causes of anemia include:  °· Excessive bleeding. Bleeding may be internal or external. This includes excessive bleeding from periods (in women) or from the intestine.   °· Poor nutrition.   °· Chronic kidney, thyroid, and liver disease.  °· Bone marrow disorders that decrease red blood cell production. °· Cancer and treatments for cancer. °· HIV, AIDS, and their treatments. °· Spleen problems that increase red blood cell destruction. °· Blood disorders. °· Excess destruction of red blood cells due to infection, medicines, and autoimmune disorders. °SIGNS AND SYMPTOMS  °· Minor weakness.   °· Dizziness.   °· Headache. °· Palpitations.   °· Shortness of breath, especially with exercise.   °· Paleness. °· Cold sensitivity. °· Indigestion. °· Nausea. °· Difficulty sleeping. °· Difficulty concentrating. °Symptoms may occur suddenly or they may develop slowly.  °DIAGNOSIS  °Additional blood tests are often needed. These help your health care provider determine the best treatment. Your health care provider will check your stool for blood and look for other causes of blood loss.  °TREATMENT  °Treatment varies depending on the cause of the anemia. Treatment can include:  °· Supplements of iron, vitamin B12, or folic acid.   °· Hormone medicines.   °· A blood transfusion. This may be needed if blood loss is severe.   °· Hospitalization. This may be needed if there is significant continual blood loss.   °· Dietary changes. °· Spleen removal. °HOME CARE INSTRUCTIONS °Keep all follow-up appointments. It often takes many weeks to correct anemia, and having your health care provider check on your condition and your response to treatment is very important. °SEEK IMMEDIATE MEDICAL CARE IF:  °· You develop extreme weakness, shortness of breath, or chest pain.   °· You become dizzy or have trouble concentrating. °· You develop heavy vaginal bleeding.   °· You develop a rash.   °· You have bloody or black, tarry  stools.   °· You faint.   °· You vomit up blood.   °· You vomit repeatedly.   °· You have abdominal pain. °· You have a fever or persistent symptoms for more than 2-3 days.   °· You have a fever and your symptoms suddenly get worse.   °· You are dehydrated.   °MAKE SURE YOU: °· Understand these instructions. °· Will watch your condition. °· Will get help right away if you are not doing well or get worse. °Document Released: 10/02/2004 Document Revised: 04/27/2013 Document Reviewed: 02/18/2013 °ExitCare® Patient Information ©2015 [employer]. This information is not intended to replace advice given to you by your health care provider. Make sure you discuss any questions you have with your health care provider. °) ° °

## 2015-02-01 ENCOUNTER — Telehealth: Payer: Self-pay | Admitting: Internal Medicine

## 2015-02-01 NOTE — Telephone Encounter (Signed)
Spoke with patient and she is not feeling well. She will reschedule her infusion at medical day. Patient given medical day phone number.

## 2015-02-02 ENCOUNTER — Encounter (HOSPITAL_COMMUNITY)
Admission: RE | Admit: 2015-02-02 | Payer: Medicare Other | Source: Ambulatory Visit | Attending: Internal Medicine | Admitting: Internal Medicine

## 2015-02-15 ENCOUNTER — Telehealth: Payer: Self-pay | Admitting: *Deleted

## 2015-02-15 NOTE — Telephone Encounter (Signed)
Patient states she will come for xray.

## 2015-02-15 NOTE — Telephone Encounter (Signed)
-----   Message from Hulan Saas, RN sent at 01/30/2015 11:47 AM EDT ----- Did patient get CXR done

## 2015-02-21 ENCOUNTER — Ambulatory Visit (INDEPENDENT_AMBULATORY_CARE_PROVIDER_SITE_OTHER)
Admission: RE | Admit: 2015-02-21 | Discharge: 2015-02-21 | Disposition: A | Discharge status: Home / Self Care | Payer: Medicare Other | Source: Ambulatory Visit | Attending: Internal Medicine | Admitting: Internal Medicine

## 2015-02-21 ENCOUNTER — Encounter (HOSPITAL_COMMUNITY)
Admission: RE | Admit: 2015-02-21 | Discharge: 2015-02-21 | Disposition: A | Discharge status: Home / Self Care | Payer: Medicare Other | Source: Ambulatory Visit | Attending: Internal Medicine | Admitting: Internal Medicine

## 2015-02-21 ENCOUNTER — Encounter (HOSPITAL_COMMUNITY): Payer: Self-pay

## 2015-02-21 VITALS — BP 139/83 | HR 75 | Temp 98.6°F | Resp 18

## 2015-02-21 DIAGNOSIS — D509 Iron deficiency anemia, unspecified: Secondary | ICD-10-CM

## 2015-02-21 DIAGNOSIS — Z79899 Other long term (current) drug therapy: Secondary | ICD-10-CM | POA: Diagnosis not present

## 2015-02-21 DIAGNOSIS — Z7960 Long term (current) use of unspecified immunomodulators and immunosuppressants: Secondary | ICD-10-CM

## 2015-02-21 MED ORDER — SODIUM CHLORIDE 0.9 % IV SOLN
Freq: Every day | INTRAVENOUS | Status: AC
  Administered 2015-02-21: 14:00:00 via INTRAVENOUS

## 2015-02-21 MED ORDER — FERUMOXYTOL INJECTION 510 MG/17 ML
510.0000 mg | INTRAVENOUS | Status: AC
Start: 2015-02-21 — End: 2015-02-21
  Administered 2015-02-21: 510 mg via INTRAVENOUS
  Filled 2015-02-21: qty 17

## 2015-02-26 ENCOUNTER — Telehealth: Payer: Self-pay | Admitting: *Deleted

## 2015-02-26 NOTE — Telephone Encounter (Signed)
-----   Message from Lafayette Dragon, MD sent at 02/26/2015  9:57 AM EDT ----- Yes, her Orbie Pyo tb was intermediate, and she had a CXR  In June 2016- normal.DB ----- Message -----    From: Hulan Saas, RN    Sent: 02/26/2015      To: Lafayette Dragon, MD  Did patient have CXR

## 2015-04-20 ENCOUNTER — Other Ambulatory Visit: Payer: Self-pay | Admitting: Internal Medicine

## 2015-04-24 NOTE — Telephone Encounter (Signed)
OK to refill meds

## 2015-04-24 NOTE — Telephone Encounter (Signed)
Can patient have refill on pain med

## 2015-04-25 MED ORDER — HYDROCODONE-ACETAMINOPHEN 5-325 MG PO TABS
ORAL_TABLET | ORAL | Status: DC
Start: 2015-04-25 — End: 2015-11-14

## 2015-05-17 ENCOUNTER — Telehealth: Payer: Self-pay | Admitting: *Deleted

## 2015-05-17 NOTE — Telephone Encounter (Signed)
Spoke with patient and she would like to see Dr Silverio Decamp. Patient scheduled.

## 2015-07-13 ENCOUNTER — Ambulatory Visit (INDEPENDENT_AMBULATORY_CARE_PROVIDER_SITE_OTHER): Payer: Medicare Other | Admitting: Gastroenterology

## 2015-07-13 ENCOUNTER — Encounter: Payer: Self-pay | Admitting: Gastroenterology

## 2015-07-13 ENCOUNTER — Telehealth: Payer: Self-pay | Admitting: *Deleted

## 2015-07-13 ENCOUNTER — Other Ambulatory Visit (INDEPENDENT_AMBULATORY_CARE_PROVIDER_SITE_OTHER): Payer: Medicare Other

## 2015-07-13 VITALS — BP 174/90 | HR 76 | Ht 68.5 in | Wt 222.8 lb

## 2015-07-13 DIAGNOSIS — K5180 Other ulcerative colitis without complications: Secondary | ICD-10-CM

## 2015-07-13 DIAGNOSIS — K6289 Other specified diseases of anus and rectum: Secondary | ICD-10-CM

## 2015-07-13 DIAGNOSIS — E876 Hypokalemia: Secondary | ICD-10-CM

## 2015-07-13 DIAGNOSIS — E538 Deficiency of other specified B group vitamins: Secondary | ICD-10-CM | POA: Diagnosis not present

## 2015-07-13 LAB — CBC WITH DIFFERENTIAL/PLATELET
BASOS PCT: 0.3 % (ref 0.0–3.0)
Basophils Absolute: 0 10*3/uL (ref 0.0–0.1)
EOS PCT: 1.7 % (ref 0.0–5.0)
Eosinophils Absolute: 0.2 10*3/uL (ref 0.0–0.7)
HEMATOCRIT: 35.5 % — AB (ref 36.0–46.0)
HEMOGLOBIN: 11.8 g/dL — AB (ref 12.0–15.0)
Lymphocytes Relative: 26.9 % (ref 12.0–46.0)
Lymphs Abs: 3.4 10*3/uL (ref 0.7–4.0)
MCHC: 33.2 g/dL (ref 30.0–36.0)
MCV: 84.7 fl (ref 78.0–100.0)
MONO ABS: 0.8 10*3/uL (ref 0.1–1.0)
Monocytes Relative: 6 % (ref 3.0–12.0)
Neutro Abs: 8.2 10*3/uL — ABNORMAL HIGH (ref 1.4–7.7)
Neutrophils Relative %: 65.1 % (ref 43.0–77.0)
Platelets: 429 10*3/uL — ABNORMAL HIGH (ref 150.0–400.0)
RBC: 4.2 Mil/uL (ref 3.87–5.11)
RDW: 12.9 % (ref 11.5–15.5)
WBC: 12.6 10*3/uL — AB (ref 4.0–10.5)

## 2015-07-13 LAB — COMPREHENSIVE METABOLIC PANEL
ALBUMIN: 3.8 g/dL (ref 3.5–5.2)
ALT: 8 U/L (ref 0–35)
AST: 8 U/L (ref 0–37)
Alkaline Phosphatase: 56 U/L (ref 39–117)
BUN: 12 mg/dL (ref 6–23)
CALCIUM: 9.5 mg/dL (ref 8.4–10.5)
CHLORIDE: 103 meq/L (ref 96–112)
CO2: 29 mEq/L (ref 19–32)
Creatinine, Ser: 0.71 mg/dL (ref 0.40–1.20)
GFR: 90.85 mL/min (ref >60.00)
Glucose, Bld: 108 mg/dL — ABNORMAL HIGH (ref 70–99)
POTASSIUM: 2.8 meq/L — AB (ref 3.5–5.1)
Sodium: 141 mEq/L (ref 135–145)
Total Bilirubin: 0.3 mg/dL (ref 0.2–1.2)
Total Protein: 6.8 g/dL (ref 6.0–8.3)

## 2015-07-13 LAB — VITAMIN B12: VITAMIN B 12: 163 pg/mL — AB (ref 211–911)

## 2015-07-13 LAB — FOLATE

## 2015-07-13 LAB — HIGH SENSITIVITY CRP: CRP HIGH SENSITIVITY: 5.22 mg/L — AB (ref 0.000–5.000)

## 2015-07-13 LAB — FERRITIN: Ferritin: 98.6 ng/mL (ref 10.0–291.0)

## 2015-07-13 LAB — SEDIMENTATION RATE: SED RATE: 20 mm/h (ref 0–22)

## 2015-07-13 MED ORDER — POTASSIUM CHLORIDE CRYS ER 20 MEQ PO TBCR: EXTENDED_RELEASE_TABLET | ORAL | Status: DC

## 2015-07-13 MED ORDER — HYDROCORTISONE ACETATE 25 MG RE SUPP: RECTAL | Status: DC

## 2015-07-13 MED ORDER — MESALAMINE 4 G RE ENEM
ENEMA | RECTAL | Status: DC
Start: 2015-07-13 — End: 2015-10-01

## 2015-07-13 NOTE — Telephone Encounter (Signed)
Called patient to inform Potassium was low, sent in script for her of potassium to start today  Per Dr Silverio Decamp take 48meq 4 days then 52meq everyday  Recheck BMET in 1 week  Orders are in for next Friday

## 2015-07-13 NOTE — Patient Instructions (Signed)
Go to the basement for labs today  You have been scheduled for a flexible sigmoidoscopy. Please follow the written instructions given to you at your visit today. If you use inhalers (even only as needed), please bring them with you on the day of your procedure.  We will send refills on Rowasa Enemas and Cortisone suppositories   Follow up on 09-19-2015 at 8:30am with Dr Silverio Decamp If you have problems before your appt please contact the office

## 2015-07-13 NOTE — Progress Notes (Signed)
Kaitlyn Jennings    [id number]    08-Jun-1961  Primary Care Physician:Kaitlyn B., MD  Referring Physician: Lilian Coma, MD [name], [place] [address]  Chief complaint: Ulcerative colitis  HPI: 54 yr F with h/o left sided ulcerative colitis diagnosed in 1998 here for follow up with persistent symptoms of diarrhea and  blood mixed in stool as well as bright red blood per rectum for the past 1 year. She is currently on Humira 77m every 2 weeks and also does Prednisone pulse after Humira 15/10/5 mg  for 3 days. She is currently on prednisone taper for past 1 week for bronchitis. No improvement with prednisone.  Denies fever, chills, nausea or vomiting.    Outpatient Encounter Prescriptions as of 07/13/2015  Medication Sig  . Adalimumab (HUMIRA PEN) 40 MG/0.8ML PNKT Inject 1 pen into the skin every 14 (fourteen) days.  .Marland Kitchenalbuterol (PROVENTIL) (2.5 MG/3ML) 0.083% nebulizer solution Take 2.5 mg by nebulization as needed.    . Calcium Carbonate-Vitamin D (CALCIUM 500 + D PO) Take 1 capsule by mouth daily.    . Cholecalciferol (VITAMIN D) 1000 UNITS capsule Take 1,000 Units by mouth daily.    .Marland Kitchendicyclomine (BENTYL) 20 MG tablet TAKE ONE TABLET BY MOUTH 4 TIMES DAILY AS NEEDED FOR  SPAMS  . ferrous sulfate 325 (65 FE) MG tablet Take 325 mg by mouth daily with breakfast.    . fluticasone-salmeterol (ADVAIR HFA) 115-21 MCG/ACT inhaler Inhale 2 puffs into the lungs 2 (two) times daily.  . folic acid (FOLVITE) 1 MG tablet Take 1 tablet (1 mg total) by mouth daily.  .Marland KitchenHYDROcodone-acetaminophen (NORCO/VICODIN) 5-325 MG per tablet TAKE ONE TABLET BY MOUTH TWICE DAILY (Patient taking differently: TAKE ONE TABLET BY MOUTH TWICE DAILY prn)  . hydrocortisone (ANUSOL-HC) 25 MG suppository Insert one rectally 3 times a week  . hydrocortisone-pramoxine (ANALPRAM-HC) 2.5-1 % rectal cream Place 1 application rectally 3 (three) times daily as needed for hemorrhoids or  itching.  . loratadine (CLARITIN) 10 MG tablet Take 10 mg by mouth daily as needed for allergies.  . mesalamine (ROWASA) 4 G enema Use one enema twice a week  . norgestrel-ethinyl estradiol (LO/OVRAL) 0.3-30 MG-MCG per tablet Take 1 tablet by mouth daily.    .Marland Kitchenomeprazole (PRILOSEC) 40 MG capsule Take 1 capsule (40 mg total) by mouth daily.  . ondansetron (ZOFRAN) 4 MG tablet Take 4 mg by mouth every 8 (eight) hours as needed.    . predniSONE (DELTASONE) 10 MG tablet Take 20 mg po daily x 2 weeks, the 15 mg po daily x 2 weeks then 10 mg po daily x 2 weeks then 5 mg po daily x 2 weeks. (Patient taking differently: Take 15 mg x 1 day, 10 mg x 1 day, 5 mg x 1 day after humira)  . Probiotic Product (ALIGN) 4 MG CAPS Take 1 capsule by mouth daily.  . promethazine (PHENERGAN) 25 MG tablet Take 1 tablet (25 mg total) by mouth every 6 (six) hours as needed.  . rosuvastatin (CRESTOR) 10 MG tablet Take 10 mg by mouth daily.    . sucralfate (CARAFATE) 1 G tablet Take 2 tabs by mouth twice a day  . sulfaSALAzine (AZULFIDINE) 500 MG tablet Take 2 tablets by mouth twice daily  . vitamin B-12 (CYANOCOBALAMIN) 1000 MCG tablet Take 2 tablets by mouth once daily   . [DISCONTINUED] hydrocortisone (ANUSOL-HC) 25 MG suppository Insert  one rectally 3 times a week  . [DISCONTINUED] mesalamine (ROWASA) 4 G enema Use one enema twice a week  . polyethylene glycol powder (GLYCOLAX/MIRALAX) powder Take 1 to 1 and 1/2 capfuls dissolved by at least 8 ounces of water/juice and drink as needed  . sertraline (ZOLOFT) 100 MG tablet Take 1.5 tab (15m) daily for 2 weeks then 2 tabs daily (2040m thereafter (Patient taking differently: 200 mg. )  . [DISCONTINUED] beclomethasone (QVAR) 80 MCG/ACT inhaler Inhale 1 puff into the lungs 2 (two) times daily.  . [DISCONTINUED] metroNIDAZOLE (FLAGYL) 250 MG tablet Take 1 tablet (250 mg total) by mouth 3 (three) times daily.  . [DISCONTINUED] predniSONE (DELTASONE) 5 MG tablet Take as  directed   No facility-administered encounter medications on file as of 07/13/2015.    Allergies as of 07/13/2015 - Review Complete 07/13/2015  Allergen Reaction Noted  . Molds & smuts  02/09/2013  . Remicade [infliximab] Other (See Comments) 08/26/2006    Past Medical History  Diagnosis Date  . IBS (irritable bowel syndrome)   . Arthritis   . Anxiety   . Pancreatitis   . Ulcerative colitis   . B12 deficiency   . Chronic gastritis     Past Surgical History  Procedure Laterality Date  . Breast cyst aspiration      left  . Sinus surgery with instatrak    . Esophagus surgery      Family History  Problem Relation Age of Onset  . Colon cancer Neg Hx   . Diabetes Maternal Grandfather   . Irritable bowel syndrome Father   . COPD Father     Social History   Social History  . Marital Status: Married    Spouse Name: N/A  . Number of Children: N/A  . Years of Education: N/A   Occupational History  . FLIGHT ATTENDANT    Social History Main Topics  . Smoking status: Never Smoker   . Smokeless tobacco: Never Used  . Alcohol Use: No  . Drug Use: No  . Sexual Activity: Not on file   Other Topics Concern  . Not on file   Social History Narrative      Review of systems: Review of Systems  Constitutional: Negative for fever and chills.  HENT: Negative.   Eyes: Negative for blurred vision.  Respiratory: Negative for cough, shortness of breath and wheezing.   Cardiovascular: Negative for chest pain and palpitations.  Gastrointestinal: as per HPI Genitourinary: Negative for dysuria, urgency, frequency and hematuria.  Musculoskeletal: Negative for myalgias, back pain and joint pain.  Skin: Negative for itching and rash.  Neurological: Negative for dizziness, tremors, focal weakness, seizures and loss of consciousness.  Endo/Heme/Allergies: Negative for environmental allergies.  Psychiatric/Behavioral: Negative for depression, suicidal ideas and hallucinations.    All other systems reviewed and are negative.   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 Biopsies taken 2. Mild colitis from 25-35 cm status post biopsies  3. Normal appearing descending transverse and ascending colon including cecum and ileocecal valve. Status post random biopsies   Assessment and Plan/Recommendations: 54 year old female with history of left-sided colitis on Humira 40 mg every other week with persistent symptoms of diarrhea and blood in stool. We'll check level of adalimumab. Schedule for flexible sigmoidoscopy with biopsies to rule out CMV Advised patient to stop prednisone given no significant improvement Rowasa enema daily with Cortisone suppository at bedtime for 2 weeks Stop prednisone May have to consider switching to [facility]

## 2015-07-17 ENCOUNTER — Other Ambulatory Visit: Payer: Medicare Other

## 2015-07-17 DIAGNOSIS — K5180 Other ulcerative colitis without complications: Secondary | ICD-10-CM

## 2015-07-18 LAB — CLOSTRIDIUM DIFFICILE BY PCR: CDIFFPCR: NOT DETECTED

## 2015-07-20 ENCOUNTER — Other Ambulatory Visit: Payer: Self-pay

## 2015-07-20 DIAGNOSIS — K51018 Ulcerative (chronic) pancolitis with other complication: Secondary | ICD-10-CM

## 2015-07-21 LAB — ADALIMUMAB+AB (SERIAL MONITOR)
ANTI-ADALIMUMAB ANTIBODY: 1924 ng/mL — AB
Adalimumab Drug Level: <0.6 ug/mL

## 2015-07-23 ENCOUNTER — Other Ambulatory Visit (INDEPENDENT_AMBULATORY_CARE_PROVIDER_SITE_OTHER): Payer: Medicare Other

## 2015-07-23 ENCOUNTER — Telehealth: Payer: Self-pay | Admitting: Gastroenterology

## 2015-07-23 DIAGNOSIS — E876 Hypokalemia: Secondary | ICD-10-CM | POA: Diagnosis not present

## 2015-07-23 LAB — BASIC METABOLIC PANEL
BUN: 9 mg/dL (ref 6–23)
CHLORIDE: 103 meq/L (ref 96–112)
CO2: 25 mEq/L (ref 19–32)
CREATININE: 0.61 mg/dL (ref 0.40–1.20)
Calcium: 9.8 mg/dL (ref 8.4–10.5)
GFR: 108.24 mL/min (ref >60.00)
Glucose, Bld: 119 mg/dL — ABNORMAL HIGH (ref 70–99)
Potassium: 3.7 mEq/L (ref 3.5–5.1)
Sodium: 137 mEq/L (ref 135–145)

## 2015-07-23 NOTE — Telephone Encounter (Signed)
Fever and on antibiotics. Asthma flare also. She will call when the fever is gone.

## 2015-07-25 ENCOUNTER — Encounter (HOSPITAL_COMMUNITY): Admission: RE | Payer: Self-pay | Source: Ambulatory Visit

## 2015-07-25 ENCOUNTER — Ambulatory Visit (HOSPITAL_COMMUNITY): Admission: RE | Admit: 2015-07-25 | Payer: Medicare Other | Source: Ambulatory Visit

## 2015-07-25 SURGERY — SIGMOIDOSCOPY, FLEXIBLE
Anesthesia: Moderate Sedation

## 2015-07-25 NOTE — Progress Notes (Signed)
Can she an extender sooner? Thanks

## 2015-08-20 ENCOUNTER — Telehealth: Payer: Self-pay | Admitting: *Deleted

## 2015-08-20 ENCOUNTER — Telehealth: Payer: Self-pay | Admitting: Gastroenterology

## 2015-08-20 ENCOUNTER — Other Ambulatory Visit: Payer: Medicare Other | Admitting: Gastroenterology

## 2015-08-20 NOTE — Telephone Encounter (Signed)
Patient spoke with [facility] nurse. Patient is sick. Procedure is cancelled.

## 2015-08-20 NOTE — Telephone Encounter (Signed)
Spoke with patient and she is very congested and running a fever of 100.7.  She states that she "feels terrible." She prefers to no come in today, and will reschedule.  She even stated that she would be willing to pay the $100 for the inconvenience. I assured her that it probably was not necessary since she is sick.

## 2015-09-11 ENCOUNTER — Telehealth: Payer: Self-pay | Admitting: Gastroenterology

## 2015-09-11 NOTE — Telephone Encounter (Signed)
Rescheduled the flex-sig to 10/01/15 at 9:00 am. Arrive at 8:00 am. We reviewed her instructions.

## 2015-09-14 ENCOUNTER — Other Ambulatory Visit: Payer: Medicare Other | Admitting: Gastroenterology

## 2015-09-19 ENCOUNTER — Ambulatory Visit: Payer: Medicare Other | Admitting: Gastroenterology

## 2015-09-25 ENCOUNTER — Ambulatory Visit: Payer: Medicare Other | Admitting: Gastroenterology

## 2015-10-01 ENCOUNTER — Encounter: Payer: Self-pay | Admitting: Gastroenterology

## 2015-10-01 ENCOUNTER — Ambulatory Visit (AMBULATORY_SURGERY_CENTER): Payer: Medicare Other | Admitting: Gastroenterology

## 2015-10-01 VITALS — BP 157/79 | HR 66 | Temp 99.4°F | Resp 20 | Ht 68.5 in | Wt 222.0 lb

## 2015-10-01 DIAGNOSIS — K5190 Ulcerative colitis, unspecified, without complications: Secondary | ICD-10-CM

## 2015-10-01 DIAGNOSIS — K621 Rectal polyp: Secondary | ICD-10-CM

## 2015-10-01 DIAGNOSIS — D128 Benign neoplasm of rectum: Secondary | ICD-10-CM

## 2015-10-01 HISTORY — DX: Rectal polyp: K62.1

## 2015-10-01 MED ORDER — SODIUM CHLORIDE 0.9 % IV SOLN: 500.0000 mL | INTRAVENOUS | Status: DC

## 2015-10-01 MED ORDER — MESALAMINE 4 G RE ENEM: 4.0000 g | ENEMA | Freq: Every day | RECTAL | Status: DC

## 2015-10-01 NOTE — Progress Notes (Signed)
Patient denies any allergies to eggs or soy. 

## 2015-10-01 NOTE — Progress Notes (Signed)
Stable to RR 

## 2015-10-01 NOTE — Progress Notes (Signed)
Called to room to assist during endoscopic procedure.  Patient ID and intended procedure confirmed with present staff. Received instructions for my participation in the procedure from the performing physician.  

## 2015-10-01 NOTE — Patient Instructions (Signed)
YOU HAD AN ENDOSCOPIC PROCEDURE TODAY AT THE [facility]:   Refer to the procedure report that was given to you for any specific questions about what was found during the examination.  If the procedure report does not answer your questions, please call your gastroenterologist to clarify.  If you requested that your care partner not be given the details of your procedure findings, then the procedure report has been included in a sealed envelope for you to review at your convenience later.  YOU SHOULD EXPECT: Some feelings of bloating in the abdomen. Passage of more gas than usual.  Walking can help get rid of the air that was put into your GI tract during the procedure and reduce the bloating. If you had a lower endoscopy (such as a colonoscopy or flexible sigmoidoscopy) you may notice spotting of blood in your stool or on the toilet paper. If you underwent a bowel prep for your procedure, you may not have a normal bowel movement for a few days.  Please Note:  You might notice some irritation and congestion in your nose or some drainage.  This is from the oxygen used during your procedure.  There is no need for concern and it should clear up in a day or so.  SYMPTOMS TO REPORT IMMEDIATELY:   Following lower endoscopy (colonoscopy or flexible sigmoidoscopy):  Excessive amounts of blood in the stool  Significant tenderness or worsening of abdominal pains  Swelling of the abdomen that is new, acute  Fever of 100F or higher   For urgent or emergent issues, a gastroenterologist can be reached at any hour by calling [phone].   DIET: Your first meal following the procedure should be a small meal and then it is ok to progress to your normal diet. Heavy or fried foods are harder to digest and may make you feel nauseous or bloated.  Likewise, meals heavy in dairy and vegetables can increase bloating.  Drink plenty of fluids but you should avoid alcoholic beverages for 24  hours.  ACTIVITY:  You should plan to take it easy for the rest of today and you should NOT DRIVE or use heavy machinery until tomorrow (because of the sedation medicines used during the test).    FOLLOW UP: Our staff will call the number listed on your records the next business day following your procedure to check on you and address any questions or concerns that you may have regarding the information given to you following your procedure. If we do not reach you, we will leave a message.  However, if you are feeling well and you are not experiencing any problems, there is no need to return our call.  We will assume that you have returned to your regular daily activities without incident.  If any biopsies were taken you will be contacted by phone or by letter within the next 1-3 weeks.  Please call us at [phone] if you have not heard about the biopsies in 3 weeks.    SIGNATURES/CONFIDENTIALITY: You and/or your care partner have signed paperwork which will be entered into your electronic medical record.  These signatures attest to the fact that that the information above on your After Visit Summary has been reviewed and is understood.  Full responsibility of the confidentiality of this discharge information lies with you and/or your care-partner.   Resume medications. Information given on polyps and diverticulosis. 

## 2015-10-01 NOTE — Op Note (Signed)
[place]  [address] [place], [id number]   FLEX SIGMOIDOSCOPY PROCEDURE REPORT  PATIENT: Kaitlyn Jennings, Kaitlyn Jennings  MR#: [id number] BIRTHDATE: [birth year] , 51  yrs. old GENDER: female ENDOSCOPIST: Harl Bowie, MD REFERRED [id number] Valora Piccolo, M.D. PROCEDURE DATE:  10/01/2015 PROCEDURE:   Sigmoidoscopy with biopsy and Sigmoidoscopy with snare  INDICATIONS:high risk previously diagnosed UC proctosigmoid and follow up for previously diagnosed colitis. MEDICATIONS: Propofol 350 mg IV and Lidocaine 40 mg IV  DESCRIPTION OF PROCEDURE:    Physical exam was performed.  Informed consent was obtained from the patient after explaining the benefits, risks, and alternatives to procedure.  The patient was connected to monitor and placed in left lateral position. Continuous oxygen was provided by nasal cannula and IV medicine administered through an indwelling cannula.  After administration of sedation and rectal exam, the patients rectum was intubated and the [id number]  colonoscope was advanced under direct visualization to the cecum.  The scope was removed slowly by carefully examining the color, texture, anatomy, and integrity mucosa on the way out.  The patient was recovered in endoscopy and discharged home in satisfactory condition. Estimated blood loss is zero unless otherwise noted in this procedure report.    COLON FINDINGS: Scope was extended up to splenic flexure.  Mucosa appeared normal at splenic flexure and descending colon up to 30 cm from anal verge, mild erythema with vascularity changes at 30 cm and sigmoid colon to 15 cm in the rectum.mild sigmoid diverticulosis.The mucosa in rectum is friable with evidence of moderate to severe colitis.  random biopsies were obtained from the sigmoid and rectal mucosa.5 mm polyp was removed with cold snare. retroflexion could not be performed due to narrow rectal vault.  PREP QUALITY: The overall prep quality was  good.  COMPLICATIONS: None  ENDOSCOPIC IMPRESSION: Scope was extended up to splenic flexure.  Mucosa appeared normal at splenic flexure and descending colon up to 30 cm from anal verge, mild erythema with vascularity changes at 30 cm and sigmoid colon to 15 cm in the rectum.mild sigmoid diverticulosis.The mucosa in rectum is friable with evidence of moderate to severe colitis. random biopsies were obtained from the sigmoid and rectal mucosa.5 mm polyp was removed with cold snare.  RECOMMENDATIONS: 1.  Await biopsy results 2.  Continue surveillance in 1 year     _______________________________ eSigned:  Harl Bowie, MD 10/01/2015 9:28 AM        PATIENT NAME:  Kaitlyn, Jennings MR#: [id number]

## 2015-10-02 ENCOUNTER — Telehealth: Payer: Self-pay | Admitting: *Deleted

## 2015-10-02 NOTE — Telephone Encounter (Signed)
  Follow up Call-  Call back number 10/01/2015 07/07/2014  Post procedure Call Back phone  # [phone]  Permission to leave phone message Yes Yes     Patient questions:  Do you have a fever, pain , or abdominal swelling? yes Pain Score  3 *  Have you tolerated food without any problems? Yes.    Have you been able to return to your normal activities? Yes.    Do you have any questions about your discharge instructions: Diet   No. Medications  No. Follow up visit  No.  Do you have questions or concerns about your Care? No.  Actions: * If pain score is 4 or above: No action needed, pain <4.

## 2015-10-04 ENCOUNTER — Telehealth: Payer: Self-pay | Admitting: Gastroenterology

## 2015-10-04 MED ORDER — MESALAMINE 1000 MG RE SUPP: 1000.0000 mg | Freq: Every day | RECTAL | Status: DC

## 2015-10-04 NOTE — Telephone Encounter (Signed)
Dr Nandigam Please advise  

## 2015-10-04 NOTE — Telephone Encounter (Signed)
Called patient to inform that canasa was sent to [facility]

## 2015-10-04 NOTE — Telephone Encounter (Signed)
We can send Rx Canasa suppository at bedtime for 2 weeks. Thanks

## 2015-10-10 ENCOUNTER — Encounter: Payer: Self-pay | Admitting: *Deleted

## 2015-10-10 ENCOUNTER — Encounter: Payer: Self-pay | Admitting: Gastroenterology

## 2015-10-12 ENCOUNTER — Telehealth: Payer: Self-pay | Admitting: Gastroenterology

## 2015-10-12 NOTE — Telephone Encounter (Signed)
We sent in Canasa suppositories in for her on 10/04/2015

## 2015-10-17 ENCOUNTER — Telehealth: Payer: Self-pay | Admitting: Gastroenterology

## 2015-10-17 MED ORDER — DICYCLOMINE HCL 20 MG PO TABS: 20.0000 mg | ORAL_TABLET | Freq: Three times a day (TID) | ORAL | Status: DC

## 2015-10-17 NOTE — Telephone Encounter (Signed)
Done

## 2015-10-17 NOTE — Telephone Encounter (Signed)
Called patient to inform her Bentyl was sent to [facility]   Follow up in 3 months

## 2015-10-30 ENCOUNTER — Telehealth: Payer: Self-pay | Admitting: Gastroenterology

## 2015-10-30 NOTE — Telephone Encounter (Signed)
There is some confusion on what she is supposed to be using to treat the UC. She is on Canasa and Humira. She ran out of the enemas at least a week ago. She has symptoms of minor cramping and is starting to see bloo(slight) again. The enemas are extremely expensive. 7 costs her $300.00 Please clarify what she is supposed to be treating with.

## 2015-10-31 NOTE — Telephone Encounter (Signed)
There are no APP appointments. I do not have anywhere to schedule her with any provider.

## 2015-10-31 NOTE — Telephone Encounter (Signed)
Can we please try to schedule her for a visit to assess her symptoms and ok to schedule with APP. She can stop the enemas, Continue Humira

## 2015-10-31 NOTE — Telephone Encounter (Signed)
Can you try to bring her in case of any cancellations. thanks

## 2015-11-01 ENCOUNTER — Encounter: Payer: Self-pay | Admitting: Internal Medicine

## 2015-11-01 NOTE — Telephone Encounter (Signed)
Spoke with the patient so she is aware of the plan. She is on prednisone right now for an asthma/URI issue.She reports she is doing okay with her UC symptoms. They are not worsening.

## 2015-11-13 ENCOUNTER — Telehealth: Payer: Self-pay | Admitting: Gastroenterology

## 2015-11-13 NOTE — Telephone Encounter (Signed)
Patient is still on steroids for her URI and asthma flare. However, I encouraged her to keep the appointment so there can be a plan in place for when she does come off of the steroids. UC symptoms are quiet right now.

## 2015-11-14 ENCOUNTER — Telehealth: Payer: Self-pay | Admitting: Physician Assistant

## 2015-11-14 ENCOUNTER — Ambulatory Visit (INDEPENDENT_AMBULATORY_CARE_PROVIDER_SITE_OTHER): Payer: Medicare Other | Admitting: Physician Assistant

## 2015-11-14 ENCOUNTER — Other Ambulatory Visit (INDEPENDENT_AMBULATORY_CARE_PROVIDER_SITE_OTHER): Payer: Medicare Other

## 2015-11-14 ENCOUNTER — Encounter: Payer: Self-pay | Admitting: Physician Assistant

## 2015-11-14 VITALS — BP 158/82 | HR 70 | Ht 67.5 in | Wt 214.0 lb

## 2015-11-14 DIAGNOSIS — K5010 Crohn's disease of large intestine without complications: Secondary | ICD-10-CM | POA: Diagnosis not present

## 2015-11-14 LAB — CBC WITH DIFFERENTIAL/PLATELET
BASOS PCT: 0.4 % (ref 0.0–3.0)
Basophils Absolute: 0 10*3/uL (ref 0.0–0.1)
EOS PCT: 1.6 % (ref 0.0–5.0)
Eosinophils Absolute: 0.2 10*3/uL (ref 0.0–0.7)
HCT: 40.8 % (ref 36.0–46.0)
HEMOGLOBIN: 13.8 g/dL (ref 12.0–15.0)
Lymphocytes Relative: 25.3 % (ref 12.0–46.0)
Lymphs Abs: 3.1 10*3/uL (ref 0.7–4.0)
MCHC: 33.9 g/dL (ref 30.0–36.0)
MCV: 84.1 fl (ref 78.0–100.0)
MONO ABS: 0.7 10*3/uL (ref 0.1–1.0)
MONOS PCT: 5.7 % (ref 3.0–12.0)
Neutro Abs: 8.2 10*3/uL — ABNORMAL HIGH (ref 1.4–7.7)
Neutrophils Relative %: 67 % (ref 43.0–77.0)
Platelets: 406 10*3/uL — ABNORMAL HIGH (ref 150.0–400.0)
RBC: 4.85 Mil/uL (ref 3.87–5.11)
RDW: 13.1 % (ref 11.5–15.5)
WBC: 12.2 10*3/uL — AB (ref 4.0–10.5)

## 2015-11-14 LAB — C-REACTIVE PROTEIN: CRP: 0.5 mg/dL (ref 0.5–20.0)

## 2015-11-14 MED ORDER — HYDROCODONE-ACETAMINOPHEN 5-325 MG PO TABS: 1.0000 | ORAL_TABLET | Freq: Four times a day (QID) | ORAL | Status: DC | PRN

## 2015-11-14 MED ORDER — BUDESONIDE 9 MG PO TB24: 1.0000 | ORAL_TABLET | Freq: Every morning | ORAL | Status: DC

## 2015-11-14 MED ORDER — MESALAMINE 1000 MG RE SUPP: 1000.0000 mg | Freq: Every day | RECTAL | Status: DC

## 2015-11-14 NOTE — Patient Instructions (Signed)
Please go to the basement level to have your labs drawn.  We sent refills for the Canasa Suppositories to [facility]. We also sent a prescription for the Uceris 9 mg tablets. We have given you a signed prescripton to take to [facility] for the Vicodin pain medication.

## 2015-11-14 NOTE — Progress Notes (Signed)
Patient ID: Kaitlyn Jennings, female   DOB: [birth year], 55 y.o.   MRN: [id number]   Subjective:    Patient ID: Kaitlyn Jennings, female    DOB: 02-27-1961, 55 y.o.   MRN: [id number]  HPI  Kaitlyn Jennings is a pleasant 55 year old white female now established with Dr.Nandigam who has long history of ulcerative colitis initially diagnosed in 1998. She has been on Humira over the past 6 years. She underwent flexible sigmoidoscopy on 10/01/2015 with finding of moderate or her rectal disease, there was 1.5 mm polyp removed and she also had mild-to-moderate colitis above 30 cm. Biopsies  Showed chronic active colitis. She was continued on Humira, and started on Rowasa enemas. She says she was unable to get these because they were too expensive and is now using Canasa suppositories at bedtime. Exline She has required prednisone over the past week or so because of an upper respiratory infection and says that has helped her GI symptoms are little. She says her colitis has not been under control over at least the past year. She has ongoing daily symptoms with abdominal discomfort cramping and 3-4 loose bowel movements per day. She has not had any recent bleeding. Patient says she thinks her symptoms started when Humira dosing was decreased to once monthly after she had gone for a long period of time without any symptoms. About a year ago when she became symptomatic the Humira was increased to usual dose of every 2 weeks but she has been unable to get her symptoms under control.  Review of Systems Pertinent positive and negative review of systems were noted in the above HPI section.  All other review of systems was otherwise negative.  Outpatient Encounter Prescriptions as of 11/14/2015  Medication Sig  . Adalimumab (HUMIRA PEN) 40 MG/0.8ML PNKT Inject 1 pen into the skin every 14 (fourteen) days.  Marland Kitchen albuterol (PROVENTIL) (2.5 MG/3ML) 0.083% nebulizer solution Take 2.5 mg by nebulization as needed.    . Calcium Carbonate-Vitamin  D (CALCIUM 500 + D PO) Take 1 capsule by mouth daily.    . Cholecalciferol (VITAMIN D) 1000 UNITS capsule Take 1,000 Units by mouth daily.    Marland Kitchen desvenlafaxine (PRISTIQ) 100 MG 24 hr tablet Take 100 mg by mouth daily.  Marland Kitchen dicyclomine (BENTYL) 20 MG tablet Take 1 tablet (20 mg total) by mouth 4 (four) times daily -  before meals and at bedtime.  . ferrous sulfate 325 (65 FE) MG tablet Take 325 mg by mouth daily with breakfast.    . fluticasone (FLONASE) 50 MCG/ACT nasal spray Place 2 sprays into both nostrils daily.  . fluticasone-salmeterol (ADVAIR HFA) 115-21 MCG/ACT inhaler Inhale 2 puffs into the lungs 2 (two) times daily.  . folic acid (FOLVITE) 1 MG tablet Take 1 tablet (1 mg total) by mouth daily.  Marland Kitchen HYDROcodone-acetaminophen (NORCO/VICODIN) 5-325 MG tablet Take 1 tablet by mouth every 6 (six) hours as needed for moderate pain. Take 1 tab every 6 hours as needed for pain.  . hydrocortisone (ANUSOL-HC) 25 MG suppository Insert one rectally 3 times a week  . hydrocortisone-pramoxine (ANALPRAM-HC) 2.5-1 % rectal cream Place 1 application rectally 3 (three) times daily as needed for hemorrhoids or itching.  . loratadine (CLARITIN) 10 MG tablet Take 10 mg by mouth daily as needed for allergies.  . mesalamine (CANASA) 1000 MG suppository Place 1 suppository (1,000 mg total) rectally at bedtime.  . mesalamine (ROWASA) 4 g enema Place 60 mLs (4 g total) rectally at bedtime. Use one enema  twice a week  . norgestrel-ethinyl estradiol (LO/OVRAL) 0.3-30 MG-MCG per tablet Take 1 tablet by mouth daily.    Marland Kitchen omeprazole (PRILOSEC) 40 MG capsule Take 1 capsule (40 mg total) by mouth daily.  . ondansetron (ZOFRAN) 4 MG tablet Take 4 mg by mouth every 8 (eight) hours as needed.    . polyethylene glycol powder (GLYCOLAX/MIRALAX) powder Reported on 10/01/2015  . potassium chloride SA (KLOR-CON M20) 20 MEQ tablet Take two tablets by mouth daily for 4 days then 7mq daily thereafter  . Probiotic Product (ALIGN) 4 MG  CAPS Take 1 capsule by mouth daily.  . promethazine (PHENERGAN) 25 MG tablet Take 1 tablet (25 mg total) by mouth every 6 (six) hours as needed.  . rosuvastatin (CRESTOR) 10 MG tablet Take 10 mg by mouth daily.    . sucralfate (CARAFATE) 1 G tablet Take 2 tabs by mouth twice a day  . sulfaSALAzine (AZULFIDINE) 500 MG tablet Take 2 tablets by mouth twice daily  . valsartan (DIOVAN) 160 MG tablet Take 160 mg by mouth daily.  . vitamin B-12 (CYANOCOBALAMIN) 1000 MCG tablet Take 2 tablets by mouth once daily   . [DISCONTINUED] HYDROcodone-acetaminophen (NORCO/VICODIN) 5-325 MG per tablet TAKE ONE TABLET BY MOUTH TWICE DAILY (Patient taking differently: TAKE ONE TABLET BY MOUTH TWICE DAILY prn)  . [DISCONTINUED] mesalamine (CANASA) 1000 MG suppository Place 1 suppository (1,000 mg total) rectally at bedtime.  . Budesonide (UCERIS) 9 MG TB24 Take 1 tablet by mouth every morning.  . [DISCONTINUED] dicyclomine (BENTYL) 20 MG tablet TAKE ONE TABLET BY MOUTH 4 TIMES DAILY AS NEEDED FOR  SPAMS (Patient not taking: Reported on 11/14/2015)   No facility-administered encounter medications on file as of 11/14/2015.   Allergies  Allergen Reactions  . Molds & Smuts   . Remicade [Infliximab] Other (See Comments)    REACTION: lupus, joint pain   Patient Active Problem List   Diagnosis Date Noted  . B12 DEFICIENCY 03/26/2010  . GASTRITIS 02/04/2008  . ABDOMINAL PAIN, EPIGASTRIC 02/04/2008  . ANXIETY 09/25/2007  . ULCERATIVE COLITIS 09/25/2007  . IRRITABLE BOWEL SYNDROME 09/25/2007  . ARTHRITIS 09/25/2007  . PANCREATITIS, ACUTE, HX OF 09/25/2007   Social History   Social History  . Marital Status: Married    Spouse Name: N/A  . Number of Children: N/A  . Years of Education: N/A   Occupational History  . FLIGHT ATTENDANT    Social History Main Topics  . Smoking status: Never Smoker   . Smokeless tobacco: Never Used  . Alcohol Use: No  . Drug Use: No  . Sexual Activity: Not on file   Other  Topics Concern  . Not on file   Social History Narrative    Ms. Lapid's family history includes COPD in her father; Diabetes in her maternal grandfather; Irritable bowel syndrome in her father. There is no history of Colon cancer.      Objective:    Filed Vitals:   11/14/15 0827  BP: 158/82  Pulse: 70    Physical Exam  well-developed white female in no acute distress, pleasant blood pressure 158/82 pulse 70 height 5 foot 7 weight 214. HEENT; nontraumatic normocephalic EOMI PERRLA sclera anicteric, Cardiovascular; regular rate and rhythm with S1-S2 no murmur or gallop, Pulmonary; clear bilaterally, Abdomen; soft she is tender in the left mid quadrant there is no guarding or rebound no palpable mass or hepatosplenomegaly, Rectal ;exam not done, Neuropsych ;mood and affect appropriate     Assessment & Plan:   #  1 55 yo female with longstanding left sided ulcerative colitis poorly controlled on Humira 40 mg SQ q 2 weeks Question if she has developed antibodies to adalimumab   Plan; for now continue Humira 40 mg subcutaneous every 2 weeks, she is due for dosing this weekend. Will check adalimumab antibody level and concentration Add U Sarris 9 mg by mouth every morning Continue Canasa suppositories at bedtime Patient asked for short-term prescription for Vicodin for both abdominal and back pain, prescribed 01/09/2024 one every 6 hours as needed for pain #50 and no refills Suspect will need to switch her to another biologic, await  pending labs Patient will follow-up with Dr. Silverio Decamp in one month.   Amy S Esterwood PA-C 11/14/2015   Cc: Lilian Coma., MD

## 2015-11-14 NOTE — Progress Notes (Signed)
Reviewed and agree with documentation and assessment and plan. May have to consider Entyvio or Stelara if continues to have persistent symptoms K. Denzil Magnuson , MD

## 2015-11-15 NOTE — Telephone Encounter (Signed)
The patient said the price of the Uceris 9 mg was $1900.00.  I told her I would try to do a prior authorization . I told her I will call her next week once I hear back from [facility].  I advised I didn't know what the outcome might be but will give it a shot.

## 2015-11-19 ENCOUNTER — Other Ambulatory Visit: Payer: Medicare Other

## 2015-11-19 ENCOUNTER — Telehealth: Payer: Self-pay | Admitting: *Deleted

## 2015-11-19 ENCOUNTER — Other Ambulatory Visit: Payer: Self-pay | Admitting: *Deleted

## 2015-11-19 ENCOUNTER — Other Ambulatory Visit: Payer: Self-pay | Admitting: Physician Assistant

## 2015-11-19 DIAGNOSIS — K5010 Crohn's disease of large intestine without complications: Secondary | ICD-10-CM

## 2015-11-19 NOTE — Telephone Encounter (Signed)
Put in order for the test Nicoletta Ba PA wanted.  [employer] # [id number] under Misc test.  Filled out [employer] paperwork and hand carried it down to the lab and gave it to [place].  Diagnosis code K50.10, Crohn's Colitis.

## 2015-11-21 LAB — PROMETHEUS-MAIL

## 2015-11-21 LAB — OTHER SOLSTAS TEST

## 2015-11-23 ENCOUNTER — Telehealth: Payer: Self-pay | Admitting: Gastroenterology

## 2015-11-23 ENCOUNTER — Other Ambulatory Visit: Payer: Self-pay | Admitting: *Deleted

## 2015-11-23 ENCOUNTER — Telehealth: Payer: Self-pay | Admitting: *Deleted

## 2015-11-23 NOTE — Telephone Encounter (Signed)
I called the patient to advise I am writing an appeal letter . We received a fax stating the request for Uceris was denied.  I noted in my letter, they suggested she try sulfasalazine. I reiterated in my letter that she has been on sulfasalazine for the last 10 years. I faxed the letter and Amy's last office notes to Department of Health and Financial controller, [employer] on 11-23-2015.

## 2015-11-23 NOTE — Telephone Encounter (Signed)
Patient is aware of the plan. She has had an insurance change and must file new disability forms with them. She will bring the forms by to have them interoffice mailed to [facility]. Also, benefits investigation for Entyvio-copay assistance forms will need to be signed.  Co-pay assistance form for Uceris to be signed.

## 2015-11-23 NOTE — Telephone Encounter (Signed)
Medications have been very difficult for her to afford. She came to see Nicoletta Ba, PA to follow up on her active Crohn's. She is now tapering off of the prednisone that she was on for an asthma flare. She will not be starting on Uceris unless she can find patient assistance to help with the cost. Prior to the prednisone she was having abdominal cramping that described as tolerable. Symptoms are gone on the prednisone. She is on Humira and Canasa suppositories. Should she taper the prednisone as planned by Pulmonology?

## 2015-11-23 NOTE — Telephone Encounter (Signed)
She can come down on Prednisone taper. We will have to switch her to [facility] given no response and she has elevated Ab to Humira. Can we try to get her scheduled for Entyvio soon?

## 2015-11-26 NOTE — Telephone Encounter (Signed)
Entyvio Connect forms signed and faxed. Patient will send the form that has her financial information on it.

## 2015-12-03 ENCOUNTER — Encounter: Payer: Self-pay | Admitting: Physician Assistant

## 2015-12-07 ENCOUNTER — Other Ambulatory Visit: Payer: Self-pay

## 2015-12-07 ENCOUNTER — Telehealth: Payer: Self-pay | Admitting: Gastroenterology

## 2015-12-07 DIAGNOSIS — K50919 Crohn's disease, unspecified, with unspecified complications: Secondary | ICD-10-CM

## 2015-12-07 NOTE — Telephone Encounter (Signed)
She is not eligible for finacial assistance. I have received the rejection letter from [facility]. She has a Medicare plan.

## 2015-12-07 NOTE — Telephone Encounter (Signed)
Discussed the coverage on Entyvio with the patient. Her co-pay for the medication will be $340 per infusion not including the hospital charge.  The patient had a severe reaction in 2007 to Remicade.

## 2015-12-07 NOTE — Telephone Encounter (Signed)
Patient notified. Benefits investigation started on Stelara.

## 2015-12-07 NOTE — Telephone Encounter (Signed)
No cross reaction between Entyvio and Remicade, slightly different mechanism of action. Is she eligible for any financial assistance, Eustaquio Maize can you please check? Thanks

## 2015-12-07 NOTE — Telephone Encounter (Signed)
We can try Stelara instead and check if her out of pocket expenses will be lower. Thanks

## 2015-12-12 ENCOUNTER — Telehealth: Payer: Self-pay | Admitting: *Deleted

## 2015-12-12 MED ORDER — OMEPRAZOLE 40 MG PO CPDR: 40.0000 mg | DELAYED_RELEASE_CAPSULE | Freq: Every day | ORAL | Status: DC

## 2015-12-12 NOTE — Telephone Encounter (Signed)
Fax request from pharmacy refilled omeprazole electronically

## 2015-12-19 ENCOUNTER — Other Ambulatory Visit: Payer: Self-pay

## 2015-12-19 ENCOUNTER — Telehealth: Payer: Self-pay | Admitting: Gastroenterology

## 2015-12-19 DIAGNOSIS — K529 Noninfective gastroenteritis and colitis, unspecified: Secondary | ICD-10-CM

## 2015-12-19 MED ORDER — PREDNISONE 10 MG PO TABS: 20.0000 mg | ORAL_TABLET | Freq: Every day | ORAL | Status: DC

## 2015-12-19 NOTE — Telephone Encounter (Signed)
Stelara infusion is scheduled for 01/11/16. PA has been re-sent for Uceris. The patient is using suppositories. Her symptoms are not controled. She is having lower abdominal cramping and passing some blood with the bowel movements. Dr Silverio Decamp advises to begin Prednisone 20 mg q day for 7 days and taper by 5 mg every 7 days. Maintain on 5 mg until after the Stelara infusion. Re-evaluate at that time. Patient agrees to this plan.

## 2015-12-19 NOTE — Telephone Encounter (Signed)
Approval for the Stelara injections. Infusion is scheduled for 01/11/16. Patient is aware.

## 2015-12-19 NOTE — Telephone Encounter (Signed)
Stelara benefits investigation shows no PA is needed for the hospital infusion. PA is no in process for the home SQ injections of Stelara every 8 weeks.

## 2015-12-26 ENCOUNTER — Ambulatory Visit: Payer: Medicare Other | Admitting: Gastroenterology

## 2016-01-10 ENCOUNTER — Telehealth: Payer: Self-pay | Admitting: Gastroenterology

## 2016-01-10 DIAGNOSIS — R5081 Fever presenting with conditions classified elsewhere: Secondary | ICD-10-CM

## 2016-01-10 DIAGNOSIS — R197 Diarrhea, unspecified: Secondary | ICD-10-CM

## 2016-01-10 NOTE — Telephone Encounter (Signed)
Sudden flare with diarrhea (numerous diarrhea stools) painful need to push for bowel movements, stool urgency, fever and nausea. Per Dr Silverio Decamp the Stelara injection is postponed. Stool for C-diff by PCR. Appointment to be seen asap. Patient is to go to the ER if she worsens before the appointment. She states understanding of the plan.

## 2016-01-11 ENCOUNTER — Encounter (HOSPITAL_COMMUNITY)
Admission: RE | Admit: 2016-01-11 | Discharge: 2016-01-11 | Disposition: A | Discharge status: Home / Self Care | Payer: Medicare Other | Source: Ambulatory Visit | Attending: Gastroenterology | Admitting: Gastroenterology

## 2016-01-11 ENCOUNTER — Encounter: Payer: Self-pay | Admitting: *Deleted

## 2016-01-11 DIAGNOSIS — K5150 Left sided colitis without complications: Secondary | ICD-10-CM | POA: Insufficient documentation

## 2016-01-11 NOTE — Telephone Encounter (Signed)
Patient feels same to maybe slightly better this morning. But she took Imodium last night so she could sleep a little. She will not repeat the Imodium until a liquid stool has been collected. No fever this morning.

## 2016-01-11 NOTE — Telephone Encounter (Signed)
No change through the day. Understands to go to the ER if she acutely worsens. Keep the appointment as scheduled.

## 2016-01-14 ENCOUNTER — Telehealth: Payer: Self-pay | Admitting: Gastroenterology

## 2016-01-14 ENCOUNTER — Ambulatory Visit: Payer: Medicare Other | Admitting: Physician Assistant

## 2016-01-14 NOTE — Telephone Encounter (Signed)
Have you seen any forms?

## 2016-01-14 NOTE — Telephone Encounter (Signed)
Reports her diarrhea has stopped. She has had semi-formed bowel movements for 3 days. Infusion rescheduled to 01/24/16 at 7:00 am at [facility]

## 2016-01-15 NOTE — Telephone Encounter (Signed)
Ok, thanks.

## 2016-01-15 NOTE — Telephone Encounter (Signed)
Paperwork for disability is on Dr Jillyn Hidden desk in [employer]. They have not been completed yet. Will call patient when the forms are ready for her to pick up. She wants to pick them up personally tomorrow once completed

## 2016-01-24 ENCOUNTER — Encounter (HOSPITAL_COMMUNITY)
Admission: RE | Admit: 2016-01-24 | Discharge: 2016-01-24 | Disposition: A | Discharge status: Home / Self Care | Payer: Medicare Other | Source: Ambulatory Visit | Attending: Gastroenterology | Admitting: Gastroenterology

## 2016-01-24 ENCOUNTER — Encounter (HOSPITAL_COMMUNITY): Payer: Self-pay

## 2016-01-24 VITALS — BP 139/89 | HR 73 | Temp 98.4°F | Resp 16 | Ht 68.5 in | Wt 214.0 lb

## 2016-01-24 DIAGNOSIS — K529 Noninfective gastroenteritis and colitis, unspecified: Secondary | ICD-10-CM

## 2016-01-24 DIAGNOSIS — K5150 Left sided colitis without complications: Secondary | ICD-10-CM | POA: Diagnosis not present

## 2016-01-24 HISTORY — DX: Reserved for concepts with insufficient information to code with codable children: IMO0002

## 2016-01-24 HISTORY — DX: Systemic lupus erythematosus, unspecified: M32.9

## 2016-01-24 MED ORDER — USTEKINUMAB 130 MG/26ML IV SOLN
520.0000 mg | Freq: Once | INTRAVENOUS | Status: AC
  Administered 2016-01-24: 520 mg via INTRAVENOUS
  Filled 2016-01-24: qty 104

## 2016-01-24 MED ORDER — SODIUM CHLORIDE 0.9 % IV SOLN
Freq: Once | INTRAVENOUS | Status: AC
  Administered 2016-01-24: 08:00:00 via INTRAVENOUS

## 2016-01-24 NOTE — Discharge Instructions (Signed)
Ustekinumab injection What is this medicine? USTEKINUMAB ([place] te KIN ue mab) is used to treat plaque psoriasis, psoriatic arthritis and crohns. It is not a cure. This medicine may be used for other purposes; ask your health care provider or pharmacist if you have questions. What should I tell my health care provider before I take this medicine? They need to know if you have any of these conditions: -cancer -diabetes -immune system problems -infection (especially a virus infection such as chickenpox, cold sores, or herpes)or history of infections -receiving or have received allergy shots -recently received or scheduled to receive a vaccine -tuberculosis, a positive skin test for tuberculosis, or have recently been in close contact with someone who has tuberculosis -an unusual reaction to ustekinumab, other medicines, foods, dyes, or preservatives -pregnant or trying to get pregnant -breast-feeding How should I use this medicine? This medicine is for injection under the skin. You will be taught how to prepare and give this medicine. Use exactly as directed. Take your medicine at regular intervals. Do not take your medicine more often than directed. It is important that you put your used needles and syringes in a special sharps container. Do not put them in a trash can. If you do not have a sharps container, call your pharmacist or healthcare provider to get one. A special MedGuide will be given to you by the pharmacist with each prescription and refill. Be sure to read this information carefully each time. Talk to your pediatrician regarding the use of this medicine in children. Special care may be needed. Overdosage: If you think you have taken too much of this medicine contact a poison control center or emergency room at once. NOTE: This medicine is only for you. Do not share this medicine with others. What if I miss a dose? If you miss a dose, take it as soon as you can. If it is almost time  for your next dose, take only that dose. Do not take double or extra doses. What may interact with this medicine? Do not take this medicine with any of the following medications: -live virus vaccines This medicine may also interact with the following medications: -cyclosporine -immunosuppressives -vaccines -warfarin This list may not describe all possible interactions. Give your health care provider a list of all the medicines, herbs, non-prescription drugs, or dietary supplements you use. Also tell them if you smoke, drink alcohol, or use illegal drugs. Some items may interact with your medicine. What should I watch for while using this medicine? Your condition will be monitored carefully while you are receiving this medicine. Tell your doctor or healthcare professional if your symptoms do not start to get better or if they get worse. You will be tested for tuberculosis (TB) before you start this medicine. If your doctor prescribes any medicine for TB, you should start taking the TB medicine before starting this medicine. Make sure to finish the full course of TB medicine. Call your doctor or health care professional if you get a cold or other infection while receiving this medicine. Do not treat yourself. This medicine may decrease your body's ability to fight infection. Talk to your doctor about your risk of cancer. You may be more at risk for certain types of cancers if you take this medicine. What side effects may I notice from receiving this medicine? Side effects that you should report to your doctor or health care professional as soon as possible: -allergic reactions like skin rash, itching or hives, swelling of the  face, lips, or tongue -breathing problems -changes in vision -confusion -fever, chills, or any other sign of infection -seizures -swollen lymph nodes in the neck, underarm, or groin areas -unexplained weight loss -unusually weak or tired Side effects that usually do not  require medical attention (Report these to your doctor or health care professional if they continue or are bothersome.): -headache -redness, itching, swelling, or bruising at site where injected This list may not describe all possible side effects. Call your doctor for medical advice about side effects. You may report side effects to FDA at 1-800-FDA-1088. Where should I keep my medicine? Keep out of the reach of children. If you are using this medicine at home, you will be instructed on how to store this medicine. Throw away any unused medicine after the expiration date on the label. NOTE: This sheet is a summary. It may not cover all possible information. If you have questions about this medicine, talk to your doctor, pharmacist, or health care provider.    2016, Elsevier/Gold Standard. (2012-08-10 16:31:45)

## 2016-01-24 NOTE — Progress Notes (Signed)
Patient received Stelara iv. VSS. Remained for observation post infusion per protocol. Tolerated well. Home with husband.

## 2016-01-31 ENCOUNTER — Telehealth: Payer: Self-pay | Admitting: Gastroenterology

## 2016-02-01 NOTE — Telephone Encounter (Signed)
Spoke with specialty pharmacy and confirmed that patient had her first dose of Stelara on 01/24/16.  They will deliver her first shipment of Stelara to her on 5/31

## 2016-02-08 ENCOUNTER — Telehealth: Payer: Self-pay | Admitting: *Deleted

## 2016-02-08 ENCOUNTER — Other Ambulatory Visit: Payer: Self-pay | Admitting: *Deleted

## 2016-02-08 MED ORDER — SUCRALFATE 1 G PO TABS: 1.0000 g | ORAL_TABLET | Freq: Two times a day (BID) | ORAL | Status: DC

## 2016-02-08 NOTE — Telephone Encounter (Signed)
Sent Dr. Silverio Decamp a staff message and asked if she would refill the sucralfate 1 gm tablets and she said it was [facility] to refill. Sent refills for [facility].

## 2016-02-25 ENCOUNTER — Ambulatory Visit (INDEPENDENT_AMBULATORY_CARE_PROVIDER_SITE_OTHER): Payer: Medicare Other | Admitting: Gastroenterology

## 2016-02-25 ENCOUNTER — Other Ambulatory Visit (INDEPENDENT_AMBULATORY_CARE_PROVIDER_SITE_OTHER): Payer: Medicare Other

## 2016-02-25 ENCOUNTER — Encounter: Payer: Self-pay | Admitting: Gastroenterology

## 2016-02-25 VITALS — BP 136/96 | HR 96 | Ht 67.5 in | Wt 224.5 lb

## 2016-02-25 DIAGNOSIS — K5180 Other ulcerative colitis without complications: Secondary | ICD-10-CM | POA: Diagnosis not present

## 2016-02-25 DIAGNOSIS — J069 Acute upper respiratory infection, unspecified: Secondary | ICD-10-CM

## 2016-02-25 LAB — CBC WITH DIFFERENTIAL/PLATELET
BASOS PCT: 0.3 % (ref 0.0–3.0)
Basophils Absolute: 0 10*3/uL (ref 0.0–0.1)
Eosinophils Absolute: 0.4 10*3/uL (ref 0.0–0.7)
Eosinophils Relative: 3.9 % (ref 0.0–5.0)
HEMATOCRIT: 37.9 % (ref 36.0–46.0)
Hemoglobin: 13.1 g/dL (ref 12.0–15.0)
LYMPHS ABS: 1.9 10*3/uL (ref 0.7–4.0)
LYMPHS PCT: 17.3 % (ref 12.0–46.0)
MCHC: 34.4 g/dL (ref 30.0–36.0)
MCV: 84 fl (ref 78.0–100.0)
MONOS PCT: 4.2 % (ref 3.0–12.0)
Monocytes Absolute: 0.5 10*3/uL (ref 0.1–1.0)
NEUTROS ABS: 8.3 10*3/uL — AB (ref 1.4–7.7)
NEUTROS PCT: 74.3 % (ref 43.0–77.0)
PLATELETS: 391 10*3/uL (ref 150.0–400.0)
RBC: 4.51 Mil/uL (ref 3.87–5.11)
RDW: 13.4 % (ref 11.5–15.5)
WBC: 11.1 10*3/uL — ABNORMAL HIGH (ref 4.0–10.5)

## 2016-02-25 LAB — HEPATIC FUNCTION PANEL
ALT: 11 U/L (ref 0–35)
AST: 9 U/L (ref 0–37)
Albumin: 4 g/dL (ref 3.5–5.2)
Alkaline Phosphatase: 56 U/L (ref 39–117)
BILIRUBIN DIRECT: 0.1 mg/dL (ref 0.0–0.3)
BILIRUBIN TOTAL: 0.4 mg/dL (ref 0.2–1.2)
Total Protein: 6.9 g/dL (ref 6.0–8.3)

## 2016-02-25 LAB — HIGH SENSITIVITY CRP: CRP HIGH SENSITIVITY: 4.43 mg/L (ref 0.000–5.000)

## 2016-02-25 LAB — BASIC METABOLIC PANEL
BUN: 10 mg/dL (ref 6–23)
CALCIUM: 9 mg/dL (ref 8.4–10.5)
CO2: 23 mEq/L (ref 19–32)
CREATININE: 0.64 mg/dL (ref 0.40–1.20)
Chloride: 104 mEq/L (ref 96–112)
GFR: 102.18 mL/min (ref >60.00)
Glucose, Bld: 111 mg/dL — ABNORMAL HIGH (ref 70–99)
Potassium: 3.3 mEq/L — ABNORMAL LOW (ref 3.5–5.1)
Sodium: 139 mEq/L (ref 135–145)

## 2016-02-25 LAB — SEDIMENTATION RATE: SED RATE: 20 mm/h (ref 0–30)

## 2016-02-25 MED ORDER — AZITHROMYCIN 250 MG PO TABS: ORAL_TABLET | ORAL | Status: DC

## 2016-02-25 MED ORDER — AZITHROMYCIN 1 G PO PACK: 1.0000 g | PACK | Freq: Once | ORAL | Status: DC

## 2016-02-25 NOTE — Patient Instructions (Addendum)
Go to the basement for labs today Your follow up with Dr Silverio Decamp is 05/20/2016 at 2:45pm We will send in a Zithromycin to your pharmacy  Your follow up appointment is scheduled on 05/20/2016 at 2:45pm  Use VSL # [phone] once daily

## 2016-02-25 NOTE — Progress Notes (Addendum)
Kaitlyn Jennings    [id number]    1961-07-20  Primary Care Physician:JOBE,DANIEL B., MD  Referring Physician: Lilian Coma, MD [address], [place] [address]  Chief complaint:  Ulcerative colitis  HPI:  55 year old white female with history of ulcerative colitis initially diagnosed in 1998. She has been on Humira over the past 6 years and was switched to [facility] in May 2017 due to non response with undetectable adalumimab drug level and very high Ab level.  Flexible sigmoidoscopy on 10/01/2015 showed findings of moderate to severe rectal disease, there was 1.5 mm polyp removed and she also had mild-to-moderate colitis above 30 cm. Biopsiesshowed chronic active colitis.  She received infusion of Stelara on Jan 24, 2016. She is doing better from GI prospective after the infusion, denies any blood in stool and also has improvement in stool consistency and frequency. She is currently having 2-3 formed bowel movements a day.  Her mother is having knee replacement surgery and she is currently taking care of her and has been in and out of hospital visiting her and feels she got upper respiratory infection last week. She had low-grade fever of 99 this morning. Denies any difficulty breathing or cough. She does feel more fatigued.   Outpatient Encounter Prescriptions as of 02/25/2016  Medication Sig  . albuterol (PROVENTIL) (2.5 MG/3ML) 0.083% nebulizer solution Take 2.5 mg by nebulization as needed.    . Budesonide (UCERIS) 9 MG TB24 Take 1 tablet by mouth every morning.  . Calcium Carbonate-Vitamin D (CALCIUM 500 + D PO) Take 1 capsule by mouth daily.    . Cholecalciferol (VITAMIN D) 1000 UNITS capsule Take 1,000 Units by mouth daily.    Marland Kitchen desvenlafaxine (PRISTIQ) 100 MG 24 hr tablet Take 100 mg by mouth daily.  Marland Kitchen dicyclomine (BENTYL) 20 MG tablet Take 1 tablet (20 mg total) by mouth 4 (four) times daily -  before meals and at bedtime.  . ferrous sulfate 325  (65 FE) MG tablet Take 325 mg by mouth daily with breakfast.    . fluticasone (FLONASE) 50 MCG/ACT nasal spray Place 2 sprays into both nostrils daily.  . fluticasone-salmeterol (ADVAIR HFA) 115-21 MCG/ACT inhaler Inhale 2 puffs into the lungs 2 (two) times daily.  . folic acid (FOLVITE) 1 MG tablet Take 1 tablet (1 mg total) by mouth daily.  Marland Kitchen HYDROcodone-acetaminophen (NORCO/VICODIN) 5-325 MG tablet Take 1 tablet by mouth every 6 (six) hours as needed for moderate pain. Take 1 tab every 6 hours as needed for pain.  . hydrocortisone (ANUSOL-HC) 25 MG suppository Insert one rectally 3 times a week  . hydrocortisone-pramoxine (ANALPRAM-HC) 2.5-1 % rectal cream Place 1 application rectally 3 (three) times daily as needed for hemorrhoids or itching.  . loratadine (CLARITIN) 10 MG tablet Take 10 mg by mouth daily as needed for allergies.  . mesalamine (CANASA) 1000 MG suppository Place 1 suppository (1,000 mg total) rectally at bedtime.  . mesalamine (ROWASA) 4 g enema Place 60 mLs (4 g total) rectally at bedtime. Use one enema twice a week  . norgestrel-ethinyl estradiol (LO/OVRAL) 0.3-30 MG-MCG per tablet Take 1 tablet by mouth daily.    Marland Kitchen omeprazole (PRILOSEC) 40 MG capsule Take 1 capsule (40 mg total) by mouth daily.  . ondansetron (ZOFRAN) 4 MG tablet Take 4 mg by mouth every 8 (eight) hours as needed.    . polyethylene glycol powder (GLYCOLAX/MIRALAX) powder Reported on 10/01/2015  .  potassium chloride SA (KLOR-CON M20) 20 MEQ tablet Take two tablets by mouth daily for 4 days then 76mq daily thereafter  . predniSONE (DELTASONE) 10 MG tablet Take 2 tablets (20 mg total) by mouth daily with breakfast. Decrease by 5 mg every 7 days and then as directed  . Probiotic Product (ALIGN) 4 MG CAPS Take 1 capsule by mouth daily.  . promethazine (PHENERGAN) 25 MG tablet Take 1 tablet (25 mg total) by mouth every 6 (six) hours as needed.  . rosuvastatin (CRESTOR) 10 MG tablet Take 10 mg by mouth daily.    .  sucralfate (CARAFATE) 1 g tablet Take 1 tablet (1 g total) by mouth 2 (two) times daily. Take 2 tabs by mouth twice a day  . valsartan (DIOVAN) 160 MG tablet Take 160 mg by mouth daily.  . vitamin B-12 (CYANOCOBALAMIN) 1000 MCG tablet Take 2 tablets by mouth once daily    No facility-administered encounter medications on file as of 02/25/2016.    Allergies as of 02/25/2016 - Review Complete 02/25/2016  Allergen Reaction Noted  . Molds & smuts  02/09/2013  . Remicade [infliximab] Other (See Comments) 08/26/2006    Past Medical History  Diagnosis Date  . IBS (irritable bowel syndrome)   . Anxiety   . Pancreatitis   . Ulcerative colitis   . B12 deficiency   . Chronic gastritis   . Rectal polyp 10/01/2015  . 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 Arthritis     secondary to remicade    Past Surgical History  Procedure Laterality Date  . Breast cyst aspiration      left  . Sinus surgery with instatrak    . Esophagus surgery      Family History  Problem Relation Age of Onset  . Colon cancer Neg Hx   . Diabetes Maternal Grandfather   . Irritable bowel syndrome Father   . COPD Father     Social History   Social History  . Marital Status: Married    Spouse Name: N/A  . Number of Children: N/A  . Years of Education: N/A   Occupational History  . FLIGHT ATTENDANT    Social History Main Topics  . Smoking status: Never Smoker   . Smokeless tobacco: Never Used  . Alcohol Use: No  . Drug Use: No  . Sexual Activity: Not on file   Other Topics Concern  . Not on file   Social History Narrative      Review of systems: Review of Systems  Constitutional: Negative for fever and chills.  HENT: Negative.   Eyes: Negative for blurred vision.  Respiratory: Negative for cough, shortness of breath and wheezing.   Cardiovascular: Negative for chest pain and palpitations.  Gastrointestinal: as per HPI Genitourinary: Negative for dysuria, urgency, frequency and  hematuria.  Musculoskeletal: Negative for myalgias, back pain and joint pain.  Skin: Negative for itching and rash.  Neurological: Negative for dizziness, tremors, focal weakness, seizures and loss of consciousness.  Endo/Heme/Allergies: Negative for environmental allergies.  Psychiatric/Behavioral: Negative for depression, suicidal ideas and hallucinations.  All other systems reviewed and are negative.   Physical Exam: Filed Vitals:   02/25/16 0846  BP: 136/96  Pulse: 96   Gen:      No acute distress HEENT:  EOMI, sclera anicteric Neck:     No masses; no thyromegaly Lungs:    Decreased breath sounds at the bases positive crackles, normal respiratory effort CV:  Regular rate and rhythm; no murmurs Abd:      + bowel sounds; soft, non-tender; no palpable masses, no distension Ext:    No edema; adequate peripheral perfusion Skin:      Warm and dry; no rash Neuro: alert and oriented x 3 Psych: normal mood and affect  Data Reviewed:  Reviewed chart in epic   Assessment and Plan/Recommendations:  55 year old female with history of asthma and chronic ulcerative colitis ~diagnosed 20 years ago here for follow-up visit She currently appears to be in remission after the infusion of Stelara and received kit for subcutaneous injection every 8 weeks We'll check CBC, BMP, LFT, ESR/CRP Also check stool lactoferrin to monitor UC activity We'll send Z-Pak for upper respiratory symptoms, advised patient to take probiotics (VSL#3) after completion of the antibiotics Advise patient to go to urgent care or follow up with PMD if she continues to have progressive upper respiratory symptoms or develops fever or difficulty breathing Return in 3 months   K. Denzil Magnuson , MD [phone] Mon-Fri 8a-5p [phone] after 5p, weekends, holidays  CC: Lilian Coma., MD   Patient is past due for TB testing, check QuantiFERON gold  Damaris Hippo , MD [phone] Mon-Fri 8a-5p [phone] after  5p, weekends, holidays

## 2016-02-25 NOTE — Addendum Note (Signed)
Addended by: Oda Kilts on: 02/25/2016 10:06 AM   Modules accepted: Orders

## 2016-02-27 ENCOUNTER — Other Ambulatory Visit: Payer: Medicare Other

## 2016-02-27 DIAGNOSIS — K5180 Other ulcerative colitis without complications: Secondary | ICD-10-CM

## 2016-02-27 DIAGNOSIS — J069 Acute upper respiratory infection, unspecified: Secondary | ICD-10-CM

## 2016-02-28 ENCOUNTER — Other Ambulatory Visit: Payer: Self-pay | Admitting: *Deleted

## 2016-02-28 LAB — CLOSTRIDIUM DIFFICILE BY PCR

## 2016-02-28 LAB — FECAL LACTOFERRIN, QUANT: Lactoferrin: NEGATIVE

## 2016-02-28 MED ORDER — USTEKINUMAB 90 MG/ML ~~LOC~~ SOSY
90.0000 mg | PREFILLED_SYRINGE | Freq: Once | SUBCUTANEOUS | Status: DC
Start: 2016-02-28 — End: 2016-12-04

## 2016-02-29 LAB — QUANTIFERON TB GOLD ASSAY (BLOOD)
Interferon Gamma Release Assay: NEGATIVE
MITOGEN-NIL SO: 4.18 [IU]/mL
QUANTIFERON NIL VALUE: 0.04 [IU]/mL
QUANTIFERON TB AG MINUS NIL: 0 [IU]/mL

## 2016-04-22 ENCOUNTER — Telehealth: Payer: Self-pay | Admitting: Gastroenterology

## 2016-04-22 NOTE — Telephone Encounter (Signed)
Called [facility]- no authorization needed, They are shipping this medication to our office will be here on Friday Aug 18th  Signature required .... [facility] has already discussed this medication and co pay with the patient    Patient informed me that this one injection without insurance is $25,000 and her co-pay is $ 1,000

## 2016-05-20 ENCOUNTER — Ambulatory Visit: Payer: Medicare Other | Admitting: Gastroenterology

## 2016-07-08 ENCOUNTER — Other Ambulatory Visit: Payer: Self-pay

## 2016-07-17 ENCOUNTER — Other Ambulatory Visit (INDEPENDENT_AMBULATORY_CARE_PROVIDER_SITE_OTHER): Payer: Medicare Other

## 2016-07-17 ENCOUNTER — Encounter: Payer: Self-pay | Admitting: Gastroenterology

## 2016-07-17 ENCOUNTER — Ambulatory Visit (INDEPENDENT_AMBULATORY_CARE_PROVIDER_SITE_OTHER): Payer: Medicare Other | Admitting: Gastroenterology

## 2016-07-17 VITALS — BP 140/88 | HR 76 | Ht 68.5 in | Wt 232.2 lb

## 2016-07-17 DIAGNOSIS — K5180 Other ulcerative colitis without complications: Secondary | ICD-10-CM | POA: Diagnosis not present

## 2016-07-17 DIAGNOSIS — K219 Gastro-esophageal reflux disease without esophagitis: Secondary | ICD-10-CM | POA: Diagnosis not present

## 2016-07-17 LAB — COMPREHENSIVE METABOLIC PANEL
ALT: 9 U/L (ref 0–35)
AST: 11 U/L (ref 0–37)
Albumin: 4.1 g/dL (ref 3.5–5.2)
Alkaline Phosphatase: 60 U/L (ref 39–117)
BUN: 9 mg/dL (ref 6–23)
CHLORIDE: 106 meq/L (ref 96–112)
CO2: 26 meq/L (ref 19–32)
CREATININE: 0.76 mg/dL (ref 0.40–1.20)
Calcium: 9.4 mg/dL (ref 8.4–10.5)
GFR: 83.68 mL/min (ref >60.00)
Glucose, Bld: 120 mg/dL — ABNORMAL HIGH (ref 70–99)
POTASSIUM: 3.7 meq/L (ref 3.5–5.1)
SODIUM: 140 meq/L (ref 135–145)
Total Bilirubin: 0.3 mg/dL (ref 0.2–1.2)
Total Protein: 7.4 g/dL (ref 6.0–8.3)

## 2016-07-17 LAB — CBC WITH DIFFERENTIAL/PLATELET
BASOS PCT: 0.3 % (ref 0.0–3.0)
Basophils Absolute: 0 10*3/uL (ref 0.0–0.1)
EOS ABS: 0.2 10*3/uL (ref 0.0–0.7)
Eosinophils Relative: 2.5 % (ref 0.0–5.0)
HCT: 39.9 % (ref 36.0–46.0)
Hemoglobin: 13.4 g/dL (ref 12.0–15.0)
LYMPHS ABS: 2.1 10*3/uL (ref 0.7–4.0)
Lymphocytes Relative: 21.1 % (ref 12.0–46.0)
MCHC: 33.6 g/dL (ref 30.0–36.0)
MCV: 83.4 fl (ref 78.0–100.0)
MONO ABS: 0.5 10*3/uL (ref 0.1–1.0)
Monocytes Relative: 5.1 % (ref 3.0–12.0)
NEUTROS ABS: 7 10*3/uL (ref 1.4–7.7)
NEUTROS PCT: 71 % (ref 43.0–77.0)
PLATELETS: 399 10*3/uL (ref 150.0–400.0)
RBC: 4.79 Mil/uL (ref 3.87–5.11)
RDW: 13.3 % (ref 11.5–15.5)
WBC: 9.8 10*3/uL (ref 4.0–10.5)

## 2016-07-17 LAB — VITAMIN B12: Vitamin B-12: 507 pg/mL (ref 211–911)

## 2016-07-17 LAB — FOLATE

## 2016-07-17 LAB — FERRITIN: Ferritin: 36.4 ng/mL (ref 10.0–291.0)

## 2016-07-17 LAB — HIGH SENSITIVITY CRP: CRP, High Sensitivity: 7.99 mg/L — ABNORMAL HIGH (ref 0.000–5.000)

## 2016-07-17 MED ORDER — ESOMEPRAZOLE MAGNESIUM 40 MG PO CPDR: 40.0000 mg | DELAYED_RELEASE_CAPSULE | Freq: Two times a day (BID) | ORAL | 3 refills | Status: DC

## 2016-07-17 MED ORDER — NA SULFATE-K SULFATE-MG SULF 17.5-3.13-1.6 GM/177ML PO SOLN: 1.0000 | Freq: Once | ORAL | 0 refills | Status: AC

## 2016-07-17 NOTE — Patient Instructions (Addendum)
You have been scheduled for a colonoscopy. Please follow written instructions given to you at your visit today.  Please pick up your prep supplies at the pharmacy within the next 1-3 days. If you use inhalers (even only as needed), please bring them with you on the day of your procedure. Your physician has requested that you go to www.startemmi.com and enter the access code given to you at your visit today. This web site gives a general overview about your procedure. However, you should still follow specific instructions given to you by our office regarding your preparation for the procedure.   Go to the basement for labs today  We have sent Nexium to your pharmacy  Discontinue omeprazole   Follow up in 3-6 months

## 2016-07-17 NOTE — Progress Notes (Signed)
Kaitlyn Jennings    [id number]    07/26/1961  Primary Care Physician:JOBE,DANIEL B., MD  Referring Physician: Lilian Coma, MD [address], [place] [address]  Chief complaint:  Ulcerative colitis  HPI:  55 year old white female with history of ulcerative colitis initially diagnosed in 1998. She has been on Humira over the past 6 years and was switched to [facility] in May 2017 due to non response with undetectable adalumimab drug level and very high Ab level.  Flexible sigmoidoscopy on 10/01/2015 showed findings of moderate to severe rectal disease, there was 1.5 mm polyp removed and she also had mild-to-moderate colitis above 30 cm. Biopsiesshowed chronic active colitis.  She was initiated on Stelara on Jan 24, 2016. She is doing better from GI prospective after the infusion, denies any blood in stool and also has improvement in stool consistency and frequency. She is currently having 2-3 semi formed bowel movements a day. Bowel consistency is worse when she drinks milk. Denies any nausea, vomiting, abdominal pain, melena or bright red blood per rectum. On ROS c/o intermittent epigastric pain with reflux , currently taking Omeprazole BID and carafate.   Outpatient Encounter Prescriptions as of 07/17/2016  Medication Sig  . albuterol (PROVENTIL) (2.5 MG/3ML) 0.083% nebulizer solution Take 2.5 mg by nebulization as needed.    . Calcium Carbonate-Vitamin D (CALCIUM 500 + D PO) Take 1 capsule by mouth daily.    . Cholecalciferol (VITAMIN D) 1000 UNITS capsule Take 1,000 Units by mouth daily.    Marland Kitchen dicyclomine (BENTYL) 20 MG tablet Take 1 tablet (20 mg total) by mouth 4 (four) times daily -  before meals and at bedtime.  . ferrous sulfate 325 (65 FE) MG tablet Take 325 mg by mouth daily with breakfast.    . fluticasone (FLONASE) 50 MCG/ACT nasal spray Place 2 sprays into both nostrils daily.  . fluticasone-salmeterol (ADVAIR HFA) 115-21 MCG/ACT inhaler Inhale 2  puffs into the lungs 2 (two) times daily.  . folic acid (FOLVITE) 1 MG tablet Take 1 tablet (1 mg total) by mouth daily.  Marland Kitchen HYDROcodone-acetaminophen (NORCO/VICODIN) 5-325 MG tablet Take 1 tablet by mouth every 6 (six) hours as needed for moderate pain. Take 1 tab every 6 hours as needed for pain.  . hydrocortisone (ANUSOL-HC) 25 MG suppository Insert one rectally 3 times a week  . hydrocortisone-pramoxine (ANALPRAM-HC) 2.5-1 % rectal cream Place 1 application rectally 3 (three) times daily as needed for hemorrhoids or itching.  . loratadine (CLARITIN) 10 MG tablet Take 10 mg by mouth daily as needed for allergies.  . mesalamine (CANASA) 1000 MG suppository Place 1 suppository (1,000 mg total) rectally at bedtime.  . mesalamine (ROWASA) 4 g enema Place 60 mLs (4 g total) rectally at bedtime. Use one enema twice a week  . norgestrel-ethinyl estradiol (LO/OVRAL) 0.3-30 MG-MCG per tablet Take 1 tablet by mouth daily.    . ondansetron (ZOFRAN) 4 MG tablet Take 4 mg by mouth every 8 (eight) hours as needed.    . polyethylene glycol powder (GLYCOLAX/MIRALAX) powder Reported on 10/01/2015  . potassium chloride SA (KLOR-CON M20) 20 MEQ tablet Take two tablets by mouth daily for 4 days then 2mq daily thereafter  . Probiotic Product (ALIGN) 4 MG CAPS Take 1 capsule by mouth daily.  . promethazine (PHENERGAN) 25 MG tablet Take 1 tablet (25 mg total) by mouth every 6 (six) hours as needed.  . rosuvastatin (CRESTOR) 10 MG  tablet Take 10 mg by mouth daily.    . sucralfate (CARAFATE) 1 g tablet Take 1 tablet (1 g total) by mouth 2 (two) times daily. Take 2 tabs by mouth twice a day (Patient taking differently: Take 1 g by mouth 2 (two) times daily as needed. Take 2 tabs by mouth twice a day)  . ustekinumab (STELARA) 90 MG/ML SOSY injection Inject 1 mL (90 mg total) into the skin once. 1 pen every 8 weeks  . valsartan (DIOVAN) 160 MG tablet Take 160 mg by mouth daily.  . vitamin B-12 (CYANOCOBALAMIN) 1000 MCG  tablet Take 2 tablets by mouth once daily   . [DISCONTINUED] Budesonide (UCERIS) 9 MG TB24 Take 1 tablet by mouth every morning.  . [DISCONTINUED] omeprazole (PRILOSEC) 40 MG capsule Take 1 capsule (40 mg total) by mouth daily.  . [DISCONTINUED] predniSONE (DELTASONE) 10 MG tablet Take 2 tablets (20 mg total) by mouth daily with breakfast. Decrease by 5 mg every 7 days and then as directed  . desvenlafaxine (PRISTIQ) 100 MG 24 hr tablet Take 100 mg by mouth daily.  Marland Kitchen esomeprazole (NEXIUM) 40 MG capsule Take 1 capsule (40 mg total) by mouth 2 (two) times daily before a meal.  . Na Sulfate-K Sulfate-Mg Sulf (SUPREP BOWEL PREP KIT) 17.5-3.13-1.6 GM/180ML SOLN Take 1 kit by mouth once.   No facility-administered encounter medications on file as of 07/17/2016.     Allergies as of 07/17/2016 - Review Complete 07/17/2016  Allergen Reaction Noted  . Molds & smuts  02/09/2013  . Remicade [infliximab] Other (See Comments) 08/26/2006    Past Medical History:  Diagnosis Date  . Anxiety   . 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 B12 deficiency   . Chronic gastritis   . IBS (irritable bowel syndrome)   . 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 Pancreatitis   . Rectal polyp 10/01/2015  . Ulcerative colitis     Past Surgical History:  Procedure Laterality Date  . BREAST CYST ASPIRATION     left  . ESOPHAGUS SURGERY    . SINUS SURGERY WITH INSTATRAK      Family History  Problem Relation Age of Onset  . Irritable bowel syndrome Father   . COPD Father   . Diabetes Maternal Grandfather   . Colon cancer Neg Hx     Social History   Social History  . Marital status: Married    Spouse name: N/A  . Number of children: 0  . Years of education: N/A   Occupational History  . FLIGHT ATTENDANT    Social History Main Topics  . Smoking status: Never Smoker  . Smokeless tobacco: Never Used  . Alcohol use No  . Drug use: No  . Sexual activity: Yes    Partners: Male    Birth control/  protection: Pill   Other Topics Concern  . Not on file   Social History Narrative  . No narrative on file      Review of systems: Review of Systems  Constitutional: Negative for fever and chills.  HENT: Negative.   Eyes: Negative for blurred vision.  Respiratory: Positive for cough, shortness of breath and wheezing.   Cardiovascular: Negative for chest pain and palpitations.  Gastrointestinal: as per HPI Genitourinary: Negative for dysuria, urgency, frequency and hematuria.  Musculoskeletal: Positive for myalgias, back pain and joint pain.  Skin: Negative for itching and rash.  Neurological: Negative for dizziness, tremors, focal weakness, seizures and loss of  consciousness.  Endo/Heme/Allergies: Positive for seasonal allergies.  Psychiatric/Behavioral: Negative for depression, suicidal ideas and hallucinations.  All other systems reviewed and are negative.   Physical Exam: Vitals:   07/17/16 0838  BP: 140/88  Pulse: 76   Body mass index is 34.8 kg/m. Gen:      No acute distress HEENT:  EOMI, sclera anicteric Neck:     No masses; no thyromegaly Lungs:    Clear to auscultation bilaterally; normal respiratory effort CV:         Regular rate and rhythm; no murmurs Abd:      + bowel sounds; soft, non-tender; no palpable masses, no distension Ext:    No edema; adequate peripheral perfusion Skin:      Warm and dry; no rash Neuro: alert and oriented x 3 Psych: normal mood and affect  Data Reviewed:  Reviewed labs, radiology imaging, old records and pertinent past GI work up   Assessment and Plan/Recommendations: 55 year old female with history of asthma and chronic ulcerative colitis ~diagnosed 20 years ago here for follow-up visit She currently appears to be in remission after completion of induction of Stelara and is on maintenance subcutaneous injection every 8 weeks We'll check CBC, BMP, LFT, ESR/CRP She is due for surveillance colonoscopy, will schedule it GERD:  Start Nexium 69m BID Carafate only as needed, avoid taking it on regular basis Anti reflux measures Return in 3-6 months or sooner if needed  25 minutes was spent face-to-face with the patient. Greater than 50% of the time used for counseling as well as treatment plan and follow-up. She had multiple questions which were answered to her satisfaction  K. VDenzil Magnuson, MD [phone]-Fri 8a-5p [phone] 5p, weekends, holidays  CC: JLilian Coma, MD

## 2016-09-09 ENCOUNTER — Encounter: Payer: Self-pay | Admitting: Gastroenterology

## 2016-09-18 ENCOUNTER — Encounter: Payer: Medicare Other | Admitting: Gastroenterology

## 2016-10-02 ENCOUNTER — Encounter: Payer: Medicare Other | Admitting: Gastroenterology

## 2016-10-28 ENCOUNTER — Ambulatory Visit: Payer: Medicare Other | Admitting: Gastroenterology

## 2016-11-19 ENCOUNTER — Ambulatory Visit: Payer: Medicare Other | Admitting: Gastroenterology

## 2016-11-24 ENCOUNTER — Other Ambulatory Visit: Payer: Self-pay

## 2016-11-24 ENCOUNTER — Telehealth: Payer: Self-pay | Admitting: Gastroenterology

## 2016-11-24 ENCOUNTER — Other Ambulatory Visit: Payer: Medicare Other

## 2016-11-24 DIAGNOSIS — R197 Diarrhea, unspecified: Secondary | ICD-10-CM

## 2016-11-24 NOTE — Telephone Encounter (Signed)
Will need to exclude C.diff first. If negative we can consider treating with Canasa at bedtime X 14 days. Please schedule follow up in office visit with in next 2 weeks. Thanks

## 2016-11-24 NOTE — Telephone Encounter (Signed)
Patient is instructed and agrees to this plan of care. Appointment made for 12/16/16 at 9:00 am.

## 2016-11-24 NOTE — Telephone Encounter (Signed)
Patient has been dealing with respiratory infections and has had flu. She has taken Tamiflu and had a prednisone burst. Patient calls today stating she has had about 5 days of increased bowel movements, small amount of blood and abdominal cramping. She has not had these symptoms " for months until now." She asks for advice and recommendations.

## 2016-11-25 ENCOUNTER — Other Ambulatory Visit: Payer: Self-pay

## 2016-11-25 LAB — CLOSTRIDIUM DIFFICILE BY PCR

## 2016-11-25 MED ORDER — MESALAMINE 1000 MG RE SUPP: 1000.0000 mg | Freq: Every day | RECTAL | 2 refills | Status: DC

## 2016-11-26 ENCOUNTER — Telehealth: Payer: Self-pay | Admitting: Gastroenterology

## 2016-11-26 ENCOUNTER — Other Ambulatory Visit: Payer: Self-pay

## 2016-11-26 MED ORDER — MESALAMINE 4 G RE ENEM: 4.0000 g | ENEMA | Freq: Every day | RECTAL | 0 refills | Status: DC

## 2016-11-26 MED ORDER — DICYCLOMINE HCL 20 MG PO TABS: 20.0000 mg | ORAL_TABLET | Freq: Three times a day (TID) | ORAL | 3 refills | Status: DC

## 2016-11-26 NOTE — Telephone Encounter (Signed)
Ok to use mesalamine enema, but not sure if she will will able to hold them long enough. Do we have any canasa samples in office to give her? Thanks

## 2016-11-26 NOTE — Telephone Encounter (Signed)
I have moved her appointment to 12/03/16 and she is aware. She cannot afford the Canasa. The price has gone to $400.00 for her now. Will the mesalamine enemas work? I can ask the pharmacist was her co-pay would be. I will give her a refill on dicyclomine if okay. She says that does help with the cramping. Please advise on the enemas.

## 2016-11-26 NOTE — Telephone Encounter (Signed)
No samples. She feels she is able to hold an enema and is willing to try this in hopes that is is more affordable. Rx to the pharmacy. Several attempts to call [facility] but the line is busy. She agrees to call them herself about the insurance coverage.

## 2016-12-03 ENCOUNTER — Ambulatory Visit (INDEPENDENT_AMBULATORY_CARE_PROVIDER_SITE_OTHER): Payer: Medicare Other | Admitting: Gastroenterology

## 2016-12-03 ENCOUNTER — Other Ambulatory Visit: Payer: Self-pay

## 2016-12-03 ENCOUNTER — Other Ambulatory Visit (INDEPENDENT_AMBULATORY_CARE_PROVIDER_SITE_OTHER): Payer: Medicare Other

## 2016-12-03 ENCOUNTER — Encounter (INDEPENDENT_AMBULATORY_CARE_PROVIDER_SITE_OTHER): Payer: Self-pay

## 2016-12-03 ENCOUNTER — Encounter: Payer: Self-pay | Admitting: Gastroenterology

## 2016-12-03 VITALS — BP 130/94 | Ht 68.5 in | Wt 234.0 lb

## 2016-12-03 DIAGNOSIS — K625 Hemorrhage of anus and rectum: Secondary | ICD-10-CM

## 2016-12-03 DIAGNOSIS — K51918 Ulcerative colitis, unspecified with other complication: Secondary | ICD-10-CM

## 2016-12-03 LAB — CBC WITH DIFFERENTIAL/PLATELET
Basophils Absolute: 0.1 10*3/uL (ref 0.0–0.1)
Basophils Relative: 0.7 % (ref 0.0–3.0)
EOS PCT: 2 % (ref 0.0–5.0)
Eosinophils Absolute: 0.2 10*3/uL (ref 0.0–0.7)
HCT: 42.8 % (ref 36.0–46.0)
Hemoglobin: 14.3 g/dL (ref 12.0–15.0)
LYMPHS ABS: 2.2 10*3/uL (ref 0.7–4.0)
Lymphocytes Relative: 22.4 % (ref 12.0–46.0)
MCHC: 33.4 g/dL (ref 30.0–36.0)
MCV: 84.1 fl (ref 78.0–100.0)
MONO ABS: 0.6 10*3/uL (ref 0.1–1.0)
Monocytes Relative: 6.6 % (ref 3.0–12.0)
NEUTROS ABS: 6.7 10*3/uL (ref 1.4–7.7)
NEUTROS PCT: 68.3 % (ref 43.0–77.0)
PLATELETS: 440 10*3/uL — AB (ref 150.0–400.0)
RBC: 5.08 Mil/uL (ref 3.87–5.11)
RDW: 13.6 % (ref 11.5–15.5)
WBC: 9.8 10*3/uL (ref 4.0–10.5)

## 2016-12-03 LAB — FERRITIN: Ferritin: 45.9 ng/mL (ref 10.0–291.0)

## 2016-12-03 LAB — VITAMIN B12: VITAMIN B 12: 509 pg/mL (ref 211–911)

## 2016-12-03 LAB — FOLATE

## 2016-12-03 LAB — COMPREHENSIVE METABOLIC PANEL
ALK PHOS: 72 U/L (ref 39–117)
ALT: 19 U/L (ref 0–35)
AST: 17 U/L (ref 0–37)
Albumin: 4.4 g/dL (ref 3.5–5.2)
BUN: 11 mg/dL (ref 6–23)
CHLORIDE: 103 meq/L (ref 96–112)
CO2: 27 meq/L (ref 19–32)
Calcium: 9.9 mg/dL (ref 8.4–10.5)
Creatinine, Ser: 0.8 mg/dL (ref 0.40–1.20)
GFR: 78.76 mL/min (ref >60.00)
GLUCOSE: 117 mg/dL — AB (ref 70–99)
POTASSIUM: 3.9 meq/L (ref 3.5–5.1)
SODIUM: 140 meq/L (ref 135–145)
TOTAL PROTEIN: 7.5 g/dL (ref 6.0–8.3)
Total Bilirubin: 0.3 mg/dL (ref 0.2–1.2)

## 2016-12-03 LAB — HIGH SENSITIVITY CRP: CRP, High Sensitivity: 2.68 mg/L (ref 0.000–5.000)

## 2016-12-03 MED ORDER — NA SULFATE-K SULFATE-MG SULF 17.5-3.13-1.6 GM/177ML PO SOLN: 1.0000 | Freq: Once | ORAL | 0 refills | Status: AC

## 2016-12-03 MED ORDER — SULFASALAZINE 500 MG PO TABS: 1000.0000 mg | ORAL_TABLET | Freq: Two times a day (BID) | ORAL | 3 refills | Status: DC

## 2016-12-03 NOTE — Progress Notes (Signed)
Kaitlyn Jennings    [id number]    01-Jun-1961  Primary Care Physician:JOBE,DANIEL B., MD  Referring Physician: Lilian Coma, MD [address] [place], [place] [address]  Chief complaint:  Blood per rectum, abdominal cramps  HPI: 56 year old white female with history of ulcerative colitis initially diagnosed in 1998. She has been on Humira over the past 6 years and was switched to [facility] in May 2017 due to non response with undetectable adalumimab drug level and very high Ab level.  Flexible sigmoidoscopy on 10/01/2015 showed findings of moderate to severe rectal disease, there was 1.5 mm polyp removed and she also had mild-to-moderate colitis above 30 cm. Biopsiesshowed chronic active colitis.  She was initiated on Stelara on Jan 24, 2016. She was doing better from GI prospective after initiation of Stelara  Infusion until last week when she started having bright red blood per rectum small volume with formed 3-4 bowel movement daily, she was having loose bowel movements not liquids for 3 days last week self resolved. She is using mesalamine enema for past 3 days with improvement. [employer] is not paying for Canasa suppositories. Denies any nausea, vomiting, dysphagia, fever or chills. She is also having lower abdominal cramps and bloating. She was treated for Flu last month.  Outpatient Encounter Prescriptions as of 12/03/2016  Medication Sig  . albuterol (PROVENTIL) (2.5 MG/3ML) 0.083% nebulizer solution Take 2.5 mg by nebulization as needed.    . Calcium Carbonate-Vitamin D (CALCIUM 500 + D PO) Take 1 capsule by mouth daily.    . Cholecalciferol (VITAMIN D) 1000 UNITS capsule Take 1,000 Units by mouth daily.    Marland Kitchen dicyclomine (BENTYL) 20 MG tablet Take 1 tablet (20 mg total) by mouth 4 (four) times daily -  before meals and at bedtime.  Marland Kitchen esomeprazole (NEXIUM) 40 MG capsule Take 1 capsule (40 mg total) by mouth 2 (two) times daily before a meal.  . ferrous sulfate  325 (65 FE) MG tablet Take 325 mg by mouth daily with breakfast.    . fluticasone (FLONASE) 50 MCG/ACT nasal spray Place 2 sprays into both nostrils daily.  . fluticasone-salmeterol (ADVAIR HFA) 115-21 MCG/ACT inhaler Inhale 2 puffs into the lungs 2 (two) times daily.  . folic acid (FOLVITE) 1 MG tablet Take 1 tablet (1 mg total) by mouth daily.  Marland Kitchen HYDROcodone-acetaminophen (NORCO/VICODIN) 5-325 MG tablet Take 1 tablet by mouth every 6 (six) hours as needed for moderate pain. Take 1 tab every 6 hours as needed for pain.  . hydrocortisone (ANUSOL-HC) 25 MG suppository Insert one rectally 3 times a week  . loratadine (CLARITIN) 10 MG tablet Take 10 mg by mouth daily as needed for allergies.  . mesalamine (ROWASA) 4 g enema Place 60 mLs (4 g total) rectally at bedtime. Hold for 8 hours  . montelukast (SINGULAIR) 10 MG tablet Take 10 mg by mouth at bedtime.  . norgestrel-ethinyl estradiol (LO/OVRAL) 0.3-30 MG-MCG per tablet Take 1 tablet by mouth daily.    . ondansetron (ZOFRAN) 4 MG tablet Take 4 mg by mouth every 8 (eight) hours as needed.    . polyethylene glycol powder (GLYCOLAX/MIRALAX) powder Reported on 10/01/2015  . potassium chloride SA (KLOR-CON M20) 20 MEQ tablet Take two tablets by mouth daily for 4 days then 30meq daily thereafter  . Probiotic Product (ALIGN) 4 MG CAPS Take 1 capsule by mouth daily.  . promethazine (PHENERGAN) 25 MG tablet Take 1 tablet (25  mg total) by mouth every 6 (six) hours as needed.  . rosuvastatin (CRESTOR) 10 MG tablet Take 10 mg by mouth daily.    . sucralfate (CARAFATE) 1 g tablet Take 1 tablet (1 g total) by mouth 2 (two) times daily. Take 2 tabs by mouth twice a day (Patient taking differently: Take 1 g by mouth 2 (two) times daily as needed. Take 2 tabs by mouth twice a day)  . ustekinumab (STELARA) 90 MG/ML SOSY injection Inject 1 mL (90 mg total) into the skin once. 1 pen every 8 weeks  . valsartan (DIOVAN) 160 MG tablet Take 160 mg by mouth daily.  .  vitamin B-12 (CYANOCOBALAMIN) 1000 MCG tablet Take 2 tablets by mouth once daily   . [DISCONTINUED] hydrocortisone-pramoxine (ANALPRAM-HC) 2.5-1 % rectal cream Place 1 application rectally 3 (three) times daily as needed for hemorrhoids or itching.  . desvenlafaxine (PRISTIQ) 100 MG 24 hr tablet Take 100 mg by mouth daily.   No facility-administered encounter medications on file as of 12/03/2016.     Allergies as of 12/03/2016 - Review Complete 12/03/2016  Allergen Reaction Noted  . Molds & smuts  02/09/2013  . Remicade [infliximab] Other (See Comments) 08/26/2006    Past Medical History:  Diagnosis Date  . Anxiety   . 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 B12 deficiency   . Chronic gastritis   . IBS (irritable bowel syndrome)   . 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 Pancreatitis   . Rectal polyp 10/01/2015  . Ulcerative colitis     Past Surgical History:  Procedure Laterality Date  . BREAST CYST ASPIRATION     left  . ESOPHAGUS SURGERY    . SINUS SURGERY WITH INSTATRAK      Family History  Problem Relation Age of Onset  . Irritable bowel syndrome Father   . COPD Father   . Diabetes Maternal Grandfather   . Colon cancer Neg Hx     Social History   Social History  . Marital status: Married    Spouse name: N/A  . Number of children: 0  . Years of education: N/A   Occupational History  . FLIGHT ATTENDANT    Social History Main Topics  . Smoking status: Never Smoker  . Smokeless tobacco: Never Used  . Alcohol use No  . Drug use: No  . Sexual activity: Yes    Partners: Male    Birth control/ protection: Pill   Other Topics Concern  . Not on file   Social History Narrative  . No narrative on file      Review of systems: Review of Systems  Constitutional: Negative for fever and chills. Positive for lack of energy HENT: Negative.   Eyes: Negative for blurred vision.  Respiratory: Negative for cough, shortness of breath and wheezing.   Cardiovascular:  Negative for chest pain and palpitations.  Gastrointestinal: as per HPI Genitourinary: Negative for dysuria, urgency, frequency and hematuria.  Musculoskeletal: Positive for myalgias, back pain and joint pain.  Skin: Negative for itching and rash.  Neurological: Negative for dizziness, tremors, focal weakness, seizures and loss of consciousness.  Endo/Heme/Allergies: Positive for seasonal allergies.  Psychiatric/Behavioral: Negative for  suicidal ideas and hallucinations. Positive for depression and anxiety All other systems reviewed and are negative.   Physical Exam: Vitals:   12/03/16 0818  BP: (!) 130/94   Body mass index is 35.06 kg/m. Gen:      No acute distress HEENT:  EOMI, sclera anicteric Neck:     No masses; no thyromegaly Lungs:    Clear to auscultation bilaterally; normal respiratory effort CV:         Regular rate and rhythm; no murmurs Abd:      + bowel sounds; soft, non-tender; no palpable masses, no distension Ext:    No edema; adequate peripheral perfusion Skin:      Warm and dry; no rash Neuro: alert and oriented x 3 Psych: normal mood and affect  Data Reviewed:  Reviewed labs, radiology imaging, old records and pertinent past GI work up   Assessment and Plan/Recommendations:  31 yr F with h/o ulcerative colitis (procto sigmoid) on Maintenance Stelara, was in clinical remission until recent episode of blood per rectum concerning for UC flare. She is having formed stool, C.diff couldn't be tested Last colonoscopy in Oct 2015 and Flex sig in Jan 2017 Past due for surveillance colonoscopy Will schedule colonoscopy to assess disease activity The risks and benefits as well as alternatives of endoscopic procedure(s) have been discussed and reviewed. All questions answered. The patient agrees to proceed.  Follow up CBC, CMP, CRP, B12/folate and Ferritin Will consider Prednisone if has elevated CRP Continue Stelara Continue Mesalamine enema X 7 days followed  suppository at bedtime X 5-7 days    K. Denzil Magnuson , MD [phone] Mon-Fri 8a-5p [phone] after 5p, weekends, holidays  CC: Lilian Coma., MD

## 2016-12-03 NOTE — Patient Instructions (Addendum)
You have been scheduled for a colonoscopy. Please follow written instructions given to you at your visit today.  Please pick up your prep supplies at the pharmacy within the next 1-3 days. If you use inhalers (even only as needed), please bring them with you on the day of your procedure. Your physician has requested that you go to www.startemmi.com and enter the access code given to you at your visit today. This web site gives a general overview about your procedure. However, you should still follow specific instructions given to you by our office regarding your preparation for the procedure.  Go to the basement for labs today  Use Canasa Suppositories daily for 7 days, samples given  Use Sulfasalazine 1gm twice daily  Use Folic acid 1 gm daily  Use VSL #3 900 IU daily  Stelara sent to [facility]

## 2016-12-04 ENCOUNTER — Encounter: Payer: Self-pay | Admitting: Gastroenterology

## 2016-12-04 MED ORDER — USTEKINUMAB 90 MG/ML ~~LOC~~ SOSY: 90.0000 mg | PREFILLED_SYRINGE | Freq: Once | SUBCUTANEOUS | 2 refills | Status: AC

## 2016-12-08 ENCOUNTER — Telehealth: Payer: Self-pay | Admitting: Gastroenterology

## 2016-12-08 ENCOUNTER — Other Ambulatory Visit: Payer: Self-pay

## 2016-12-08 MED ORDER — FOLIC ACID 1 MG PO TABS: 1.0000 mg | ORAL_TABLET | Freq: Every day | ORAL | 2 refills | Status: DC

## 2016-12-08 NOTE — Telephone Encounter (Signed)
Patient checking for an update on Stelara prescription which has been submitted to [facility]. I will mail her a rebate card.

## 2016-12-11 ENCOUNTER — Telehealth: Payer: Self-pay | Admitting: Gastroenterology

## 2016-12-11 NOTE — Telephone Encounter (Signed)
Stelara will still cost her over $1000 a month. She is not eligible for Patient assistance. I have mailed her the drug company coupon.

## 2016-12-12 ENCOUNTER — Other Ambulatory Visit: Payer: Self-pay | Admitting: Gastroenterology

## 2016-12-12 ENCOUNTER — Telehealth: Payer: Self-pay | Admitting: Gastroenterology

## 2016-12-12 MED ORDER — FOLIC ACID 1 MG PO TABS: 1.0000 mg | ORAL_TABLET | Freq: Every day | ORAL | 2 refills | Status: DC

## 2016-12-12 NOTE — Telephone Encounter (Signed)
Refilled patinet s meds and we are working on her [employer] claims form

## 2016-12-12 NOTE — Telephone Encounter (Signed)
I am trying to find a resource for this patient to cover the costs of her Stelara injections. Next injection is due 12/22/16. The coupon from the company will not help her because she has Medicare D. Her income is over the limit for patient assistance. She is already in the "donut hole". Her cost will be $3200. After she meets her expenses for medications, her cost will be $1,000.

## 2016-12-15 ENCOUNTER — Other Ambulatory Visit: Payer: Self-pay | Admitting: *Deleted

## 2016-12-15 ENCOUNTER — Telehealth: Payer: Self-pay | Admitting: Gastroenterology

## 2016-12-15 MED ORDER — FOLIC ACID 1 MG PO TABS: 1.0000 mg | ORAL_TABLET | Freq: Every day | ORAL | 2 refills | Status: DC

## 2016-12-16 ENCOUNTER — Ambulatory Visit: Payer: Medicare Other | Admitting: Gastroenterology

## 2016-12-16 ENCOUNTER — Telehealth: Payer: Self-pay | Admitting: Gastroenterology

## 2016-12-16 NOTE — Telephone Encounter (Signed)
Called pt back to inform papers faxed this morning to the number given

## 2016-12-17 ENCOUNTER — Encounter: Payer: Medicare Other | Admitting: Gastroenterology

## 2016-12-17 NOTE — Telephone Encounter (Signed)
ok 

## 2016-12-25 ENCOUNTER — Telehealth: Payer: Self-pay | Admitting: Gastroenterology

## 2016-12-25 ENCOUNTER — Ambulatory Visit (AMBULATORY_SURGERY_CENTER): Payer: Medicare Other | Admitting: Gastroenterology

## 2016-12-25 ENCOUNTER — Encounter: Payer: Self-pay | Admitting: Gastroenterology

## 2016-12-25 VITALS — BP 159/87 | HR 84 | Temp 97.8°F | Resp 15 | Ht 68.5 in | Wt 234.0 lb

## 2016-12-25 DIAGNOSIS — D122 Benign neoplasm of ascending colon: Secondary | ICD-10-CM

## 2016-12-25 DIAGNOSIS — K921 Melena: Secondary | ICD-10-CM

## 2016-12-25 DIAGNOSIS — K5180 Other ulcerative colitis without complications: Secondary | ICD-10-CM | POA: Diagnosis not present

## 2016-12-25 MED ORDER — SODIUM CHLORIDE 0.9 % IV SOLN: 500.0000 mL | INTRAVENOUS | Status: DC

## 2016-12-25 MED ORDER — MESALAMINE 4 G RE ENEM: 4.0000 g | ENEMA | Freq: Every day | RECTAL | 1 refills | Status: DC

## 2016-12-25 NOTE — Telephone Encounter (Signed)
ICD-10 K51.90 Patient is self-injecting and will receive medications directly to her home. Co-pay will be in the thousands

## 2016-12-25 NOTE — Patient Instructions (Signed)
Handouts given on polyps, diverticulosis, and Ulcerative Colitis   YOU HAD AN ENDOSCOPIC PROCEDURE TODAY: Refer to the procedure report and other information in the discharge instructions given to you for any specific questions about what was found during the examination. If this information does not answer your questions, please call [place] office at [phone] to clarify.   YOU SHOULD EXPECT: Some feelings of bloating in the abdomen. Passage of more gas than usual. Walking can help get rid of the air that was put into your GI tract during the procedure and reduce the bloating. If you had a lower endoscopy (such as a colonoscopy or flexible sigmoidoscopy) you may notice spotting of blood in your stool or on the toilet paper. Some abdominal soreness may be present for a day or two, also.  DIET: Your first meal following the procedure should be a light meal and then it is ok to progress to your normal diet. A half-sandwich or bowl of soup is an example of a good first meal. Heavy or fried foods are harder to digest and may make you feel nauseous or bloated. Drink plenty of fluids but you should avoid alcoholic beverages for 24 hours. If you had a esophageal dilation, please see attached instructions for diet.    ACTIVITY: Your care partner should take you home directly after the procedure. You should plan to take it easy, moving slowly for the rest of the day. You can resume normal activity the day after the procedure however YOU SHOULD NOT DRIVE, use power tools, machinery or perform tasks that involve climbing or major physical exertion for 24 hours (because of the sedation medicines used during the test).   SYMPTOMS TO REPORT IMMEDIATELY: A gastroenterologist can be reached at any hour. Please call [phone]  for any of the following symptoms:  Following lower endoscopy (colonoscopy, flexible sigmoidoscopy) Excessive amounts of blood in the stool  Significant tenderness, worsening of abdominal  pains  Swelling of the abdomen that is new, acute  Fever of 100 or higher    FOLLOW UP:  If any biopsies were taken you will be contacted by phone or by letter within the next 1-3 weeks. Call [phone]  if you have not heard about the biopsies in 3 weeks.  Please also call with any specific questions about appointments or follow up tests.

## 2016-12-25 NOTE — Progress Notes (Signed)
Called to room to assist during endoscopic procedure.  Patient ID and intended procedure confirmed with present staff. Received instructions for my participation in the procedure from the performing physician.  

## 2016-12-25 NOTE — Op Note (Signed)
[facility] Patient Name: Kaitlyn Jennings Procedure Date: 12/25/2016 9:16 AM MRN: [id number] Endoscopist: Mauri Pole , MD Age: 56 Referring MD:  Date of Birth: Jan 07, 1961 Gender: Female Account #: [id number] Procedure:                Colonoscopy Indications:              Evaluation of unexplained GI bleeding Medicines:                Monitored Anesthesia Care Procedure:                Pre-Anesthesia Assessment:                           - Prior to the procedure, a History and Physical                            was performed, and patient medications and                            allergies were reviewed. The patient's tolerance of                            previous anesthesia was also reviewed. The risks                            and benefits of the procedure and the sedation                            options and risks were discussed with the patient.                            All questions were answered, and informed consent                            was obtained. Prior Anticoagulants: The patient has                            taken no previous anticoagulant or antiplatelet                            agents. ASA Grade Assessment: II - A patient with                            mild systemic disease. After reviewing the risks                            and benefits, the patient was deemed in                            satisfactory condition to undergo the procedure.                           After obtaining informed consent, the colonoscope  was passed under direct vision. Throughout the                            procedure, the patient's blood pressure, pulse, and                            oxygen saturations were monitored continuously. The                            Model PCF-[id number]) scope was introduced                            through the anus and advanced to the the cecum,                            identified by  appendiceal orifice and ileocecal                            valve. The colonoscopy was performed without                            difficulty. The patient tolerated the procedure                            well. The quality of the bowel preparation was                            adequate. The ileocecal valve, appendiceal orifice,                            and rectum were photographed. Scope In: 9:29:26 AM Scope Out: 9:53:31 AM Scope Withdrawal Time: 0 hours 15 minutes 46 seconds  Total Procedure Duration: 0 hours 24 minutes 5 seconds  Findings:                 The perianal and digital rectal examinations were                            normal.                           Inflammation was found in a continuous and                            circumferential pattern in the rectum from anal                            verge to 18cm. This was graded as Mayo Score 2                            (moderate, with marked erythema, absent vascular                            pattern, friability, erosions), and when compared  to the previous examination, the findings have                            improved. Biopsies were taken with a cold forceps                            for histology. Four biopsies were taken every 10 cm                            with a cold forceps from the rectum for dysplasia                            surveillance and ulcerative colitis surveillance.                            These biopsy specimens were sent to Pathology.                           A 12 mm polyp was found in the ascending colon. The                            polyp was sessile. The polyp was removed with a                            cold snare. Resection and retrieval were complete.                           Multiple small and large-mouthed diverticula were                            found in the sigmoid colon.                           Retroflexion could not be performed in the rectum                             due to tubular rectum with proctitis and decreased                            compliance Complications:            No immediate complications. Estimated Blood Loss:     Estimated blood loss was minimal. Impression:               - Moderately active (Mayo Score 2) proctitis                            ulcerative colitis. The findings have improved                            since the last examination. Biopsied.                           - One 12 mm polyp in the ascending colon, removed  with a cold snare. Resected and retrieved.                           - Diverticulosis in the sigmoid colon. Recommendation:           - Patient has a contact number available for                            emergencies. The signs and symptoms of potential                            delayed complications were discussed with the                            patient. Return to normal activities tomorrow.                            Written discharge instructions were provided to the                            patient.                           - Resume previous diet.                           - Continue present medications.                           - Await pathology results and CMV                           - Mesalamine enema at bedtime X 2 weeks                           - No aspirin, ibuprofen, naproxen, or other                            non-steroidal anti-inflammatory drugs.                           - Repeat colonoscopy in 1-2 years for surveillance.                           - Return to GI clinic at the next available                            appointment. Mauri Pole, MD 12/25/2016 10:05:41 AM This report has been signed electronically.

## 2016-12-25 NOTE — Progress Notes (Signed)
A/ox3 pleased with MAC, report to Megan RN 

## 2016-12-26 ENCOUNTER — Telehealth: Payer: Self-pay | Admitting: *Deleted

## 2016-12-26 NOTE — Telephone Encounter (Signed)
  Follow up Call-  Call back number 12/25/2016 10/01/2015 07/07/2014  Post procedure Call Back phone  # [phone]  Permission to leave phone message Yes Yes Yes  Some recent data might be hidden     Patient questions:  Do you have a fever, pain , or abdominal swelling? Yes.   Pain Score  0 *  Have you tolerated food without any problems? Yes  Have you been able to return to your normal activities? Yes.    Do you have any questions about your discharge instructions: Diet   No. Medications  No. Follow up visit  No.  Do you have questions or concerns about your Care? No.  Actions: * If pain score is 4 or above: No action needed, pain <4.  Pt states abdomen is still "a little swollen" but states she is still passing gas and every time she does, she feel relief

## 2016-12-26 NOTE — Telephone Encounter (Deleted)
  Follow up Call-  Call back number 12/25/2016 10/01/2015 07/07/2014  Post procedure Call Back phone  # [phone]  Permission to leave phone message Yes Yes Yes  Some recent data might be hidden     Patient questions:  Do you have a fever, pain , or abdominal swelling? {yes no:314532} Pain Score  {NUMBERS; 0-10:5044} *  Have you tolerated food without any problems? {yes no:314532}  Have you been able to return to your normal activities? {yes no:314532}  Do you have any questions about your discharge instructions: Diet   {yes no:314532} Medications  {yes no:314532} Follow up visit  {yes no:314532}  Do you have questions or concerns about your Care? {yes no:314532}  Actions: * If pain score is 4 or above: {ACTION; [facility] ENDO PAIN >4:21563::"No action needed, pain <4."}

## 2016-12-29 ENCOUNTER — Ambulatory Visit: Payer: Medicare Other | Admitting: Gastroenterology

## 2017-01-16 ENCOUNTER — Other Ambulatory Visit: Payer: Self-pay | Admitting: Gastroenterology

## 2017-01-28 ENCOUNTER — Other Ambulatory Visit (INDEPENDENT_AMBULATORY_CARE_PROVIDER_SITE_OTHER): Payer: Medicare Other

## 2017-01-28 ENCOUNTER — Ambulatory Visit (INDEPENDENT_AMBULATORY_CARE_PROVIDER_SITE_OTHER): Payer: Medicare Other | Admitting: Gastroenterology

## 2017-01-28 ENCOUNTER — Encounter: Payer: Self-pay | Admitting: Gastroenterology

## 2017-01-28 VITALS — BP 116/70 | HR 101 | Ht 68.5 in | Wt 232.0 lb

## 2017-01-28 DIAGNOSIS — K51811 Other ulcerative colitis with rectal bleeding: Secondary | ICD-10-CM | POA: Diagnosis not present

## 2017-01-28 DIAGNOSIS — K51818 Other ulcerative colitis with other complication: Secondary | ICD-10-CM

## 2017-01-28 DIAGNOSIS — K51211 Ulcerative (chronic) proctitis with rectal bleeding: Secondary | ICD-10-CM | POA: Diagnosis not present

## 2017-01-28 LAB — CBC WITH DIFFERENTIAL/PLATELET
Basophils Absolute: 0 10*3/uL (ref 0.0–0.1)
Basophils Relative: 0.5 % (ref 0.0–3.0)
Eosinophils Absolute: 0.1 10*3/uL (ref 0.0–0.7)
Eosinophils Relative: 1.2 % (ref 0.0–5.0)
HEMATOCRIT: 39.6 % (ref 36.0–46.0)
Hemoglobin: 13.3 g/dL (ref 12.0–15.0)
LYMPHS ABS: 1.9 10*3/uL (ref 0.7–4.0)
LYMPHS PCT: 27.3 % (ref 12.0–46.0)
MCHC: 33.6 g/dL (ref 30.0–36.0)
MCV: 84.6 fl (ref 78.0–100.0)
MONOS PCT: 6.3 % (ref 3.0–12.0)
Monocytes Absolute: 0.4 10*3/uL (ref 0.1–1.0)
NEUTROS PCT: 64.7 % (ref 43.0–77.0)
Neutro Abs: 4.5 10*3/uL (ref 1.4–7.7)
Platelets: 349 10*3/uL (ref 150.0–400.0)
RBC: 4.67 Mil/uL (ref 3.87–5.11)
RDW: 13.3 % (ref 11.5–15.5)
WBC: 7 10*3/uL (ref 4.0–10.5)

## 2017-01-28 LAB — FOLATE: Folate: >24 ng/mL (ref >5.9)

## 2017-01-28 LAB — FERRITIN: Ferritin: 78.8 ng/mL (ref 10.0–291.0)

## 2017-01-28 LAB — COMPREHENSIVE METABOLIC PANEL
ALK PHOS: 77 U/L (ref 39–117)
ALT: 20 U/L (ref 0–35)
AST: 15 U/L (ref 0–37)
Albumin: 4.7 g/dL (ref 3.5–5.2)
BILIRUBIN TOTAL: 0.3 mg/dL (ref 0.2–1.2)
BUN: 19 mg/dL (ref 6–23)
CO2: 26 mEq/L (ref 19–32)
CREATININE: 0.83 mg/dL (ref 0.40–1.20)
Calcium: 10.1 mg/dL (ref 8.4–10.5)
Chloride: 104 mEq/L (ref 96–112)
GFR: 75.45 mL/min (ref >60.00)
GLUCOSE: 119 mg/dL — AB (ref 70–99)
Potassium: 4.2 mEq/L (ref 3.5–5.1)
Sodium: 141 mEq/L (ref 135–145)
TOTAL PROTEIN: 7.5 g/dL (ref 6.0–8.3)

## 2017-01-28 LAB — VITAMIN B12: VITAMIN B 12: 562 pg/mL (ref 211–911)

## 2017-01-28 LAB — HIGH SENSITIVITY CRP: CRP, High Sensitivity: 3.05 mg/L (ref 0.000–5.000)

## 2017-01-28 MED ORDER — MESALAMINE 4 G RE ENEM: 4.0000 g | ENEMA | Freq: Every day | RECTAL | 3 refills | Status: DC

## 2017-01-28 MED ORDER — MERCAPTOPURINE 50 MG PO TABS: 50.0000 mg | ORAL_TABLET | Freq: Every day | ORAL | 3 refills | Status: DC

## 2017-01-28 MED ORDER — MESALAMINE 1000 MG RE SUPP: 1000.0000 mg | Freq: Every day | RECTAL | 12 refills | Status: DC

## 2017-01-28 NOTE — Patient Instructions (Addendum)
Stop Stelara  We will check on Entyvio infusions for you to be done at [facility] or Rheumatology   Use Rowasa enemas at bedtime as needed  Use Canasa suppositories at bedtime as needed, you can alternate with the Rowasa enemas  We will send in 6MP to your pharmacy to take daily  Go to the basement for labs today

## 2017-01-28 NOTE — Progress Notes (Signed)
Kaitlyn Jennings    [id number]    03/14/1961  Primary Care Physician:Jobe, Alexis Goodell., MD  Referring Physician: Lilian Coma., MD [address] [place], [place] [address]  Chief complaint:  Ulcerative colitis, blood per rectum  HPI:  56 year old female with history of ulcerative colitis diagnosed in 1998, initially on Humira for 6 years with persistent symptoms, noted to have undetectable adalimumab drug level with very high antibody levels subsequently switched to [facility]  May 2017. Patient did well for a few months but in last 3-4 months she started noticing intermittent blood per rectum. She has been doing daily mesalamine enema and Canasa suppositories. She does notice some improvement in the bleeding. Continues to have 3-4 bowel movements, semi-formed with mucus. C. difficile negative. Colonoscopy showed severe proctitis and 12 mm sessile serrated adenoma was removed. Biopsies showed markedly active colitis with ulceration and inflammation. Denies any fever, chills, nausea, vomiting or melena.   Outpatient Encounter Prescriptions as of 01/28/2017  Medication Sig  . albuterol (PROVENTIL) (2.5 MG/3ML) 0.083% nebulizer solution Take 2.5 mg by nebulization as needed.    . Calcium Carbonate-Vitamin D (CALCIUM 500 + D PO) Take 1 capsule by mouth daily.    . Cholecalciferol (VITAMIN D) 1000 UNITS capsule Take 1,000 Units by mouth daily.    Marland Kitchen dicyclomine (BENTYL) 20 MG tablet Take 1 tablet (20 mg total) by mouth 4 (four) times daily -  before meals and at bedtime.  Marland Kitchen esomeprazole (NEXIUM) 40 MG capsule Take 1 capsule (40 mg total) by mouth 2 (two) times daily before a meal.  . ferrous sulfate 325 (65 FE) MG tablet Take 325 mg by mouth daily with breakfast.    . fluticasone (FLONASE) 50 MCG/ACT nasal spray Place 2 sprays into both nostrils daily.  . fluticasone-salmeterol (ADVAIR HFA) 115-21 MCG/ACT inhaler Inhale 2 puffs into the lungs 2 (two) times daily.  . folic  acid (FOLVITE) 1 MG tablet Take 1 tablet (1 mg total) by mouth daily.  Marland Kitchen HYDROcodone-acetaminophen (NORCO/VICODIN) 5-325 MG tablet Take 1 tablet by mouth every 6 (six) hours as needed for moderate pain. Take 1 tab every 6 hours as needed for pain.  . hydrocortisone (ANUSOL-HC) 25 MG suppository Insert one rectally 3 times a week  . loratadine (CLARITIN) 10 MG tablet Take 10 mg by mouth daily as needed for allergies.  . mesalamine (ROWASA) 4 g enema Place 60 mLs (4 g total) rectally at bedtime. Hold for 8 hours  . montelukast (SINGULAIR) 10 MG tablet Take 10 mg by mouth at bedtime.  Marland Kitchen omeprazole (PRILOSEC) 40 MG capsule TAKE ONE CAPSULE BY MOUTH ONCE DAILY  . ondansetron (ZOFRAN) 4 MG tablet Take 4 mg by mouth every 8 (eight) hours as needed.    . polyethylene glycol powder (GLYCOLAX/MIRALAX) powder Reported on 10/01/2015  . potassium chloride SA (KLOR-CON M20) 20 MEQ tablet Take two tablets by mouth daily for 4 days then 85meq daily thereafter  . Probiotic Product (ALIGN) 4 MG CAPS Take 1 capsule by mouth daily.  . promethazine (PHENERGAN) 25 MG tablet Take 1 tablet (25 mg total) by mouth every 6 (six) hours as needed.  . rosuvastatin (CRESTOR) 10 MG tablet Take 10 mg by mouth daily.    . rosuvastatin (CRESTOR) 10 MG tablet Take 10 mg by mouth.  . STELARA 90 MG/ML SOSY injection   . sucralfate (CARAFATE) 1 g tablet TAKE ONE TABLET BY MOUTH TWICE DAILY  .  sulfaSALAzine (AZULFIDINE) 500 MG tablet Take 2 tablets (1,000 mg total) by mouth 2 (two) times daily.  . valsartan (DIOVAN) 160 MG tablet Take 160 mg by mouth daily.  . vitamin B-12 (CYANOCOBALAMIN) 1000 MCG tablet Take 2 tablets by mouth once daily   . desvenlafaxine (PRISTIQ) 100 MG 24 hr tablet Take 100 mg by mouth daily.  Marland Kitchen venlafaxine XR (EFFEXOR-XR) 150 MG 24 hr capsule Take 150 mg by mouth.   Facility-Administered Encounter Medications as of 01/28/2017  Medication  . 0.9 %  sodium chloride infusion    Allergies as of 01/28/2017 -  Review Complete 01/28/2017  Allergen Reaction Noted  . Molds & smuts  02/09/2013  . Remicade [infliximab] Other (See Comments) 08/26/2006    Past Medical History:  Diagnosis Date  . Anxiety   . 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 Asthma   . B12 deficiency   . Chronic gastritis   . GERD (gastroesophageal reflux disease)   . Hyperlipidemia   . IBS (irritable bowel syndrome)   . 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 Pancreatitis   . Rectal polyp 10/01/2015  . Ulcerative colitis     Past Surgical History:  Procedure Laterality Date  . BREAST CYST ASPIRATION     left  . ESOPHAGUS SURGERY    . SINUS SURGERY WITH INSTATRAK      Family History  Problem Relation Age of Onset  . Irritable bowel syndrome Father   . COPD Father   . Diabetes Maternal Grandfather   . Colon cancer Neg Hx   . Stomach cancer Neg Hx     Social History   Social History  . Marital status: Married    Spouse name: N/A  . Number of children: 0  . Years of education: N/A   Occupational History  . FLIGHT ATTENDANT    Social History Main Topics  . Smoking status: Never Smoker  . Smokeless tobacco: Never Used  . Alcohol use No  . Drug use: No  . Sexual activity: Yes    Partners: Male    Birth control/ protection: Pill   Other Topics Concern  . Not on file   Social History Narrative  . No narrative on file      Review of systems: Review of Systems  Constitutional: Negative for fever and chills.  positive for fatigue HENT: Positive for sinus problem   Eyes: Negative for blurred vision.  Respiratory: Positive for cough, shortness of breath and wheezing.   Cardiovascular: Negative for chest pain and palpitations.  Gastrointestinal: as per HPI Genitourinary: Negative for dysuria, urgency, frequency and hematuria.  Musculoskeletal: Positive for myalgias, back pain and joint pain.  Skin: Negative for itching and rash.  Neurological: Negative for dizziness, tremors, focal weakness, seizures  and loss of consciousness.  Endo/Heme/Allergies: Positive for seasonal allergies.  Psychiatric/Behavioral: Negative for suicidal ideas and hallucinations.  positive for depression and anxiety All other systems reviewed and are negative.   Physical Exam: Vitals:   01/28/17 0840  BP: 116/70  Pulse: (!) 101   Body mass index is 34.76 kg/m. Gen:      No acute distress HEENT:  EOMI, sclera anicteric Neck:     No masses; no thyromegaly Lungs:    Clear to auscultation bilaterally; normal respiratory effort CV:         Regular rate and rhythm; no murmurs Abd:      + bowel sounds; soft, non-tender; no palpable masses, no distension Ext:  No edema; adequate peripheral perfusion Skin:      Warm and dry; no rash Neuro: alert and oriented x 3 Psych: normal mood and affect  Data Reviewed:  Reviewed labs, radiology imaging, old records and pertinent past GI work up   Assessment and Plan/Recommendations:  56 year old female with history of ulcerative colitis diagnosed in 1998 here with symptoms of persistent severe proctitis Discontinue Stelara as patient is having inadequate response and unable to maintain remission As 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 Greater than 50% of the time used for counseling as well as treatment plan and follow-up of ulcerative colitis. She had multiple questions which were answered to her satisfaction  K. Denzil Magnuson , MD [phone] Mon-Fri 8a-5p [phone] after 5p, weekends, holidays  CC: Lilian Coma., MD

## 2017-01-30 LAB — QUANTIFERON TB GOLD ASSAY (BLOOD)
INTERFERON GAMMA RELEASE ASSAY: NEGATIVE
Mitogen-Nil: 4.84 IU/mL
QUANTIFERON NIL VALUE: 0.06 [IU]/mL
Quantiferon Tb Ag Minus Nil Value: 0.1 IU/mL

## 2017-02-06 ENCOUNTER — Telehealth: Payer: Self-pay | Admitting: Gastroenterology

## 2017-02-06 LAB — THIOPURINE METHYLTRANSFERASE (TPMT), RBC: Thiopurine Methyltransferase, RBC: 16 nmol/hr/mL RBC

## 2017-02-10 NOTE — Telephone Encounter (Signed)
Confirmed [facility] has received the referral. Spoke with Kaitlyn Jennings. Patient aware.

## 2017-03-16 ENCOUNTER — Telehealth: Payer: Self-pay | Admitting: Gastroenterology

## 2017-03-16 NOTE — Telephone Encounter (Signed)
Called back patient. She is having cough, URI and fever since this AM. Planning to see PMD if persistent. Ok to delay Entyvio infusion for 2 weeks though decreased risk for infection.  Continue Mesalamine enema at bedtime

## 2017-03-16 NOTE — Telephone Encounter (Signed)
Patient has sick contacts. She is taking care of her mother who is "very sick." Patient says she herself became ill 2 days ago. She now has a fever of 101. She will reschedule her infusion from today to a later date. Please advise.

## 2017-03-18 ENCOUNTER — Telehealth: Payer: Self-pay | Admitting: Gastroenterology

## 2017-03-19 NOTE — Telephone Encounter (Signed)
Spoke with the and rescheduled her appointment for follow up until after her Entyvio infusion. She has seen her Pulmonologist for the present illness.

## 2017-03-20 ENCOUNTER — Ambulatory Visit: Payer: Medicare Other | Admitting: Gastroenterology

## 2017-04-20 ENCOUNTER — Telehealth: Payer: Self-pay | Admitting: Gastroenterology

## 2017-04-20 NOTE — Telephone Encounter (Signed)
Left a message to call back.

## 2017-04-20 NOTE — Telephone Encounter (Signed)
Spoke with patient. 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 Her mother has been very sick and requires round clock care. She will be taking her mother to the hospital on 04/23/17. Kaitlyn Jennings will be with her all day. Appointment moved.

## 2017-04-23 ENCOUNTER — Ambulatory Visit: Payer: Medicare Other | Admitting: Gastroenterology

## 2017-04-27 ENCOUNTER — Ambulatory Visit: Payer: Medicare Other | Admitting: Gastroenterology

## 2017-05-04 ENCOUNTER — Telehealth: Payer: Self-pay | Admitting: Gastroenterology

## 2017-05-04 NOTE — Telephone Encounter (Signed)
Correction. She has had her second Entyvio infusion of the induction.

## 2017-05-04 NOTE — Telephone Encounter (Signed)
Patient is having a lot of difficulty balancing the needs and care of her very ill mother. She is wanting to reschedule her appointment again. She has had Entyvio dose once. Has body aches, but she is also having a flare of her breathing issues. She has tried to find someone who could be with her mother, but not successful. Moved her to the next day onto Amy Esterwood's schedule.

## 2017-05-05 NOTE — Telephone Encounter (Signed)
Kaitlyn Jennings is very specific for GI tract receptors and her respiratory symptoms are unlikely to be related to [facility] infusion

## 2017-05-06 ENCOUNTER — Ambulatory Visit: Payer: Medicare Other | Admitting: Gastroenterology

## 2017-05-07 ENCOUNTER — Encounter: Payer: Self-pay | Admitting: Physician Assistant

## 2017-05-07 ENCOUNTER — Ambulatory Visit (INDEPENDENT_AMBULATORY_CARE_PROVIDER_SITE_OTHER): Payer: Medicare Other | Admitting: Physician Assistant

## 2017-05-07 ENCOUNTER — Other Ambulatory Visit (INDEPENDENT_AMBULATORY_CARE_PROVIDER_SITE_OTHER): Payer: Medicare Other

## 2017-05-07 VITALS — BP 132/92 | HR 92 | Ht 67.5 in | Wt 235.1 lb

## 2017-05-07 DIAGNOSIS — K5120 Ulcerative (chronic) proctitis without complications: Secondary | ICD-10-CM

## 2017-05-07 LAB — COMPREHENSIVE METABOLIC PANEL
ALBUMIN: 4.5 g/dL (ref 3.5–5.2)
ALT: 23 U/L (ref 0–35)
AST: 17 U/L (ref 0–37)
Alkaline Phosphatase: 80 U/L (ref 39–117)
BILIRUBIN TOTAL: 0.3 mg/dL (ref 0.2–1.2)
BUN: 11 mg/dL (ref 6–23)
CALCIUM: 10.1 mg/dL (ref 8.4–10.5)
CHLORIDE: 104 meq/L (ref 96–112)
CO2: 30 mEq/L (ref 19–32)
CREATININE: 0.71 mg/dL (ref 0.40–1.20)
GFR: 90.26 mL/min (ref >60.00)
Glucose, Bld: 129 mg/dL — ABNORMAL HIGH (ref 70–99)
Potassium: 3.8 mEq/L (ref 3.5–5.1)
Sodium: 141 mEq/L (ref 135–145)
Total Protein: 7.5 g/dL (ref 6.0–8.3)

## 2017-05-07 LAB — CBC WITH DIFFERENTIAL/PLATELET
BASOS PCT: 0.4 % (ref 0.0–3.0)
Basophils Absolute: 0 10*3/uL (ref 0.0–0.1)
EOS ABS: 0.3 10*3/uL (ref 0.0–0.7)
Eosinophils Relative: 3.3 % (ref 0.0–5.0)
HEMATOCRIT: 38.5 % (ref 36.0–46.0)
HEMOGLOBIN: 12.8 g/dL (ref 12.0–15.0)
LYMPHS PCT: 23.5 % (ref 12.0–46.0)
Lymphs Abs: 2.1 10*3/uL (ref 0.7–4.0)
MCHC: 33.3 g/dL (ref 30.0–36.0)
MCV: 86.6 fl (ref 78.0–100.0)
MONOS PCT: 8 % (ref 3.0–12.0)
Monocytes Absolute: 0.7 10*3/uL (ref 0.1–1.0)
NEUTROS ABS: 5.8 10*3/uL (ref 1.4–7.7)
Neutrophils Relative %: 64.8 % (ref 43.0–77.0)
Platelets: 396 10*3/uL (ref 150.0–400.0)
RBC: 4.45 Mil/uL (ref 3.87–5.11)
RDW: 13.4 % (ref 11.5–15.5)
WBC: 8.9 10*3/uL (ref 4.0–10.5)

## 2017-05-07 LAB — HIGH SENSITIVITY CRP: CRP HIGH SENSITIVITY: 3.93 mg/L (ref 0.000–5.000)

## 2017-05-07 NOTE — Progress Notes (Signed)
Subjective:    Patient ID: Kaitlyn Jennings, female    DOB: 10/25/1960, 56 y.o.   MRN: [id number]  HPI Kaitlyn Jennings is a very nice 56 year old white female known to Dr. Silverio Decamp who comes in today for follow-up after recently initiating Entyvio for ulcerative colitis.. Patient was initially diagnosed in 1998. She had been treated with Humira for about 6 years and came in last year year with persistent symptoms and was found to have an undetectable drug level and high antibody level. She was switched to [facility] in May 2017. She appeared to have some initial response but then symptoms increased, and she was having 3-4 diarrheal stools per day lower abdominal cramping and some bleeding. She underwent colonoscopy in April 2018 and was found to have severe proctitis, chills with a 12 mm polyp removed which was a sessile serrated adenoma and also has multiple diverticuli. Biopsy showed markedly active colitis with ulceration. She started 6-MP 50 mg per day in May 2018. She was then initiated   on Entyvio.Marland Kitchen Patient has been very stressed the summer as she is dealing with her elderly mother who has been sick. She has had some difficulty with scheduling infusions due to her mother being ill. Patient tells me today that she's not sleeping more than 2 hours a night because she lays wake worrying. Thus  far she has had 2 infusions, and is scheduled for her third infusion  September 13. She has had side effects of fatigue and significant rather generalized arthralgias. She says her the back of head and upper neck have been aching continuously and that most of her joints have been achy and uncomfortable. She says she generally just doesn't feel good. She has not had any fevers, she has had some vague nausea. Patient does have asthma and is having a lot of coughing but she says that is at her baseline. She has not seen any blood over the last couple of weeks and says that her stools are more formed. She continues to have some  intermittent lower abdominal cramping. She has been using Rowasa enemas at bedtime, and taking 6-MP as directed.  Review of Systems Pertinent positive and negative review of systems were noted in the above HPI section.  All other review of systems was otherwise negative.  Outpatient Encounter Prescriptions as of 05/07/2017  Medication Sig  . albuterol (PROVENTIL) (2.5 MG/3ML) 0.083% nebulizer solution Take 2.5 mg by nebulization as needed.    . Calcium Carbonate-Vitamin D (CALCIUM 500 + D PO) Take 1 capsule by mouth daily.    . Cholecalciferol (VITAMIN D) 1000 UNITS capsule Take 1,000 Units by mouth daily.    Marland Kitchen desvenlafaxine (PRISTIQ) 100 MG 24 hr tablet Take 100 mg by mouth daily.  Marland Kitchen dicyclomine (BENTYL) 20 MG tablet Take 1 tablet (20 mg total) by mouth 4 (four) times daily -  before meals and at bedtime.  Marland Kitchen esomeprazole (NEXIUM) 40 MG capsule Take 1 capsule (40 mg total) by mouth 2 (two) times daily before a meal.  . ferrous sulfate 325 (65 FE) MG tablet Take 325 mg by mouth daily with breakfast.    . fluticasone (FLONASE) 50 MCG/ACT nasal spray Place 2 sprays into both nostrils daily.  . fluticasone-salmeterol (ADVAIR HFA) 115-21 MCG/ACT inhaler Inhale 2 puffs into the lungs 2 (two) times daily.  . folic acid (FOLVITE) 1 MG tablet Take 1 tablet (1 mg total) by mouth daily.  Marland Kitchen HYDROcodone-acetaminophen (NORCO/VICODIN) 5-325 MG tablet Take 1 tablet by mouth every  6 (six) hours as needed for moderate pain. Take 1 tab every 6 hours as needed for pain.  . hydrocortisone (ANUSOL-HC) 25 MG suppository Insert one rectally 3 times a week  . loratadine (CLARITIN) 10 MG tablet Take 10 mg by mouth daily as needed for allergies.  Marland Kitchen mercaptopurine (PURINETHOL) 50 MG tablet Take 1 tablet (50 mg total) by mouth daily. Give on an empty stomach 1 hour before or 2 hours after meals. Caution: Chemotherapy.  . mesalamine (CANASA) 1000 MG suppository Place 1 suppository (1,000 mg total) rectally at bedtime.  .  mesalamine (ROWASA) 4 g enema Place 60 mLs (4 g total) rectally at bedtime. Hold for 8 hours  . montelukast (SINGULAIR) 10 MG tablet Take 10 mg by mouth at bedtime.  . ondansetron (ZOFRAN) 4 MG tablet Take 4 mg by mouth every 8 (eight) hours as needed.    . polyethylene glycol powder (GLYCOLAX/MIRALAX) powder Reported on 10/01/2015  . Probiotic Product (ALIGN) 4 MG CAPS Take 1 capsule by mouth daily.  . promethazine (PHENERGAN) 25 MG tablet Take 1 tablet (25 mg total) by mouth every 6 (six) hours as needed.  . rosuvastatin (CRESTOR) 10 MG tablet Take 10 mg by mouth daily.    . sucralfate (CARAFATE) 1 g tablet TAKE ONE TABLET BY MOUTH TWICE DAILY  . sulfaSALAzine (AZULFIDINE) 500 MG tablet Take 2 tablets (1,000 mg total) by mouth 2 (two) times daily.  . vedolizumab (ENTYVIO) 300 MG injection Inject into the vein every 8 (eight) weeks.  Marland Kitchen venlafaxine XR (EFFEXOR-XR) 150 MG 24 hr capsule Take 150 mg by mouth.  . vitamin B-12 (CYANOCOBALAMIN) 1000 MCG tablet Take 2 tablets by mouth once daily   . [DISCONTINUED] omeprazole (PRILOSEC) 40 MG capsule TAKE ONE CAPSULE BY MOUTH ONCE DAILY  . [DISCONTINUED] potassium chloride SA (KLOR-CON M20) 20 MEQ tablet Take two tablets by mouth daily for 4 days then 75meq daily thereafter  . [DISCONTINUED] rosuvastatin (CRESTOR) 10 MG tablet Take 10 mg by mouth.  . [DISCONTINUED] STELARA 90 MG/ML SOSY injection   . [DISCONTINUED] valsartan (DIOVAN) 160 MG tablet Take 160 mg by mouth daily.   Facility-Administered Encounter Medications as of 05/07/2017  Medication  . 0.9 %  sodium chloride infusion   Allergies  Allergen Reactions  . Molds & Smuts   . Remicade [Infliximab] Other (See Comments)    REACTION: lupus, joint pain   Patient Active Problem List   Diagnosis Date Noted  . B12 DEFICIENCY 03/26/2010  . GASTRITIS 02/04/2008  . ABDOMINAL PAIN, EPIGASTRIC 02/04/2008  . ANXIETY 09/25/2007  . ULCERATIVE COLITIS 09/25/2007  . IRRITABLE BOWEL SYNDROME  09/25/2007  . ARTHRITIS 09/25/2007  . PANCREATITIS, ACUTE, HX OF 09/25/2007   Social History   Social History  . Marital status: Married    Spouse name: N/A  . Number of children: 0  . Years of education: N/A   Occupational History  . FLIGHT ATTENDANT    Social History Main Topics  . Smoking status: Never Smoker  . Smokeless tobacco: Never Used  . Alcohol use No  . Drug use: No  . Sexual activity: Yes    Partners: Male    Birth control/ protection: Pill   Other Topics Concern  . Not on file   Social History Narrative  . No narrative on file    Ms. Sebastiano's family history includes COPD in her father; Diabetes in her maternal grandfather; Irritable bowel syndrome in her father.      Objective:  Vitals:   05/07/17 1328  BP: (!) 132/92  Pulse: 92    Physical Exam  developed white female in no acute distress, very pleasant blood pressure 132/92 pulse 92, height 5 foot 7, weight 235, BMI of 36.2. HEENT; nontraumatic normocephalic EOMI PERRLA sclera anicteric, Cardiovascular; regular rate and rhythm with S1-S2 no murmur or gallop, Pulmonary ;few faint wheezes, Abdomen ;soft, basically nontender nondistended no palpable mass or hepatosplenomegaly, Bowel sounds are active, Extremities ;no clubbing cyanosis or edema skin warm and dry, Neuropsych ;mood and affect appropriate       Assessment & Plan:   #48 56 year old female with severe ulcerative proctitis, currently undergoing induction with Entyvio (after development of antibodies to Humira, and lack of response to Stelara) Patient has completed the first 2 induction doses and is scheduled for the third mid September and we will then go to every 8 week infusions. She has had side effects of fatigue and fairly severe arthralgias. She has had some improvement in GI symptoms with decrease in diarrhea and rectal bleeding. She feels that she can deal with the side effects and is willing to continue with induction. #2  asthma  Plan; Kaitlyn Jennings induction and then continue 300 mg IV every 8 weeks. Advised patient we would continue therapy out at least 14 weeks before making decision whether she has had response. She will continue Rowasa enemas at bedtime for at least another 2 weeks, then may hold if she has not had any diarrhea or bleeding in the interim Continue 6-MP 50 mg by mouth by mouth daily CBC, hepatic panel and CRP today Patient knows to call should she have any change in her current status, and we'll plan to see her back in the office in about 6 weeks.  Zabdi Mis S Averlee Swartz PA-C 05/07/2017   Cc: Lilian Coma., MD

## 2017-05-07 NOTE — Patient Instructions (Addendum)
Please go to the basement level to have your labs drawn.   Continue Rowasa enemas for 2 more weeks, then you can stop if no bleeding. Continue other medicaitons, same doses.  Call us for any problems.   Take Tylenol as needed for muscle/joint aches.   After your next infusion, call our office for an appointment with either [facility], or Dr. Silverio Decamp.

## 2017-05-08 NOTE — Progress Notes (Signed)
Reviewed and agree with documentation and assessment and plan. K. Veena Jeorgia Helming , MD   

## 2017-06-07 ENCOUNTER — Other Ambulatory Visit: Payer: Self-pay | Admitting: Gastroenterology

## 2017-06-08 ENCOUNTER — Ambulatory Visit (INDEPENDENT_AMBULATORY_CARE_PROVIDER_SITE_OTHER): Payer: Medicare Other | Admitting: Gastroenterology

## 2017-06-08 ENCOUNTER — Other Ambulatory Visit (INDEPENDENT_AMBULATORY_CARE_PROVIDER_SITE_OTHER): Payer: Medicare Other

## 2017-06-08 ENCOUNTER — Encounter: Payer: Self-pay | Admitting: Gastroenterology

## 2017-06-08 VITALS — BP 130/84 | HR 80 | Ht 67.5 in | Wt 235.2 lb

## 2017-06-08 DIAGNOSIS — K5120 Ulcerative (chronic) proctitis without complications: Secondary | ICD-10-CM | POA: Diagnosis not present

## 2017-06-08 DIAGNOSIS — H8110 Benign paroxysmal vertigo, unspecified ear: Secondary | ICD-10-CM | POA: Diagnosis not present

## 2017-06-08 DIAGNOSIS — K219 Gastro-esophageal reflux disease without esophagitis: Secondary | ICD-10-CM | POA: Diagnosis not present

## 2017-06-08 DIAGNOSIS — K5180 Other ulcerative colitis without complications: Secondary | ICD-10-CM | POA: Diagnosis not present

## 2017-06-08 LAB — COMPREHENSIVE METABOLIC PANEL
ALBUMIN: 4.4 g/dL (ref 3.5–5.2)
ALT: 16 U/L (ref 0–35)
AST: 12 U/L (ref 0–37)
Alkaline Phosphatase: 79 U/L (ref 39–117)
BILIRUBIN TOTAL: 0.3 mg/dL (ref 0.2–1.2)
BUN: 14 mg/dL (ref 6–23)
CO2: 29 mEq/L (ref 19–32)
Calcium: 10.1 mg/dL (ref 8.4–10.5)
Chloride: 103 mEq/L (ref 96–112)
Creatinine, Ser: 0.7 mg/dL (ref 0.40–1.20)
GFR: 91.72 mL/min (ref >60.00)
Glucose, Bld: 114 mg/dL — ABNORMAL HIGH (ref 70–99)
Potassium: 3.9 mEq/L (ref 3.5–5.1)
SODIUM: 140 meq/L (ref 135–145)
TOTAL PROTEIN: 7 g/dL (ref 6.0–8.3)

## 2017-06-08 LAB — CBC WITH DIFFERENTIAL/PLATELET
BASOS ABS: 0 10*3/uL (ref 0.0–0.1)
Basophils Relative: 0.5 % (ref 0.0–3.0)
EOS ABS: 0.2 10*3/uL (ref 0.0–0.7)
EOS PCT: 2 % (ref 0.0–5.0)
HCT: 38.7 % (ref 36.0–46.0)
HEMOGLOBIN: 12.9 g/dL (ref 12.0–15.0)
Lymphocytes Relative: 29.8 % (ref 12.0–46.0)
Lymphs Abs: 2.4 10*3/uL (ref 0.7–4.0)
MCHC: 33.2 g/dL (ref 30.0–36.0)
MCV: 86.4 fl (ref 78.0–100.0)
MONO ABS: 0.6 10*3/uL (ref 0.1–1.0)
Monocytes Relative: 7.2 % (ref 3.0–12.0)
NEUTROS PCT: 60.5 % (ref 43.0–77.0)
Neutro Abs: 4.9 10*3/uL (ref 1.4–7.7)
Platelets: 399 10*3/uL (ref 150.0–400.0)
RBC: 4.48 Mil/uL (ref 3.87–5.11)
RDW: 13.8 % (ref 11.5–15.5)
WBC: 8.1 10*3/uL (ref 4.0–10.5)

## 2017-06-08 LAB — FERRITIN: FERRITIN: 68.4 ng/mL (ref 10.0–291.0)

## 2017-06-08 LAB — FOLATE

## 2017-06-08 LAB — HIGH SENSITIVITY CRP: CRP, High Sensitivity: 2.72 mg/L (ref 0.000–5.000)

## 2017-06-08 LAB — VITAMIN B12: VITAMIN B 12: 406 pg/mL (ref 211–911)

## 2017-06-08 MED ORDER — FOLIC ACID 1 MG PO TABS: 1.0000 mg | ORAL_TABLET | Freq: Every day | ORAL | 3 refills | Status: DC

## 2017-06-08 MED ORDER — SUCRALFATE 1 G PO TABS: 1.0000 g | ORAL_TABLET | Freq: Two times a day (BID) | ORAL | 3 refills | Status: DC

## 2017-06-08 MED ORDER — MERCAPTOPURINE 50 MG PO TABS: 50.0000 mg | ORAL_TABLET | Freq: Every day | ORAL | 3 refills | Status: DC

## 2017-06-08 MED ORDER — SULFASALAZINE 500 MG PO TABS: 1000.0000 mg | ORAL_TABLET | Freq: Two times a day (BID) | ORAL | 3 refills | Status: DC

## 2017-06-08 MED ORDER — ESOMEPRAZOLE MAGNESIUM 40 MG PO CPDR: 40.0000 mg | DELAYED_RELEASE_CAPSULE | Freq: Two times a day (BID) | ORAL | 3 refills | Status: DC

## 2017-06-08 NOTE — Patient Instructions (Signed)
Go to the basement for labs today  We will send 90 day prescriptions of your refills to [facility]

## 2017-06-08 NOTE — Progress Notes (Signed)
Kaitlyn Jennings    [id number]    [birth year]  Primary Care Physician:Jobe, Alexis Goodell., MD  Referring Physician: Lilian Coma., MD [address], [place] [address]  Chief complaint:  Ulcerative colitisnnn  HPI: 56 year old female with history of ulcerative colitis here for follow-up visit after completion of induction dose of Entyvio.  She was diagnosed with ulcerative colitis in 1998, treated with Humira, had UC flare in 2016 with persistent symptoms, noted undetectable drug trough with elevated antibody level. She was switched to [facility] in May 2017 , she initially responded well and was in remission for a few months but started having recurrent symptoms with increased stool frequency and blood per rectum. Colonoscopy in April 2018 showed severe proctitis, biopsies showed active colitis with ulceration . She completed her last induction dose of Entyvio 2 weeks ago. Currently not on mesalamine enema or suppository. She reports formed 2-3 bowel movements daily and no blood in stool.  He is having generalized body ache, fatigue and joint pain. She is also having vertigo and sinus congestion, wondering if it's related to [facility]. Denies any  vomiting, abdominal pain, melena or bright red blood per rectum   Outpatient Encounter Prescriptions as of 06/08/2017  Medication Sig  . albuterol (PROVENTIL) (2.5 MG/3ML) 0.083% nebulizer solution Take 2.5 mg by nebulization as needed.    . Calcium Carbonate-Vitamin D (CALCIUM 500 + D PO) Take 1 capsule by mouth daily.    . Cholecalciferol (VITAMIN D) 1000 UNITS capsule Take 1,000 Units by mouth daily.    Marland Kitchen dicyclomine (BENTYL) 20 MG tablet Take 1 tablet (20 mg total) by mouth 4 (four) times daily -  before meals and at bedtime.  Marland Kitchen esomeprazole (NEXIUM) 40 MG capsule TAKE ONE CAPSULE BY MOUTH TWICE DAILY BEFORE A MEAL  . ferrous sulfate 325 (65 FE) MG tablet Take 325 mg by mouth daily with breakfast.    . fluticasone  (FLONASE) 50 MCG/ACT nasal spray Place 2 sprays into both nostrils daily.  . fluticasone-salmeterol (ADVAIR HFA) 115-21 MCG/ACT inhaler Inhale 2 puffs into the lungs 2 (two) times daily.  . folic acid (FOLVITE) 1 MG tablet Take 1 tablet (1 mg total) by mouth daily.  Marland Kitchen HYDROcodone-acetaminophen (NORCO/VICODIN) 5-325 MG tablet Take 1 tablet by mouth every 6 (six) hours as needed for moderate pain. Take 1 tab every 6 hours as needed for pain.  . hydrocortisone (ANUSOL-HC) 25 MG suppository Insert one rectally 3 times a week  . loratadine (CLARITIN) 10 MG tablet Take 10 mg by mouth daily as needed for allergies.  Marland Kitchen mercaptopurine (PURINETHOL) 50 MG tablet Take 1 tablet (50 mg total) by mouth daily. Give on an empty stomach 1 hour before or 2 hours after meals. Caution: Chemotherapy.  . mesalamine (CANASA) 1000 MG suppository Place 1 suppository (1,000 mg total) rectally at bedtime.  . montelukast (SINGULAIR) 10 MG tablet Take 10 mg by mouth at bedtime.  . ondansetron (ZOFRAN) 4 MG tablet Take 4 mg by mouth every 8 (eight) hours as needed.    . polyethylene glycol powder (GLYCOLAX/MIRALAX) powder Reported on 10/01/2015  . Probiotic Product (ALIGN) 4 MG CAPS Take 1 capsule by mouth daily.  . promethazine (PHENERGAN) 25 MG tablet Take 1 tablet (25 mg total) by mouth every 6 (six) hours as needed.  . rosuvastatin (CRESTOR) 10 MG tablet Take 10 mg by mouth daily.    . sucralfate (CARAFATE) 1 g tablet TAKE  ONE TABLET BY MOUTH TWICE DAILY  . sulfaSALAzine (AZULFIDINE) 500 MG tablet Take 2 tablets (1,000 mg total) by mouth 2 (two) times daily.  . vedolizumab (ENTYVIO) 300 MG injection Inject into the vein every 8 (eight) weeks.  . vitamin B-12 (CYANOCOBALAMIN) 1000 MCG tablet Take 2 tablets by mouth once daily   . [DISCONTINUED] mesalamine (ROWASA) 4 g enema Place 60 mLs (4 g total) rectally at bedtime. Hold for 8 hours  . desvenlafaxine (PRISTIQ) 100 MG 24 hr tablet Take 100 mg by mouth daily.  Marland Kitchen venlafaxine  XR (EFFEXOR-XR) 150 MG 24 hr capsule Take 150 mg by mouth.   Facility-Administered Encounter Medications as of 06/08/2017  Medication  . 0.9 %  sodium chloride infusion    Allergies as of 06/08/2017 - Review Complete 06/08/2017  Allergen Reaction Noted  . Molds & smuts  02/09/2013  . Remicade [infliximab] Other (See Comments) 08/26/2006    Past Medical History:  Diagnosis Date  . Anxiety   . 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 Asthma   . B12 deficiency   . Chronic gastritis   . GERD (gastroesophageal reflux disease)   . Hyperlipidemia   . IBS (irritable bowel syndrome)   . 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 Pancreatitis   . Rectal polyp 10/01/2015  . Ulcerative colitis     Past Surgical History:  Procedure Laterality Date  . BREAST CYST ASPIRATION     left  . ESOPHAGUS SURGERY    . SINUS SURGERY WITH INSTATRAK      Family History  Problem Relation Age of Onset  . Irritable bowel syndrome Father   . COPD Father   . Diabetes Maternal Grandfather   . Colon cancer Neg Hx   . Stomach cancer Neg Hx     Social History   Social History  . Marital status: Married    Spouse name: N/A  . Number of children: 0  . Years of education: N/A   Occupational History  . FLIGHT ATTENDANT    Social History Main Topics  . Smoking status: Never Smoker  . Smokeless tobacco: Never Used  . Alcohol use No  . Drug use: No  . Sexual activity: Yes    Partners: Male    Birth control/ protection: Pill   Other Topics Concern  . Not on file   Social History Narrative  . No narrative on file      Review of systems: Review of Systems  Constitutional: Negative for fever and chills.Positive for fatigue HENT: Positive for sinus problems   Eyes: Negative for blurred vision.  Respiratory: Positive for cough, shortness of breath and wheezing.   Cardiovascular: Negative for chest pain and palpitations.  Gastrointestinal: as per HPI Genitourinary: Negative for dysuria, urgency,  frequency and hematuria.  Musculoskeletal: Positive for myalgias, back pain and joint pain.  Skin: Negative for itching and rash.  Neurological: Negative for  tremors, focal weakness, seizures and loss of consciousness.  positive for dizziness Endo/Heme/Allergies: Positive for seasonal allergies.  Psychiatric/Behavioral: Negative for depression, suicidal ideas and hallucinations.  All other systems reviewed and are negative.   Physical Exam: Vitals:   06/08/17 1550  BP: 130/84  Pulse: 80   Body mass index is 36.3 kg/m. Gen:      No acute distress HEENT:  EOMI, sclera anicteric Neck:     No masses; no thyromegaly Lungs:    Clear to auscultation bilaterally; normal respiratory effort CV:  Regular rate and rhythm; no murmurs Abd:      + bowel sounds; soft, non-tender; no palpable masses, no distension Ext:    No edema; adequate peripheral perfusion Skin:      Warm and dry; no rash Neuro: alert and oriented x 3 Psych: normal mood and affect  Data Reviewed:  Reviewed labs, radiology imaging, old records and pertinent past GI work up   Assessment and Plan/Recommendations: 56 year old female with ulcerative colitis, severe proctitis with ulceration noted on colonoscopy in April 2018, status post induction with Entyvio in clinical remission Continue Entyvio infusion every 8 weeks for maintenance Continue 6-MP, sulfasalazine, folic acid Follow-up CBC, CMP, CRP, ferritin and B12/ folate  GERD: Continue Nexium Antireflux measures  Sinus congestion and vertigo (?benign proximal positional vertigo): Advised patient to follow up with ENT Trying antihistamine, Zyrtec daily for 7-10 days  Return in 2-3 months or sooner if needed  25 minutes was spent face-to-face with the patient. Greater than 50% of the time used for counseling as well as treatment plan and follow-up. She had multiple questions which were answered to her satisfaction  K. Denzil Magnuson , MD [phone] Mon-Fri  8a-5p [phone] after 5p, weekends, holidays  CC: Lilian Coma., MD

## 2017-07-29 ENCOUNTER — Telehealth: Payer: Self-pay | Admitting: Gastroenterology

## 2017-07-29 NOTE — Telephone Encounter (Signed)
Appointment scheduled for 09/29/17

## 2017-07-29 NOTE — Telephone Encounter (Signed)
She is fine to wait until January. Tell her I recommend she schedule the January appointment now. It is okay to use a new patient spot if neccessary. Thank you

## 2017-08-04 ENCOUNTER — Ambulatory Visit: Payer: Medicare Other | Admitting: Gastroenterology

## 2017-08-06 ENCOUNTER — Telehealth: Payer: Self-pay | Admitting: Gastroenterology

## 2017-08-06 NOTE — Telephone Encounter (Signed)
A user error has taken place: ERROR °

## 2017-08-27 ENCOUNTER — Telehealth: Payer: Self-pay | Admitting: *Deleted

## 2017-08-27 NOTE — Telephone Encounter (Signed)
We received a unsigned  request from [facility] for colonoscopy and path report, called pt to verify she wants this sent in before I fax over  L/M for pt to return my call

## 2017-08-27 NOTE — Telephone Encounter (Signed)
Spoke with patient and she verbally consented for me to fax in her colonoscopy and path report to Dr Theodis Sato

## 2017-09-02 ENCOUNTER — Ambulatory Visit: Payer: Medicare Other | Admitting: Gastroenterology

## 2017-09-29 ENCOUNTER — Ambulatory Visit: Payer: Medicare Other | Admitting: Gastroenterology

## 2017-10-19 ENCOUNTER — Encounter: Payer: Self-pay | Admitting: Physician Assistant

## 2017-10-19 ENCOUNTER — Ambulatory Visit: Payer: Medicare Other | Admitting: Physician Assistant

## 2017-10-19 ENCOUNTER — Other Ambulatory Visit (INDEPENDENT_AMBULATORY_CARE_PROVIDER_SITE_OTHER): Payer: Medicare Other

## 2017-10-19 VITALS — BP 140/80 | HR 78 | Ht 68.5 in | Wt 243.0 lb

## 2017-10-19 DIAGNOSIS — Z86010 Personal history of colonic polyps: Secondary | ICD-10-CM

## 2017-10-19 DIAGNOSIS — K51919 Ulcerative colitis, unspecified with unspecified complications: Secondary | ICD-10-CM | POA: Diagnosis not present

## 2017-10-19 LAB — CBC WITH DIFFERENTIAL/PLATELET
BASOS PCT: 0.6 %
Basophils Absolute: 41 cells/uL (ref 0–200)
Eosinophils Absolute: 449 cells/uL (ref 15–500)
Eosinophils Relative: 6.6 %
HCT: 36.6 % (ref 35.0–45.0)
Hemoglobin: 12 g/dL (ref 11.7–15.5)
Lymphs Abs: 1741 cells/uL (ref 850–3900)
MCH: 28 pg (ref 27.0–33.0)
MCHC: 32.8 g/dL (ref 32.0–36.0)
MCV: 85.3 fL (ref 80.0–100.0)
MPV: 10 fL (ref 7.5–12.5)
Monocytes Relative: 7.5 %
NEUTROS PCT: 59.7 %
Neutro Abs: 4060 cells/uL (ref 1500–7800)
PLATELETS: 360 10*3/uL (ref 140–400)
RBC: 4.29 10*6/uL (ref 3.80–5.10)
RDW: 13 % (ref 11.0–15.0)
TOTAL LYMPHOCYTE: 25.6 %
WBC: 6.8 10*3/uL (ref 3.8–10.8)
WBCMIX: 510 {cells}/uL (ref 200–950)

## 2017-10-19 LAB — COMPREHENSIVE METABOLIC PANEL
ALT: 14 U/L (ref 0–35)
AST: 13 U/L (ref 0–37)
Albumin: 4.3 g/dL (ref 3.5–5.2)
Alkaline Phosphatase: 79 U/L (ref 39–117)
BILIRUBIN TOTAL: 0.3 mg/dL (ref 0.2–1.2)
BUN: 14 mg/dL (ref 6–23)
CALCIUM: 9.6 mg/dL (ref 8.4–10.5)
CHLORIDE: 105 meq/L (ref 96–112)
CO2: 27 meq/L (ref 19–32)
CREATININE: 0.7 mg/dL (ref 0.40–1.20)
GFR: 91.6 mL/min (ref >60.00)
Glucose, Bld: 119 mg/dL — ABNORMAL HIGH (ref 70–99)
Potassium: 4.2 mEq/L (ref 3.5–5.1)
SODIUM: 143 meq/L (ref 135–145)
Total Protein: 7.3 g/dL (ref 6.0–8.3)

## 2017-10-19 LAB — HIGH SENSITIVITY CRP: CRP HIGH SENSITIVITY: 2.63 mg/L (ref 0.000–5.000)

## 2017-10-19 MED ORDER — VSL#3 PO CAPS: 1.0000 | ORAL_CAPSULE | Freq: Two times a day (BID) | ORAL | 0 refills | Status: DC

## 2017-10-19 NOTE — Patient Instructions (Addendum)
Please go to the basement level to have your labs drawn.  Take the over the counter VSL #3 capsules- twice daily. You can get these at your pharmacy. If you are age 58 or younger, your body mass index should be between 19-25. Your Body mass index is 36.41 kg/m. If this is out of the aformentioned range listed, please consider follow up with your Primary Care Provider.   Call in April for a June appointment with Dr. Silverio Decamp.

## 2017-10-19 NOTE — Progress Notes (Signed)
Subjective:    Patient ID: Kaitlyn Jennings, female    DOB: 12/21/1960, 57 y.o.   MRN: [id number]  HPI Kaitlyn" Manuela Jennings" is a pleasant 57 year old white female, known to Dr. Silverio Decamp and myself. She has long history of ulcerative colitis initially diagnosed in 1998, also with IBS and history of B12 deficiency. She was last seen in the office in October 2018 by Dr. Silverio Decamp after she had completed induction of Entyvio.. Last colonoscopy in April 2018 showed severe proctitis C showed active colitis with ulceration. After completing induction with Entyvio  she was doing well with 2 or 3 bowel movements per day and no bleeding. She had some complaints of fatigue. She comes in today for follow-up stating that she continues to do well. Unfortunately her mother died within the past 2 months and she has been grieving. She also unfortunately contracted influenza which took her  about 3 weeks to get over. She has some mild abdominal cramping off and on but has not been having any problems with diarrhea. She reports that her stools are formed, occasionally sees some mucus but has not had any blood.   Review of Systems Pertinent positive and negative review of systems were noted in the above HPI section.  All other review of systems was otherwise negative.  Outpatient Encounter Medications as of 10/19/2017  Medication Sig  . albuterol (PROVENTIL) (2.5 MG/3ML) 0.083% nebulizer solution Take 2.5 mg by nebulization as needed.    . Calcium Carbonate-Vitamin D (CALCIUM 500 + D PO) Take 1 capsule by mouth daily.    . Cholecalciferol (VITAMIN D) 1000 UNITS capsule Take 1,000 Units by mouth daily.    Marland Kitchen dicyclomine (BENTYL) 20 MG tablet Take 1 tablet (20 mg total) by mouth 4 (four) times daily -  before meals and at bedtime.  Marland Kitchen esomeprazole (NEXIUM) 40 MG capsule Take 1 capsule (40 mg total) by mouth 2 (two) times daily before a meal.  . ferrous sulfate 325 (65 FE) MG tablet Take 325 mg by mouth daily with breakfast.    .  fluticasone (FLONASE) 50 MCG/ACT nasal spray Place 2 sprays into both nostrils daily.  . fluticasone-salmeterol (ADVAIR HFA) 115-21 MCG/ACT inhaler Inhale 2 puffs into the lungs 2 (two) times daily.  . folic acid (FOLVITE) 1 MG tablet Take 1 tablet (1 mg total) by mouth daily.  Marland Kitchen HYDROcodone-acetaminophen (NORCO/VICODIN) 5-325 MG tablet Take 1 tablet by mouth every 6 (six) hours as needed for moderate pain. Take 1 tab every 6 hours as needed for pain.  . hydrocortisone (ANUSOL-HC) 25 MG suppository Insert one rectally 3 times a week  . loratadine (CLARITIN) 10 MG tablet Take 10 mg by mouth daily as needed for allergies.  Marland Kitchen mercaptopurine (PURINETHOL) 50 MG tablet Take 1 tablet (50 mg total) by mouth daily. Give on an empty stomach 1 hour before or 2 hours after meals. Caution: Chemotherapy.  . mesalamine (CANASA) 1000 MG suppository Place 1 suppository (1,000 mg total) rectally at bedtime.  . montelukast (SINGULAIR) 10 MG tablet Take 10 mg by mouth at bedtime.  . ondansetron (ZOFRAN) 4 MG tablet Take 4 mg by mouth every 8 (eight) hours as needed.    . polyethylene glycol powder (GLYCOLAX/MIRALAX) powder Reported on 10/01/2015  . Probiotic Product (ALIGN) 4 MG CAPS Take 1 capsule by mouth daily.  . promethazine (PHENERGAN) 25 MG tablet Take 1 tablet (25 mg total) by mouth every 6 (six) hours as needed.  . rosuvastatin (CRESTOR) 10 MG tablet Take 10  mg by mouth daily.    . sucralfate (CARAFATE) 1 g tablet Take 1 tablet (1 g total) by mouth 2 (two) times daily.  Marland Kitchen sulfaSALAzine (AZULFIDINE) 500 MG tablet Take 2 tablets (1,000 mg total) by mouth 2 (two) times daily.  . vedolizumab (ENTYVIO) 300 MG injection Inject into the vein every 8 (eight) weeks.  . vitamin B-12 (CYANOCOBALAMIN) 1000 MCG tablet Take 2 tablets by mouth once daily   . desvenlafaxine (PRISTIQ) 100 MG 24 hr tablet Take 100 mg by mouth daily.  . Probiotic Product (VSL#3) CAPS Take 1 capsule by mouth 2 (two) times daily.  Marland Kitchen venlafaxine  XR (EFFEXOR-XR) 150 MG 24 hr capsule Take 150 mg by mouth.   Facility-Administered Encounter Medications as of 10/19/2017  Medication  . 0.9 %  sodium chloride infusion   Allergies  Allergen Reactions  . Molds & Smuts   . Remicade [Infliximab] Other (See Comments)    REACTION: lupus, joint pain   Patient Active Problem List   Diagnosis Date Noted  . B12 DEFICIENCY 03/26/2010  . GASTRITIS 02/04/2008  . ABDOMINAL PAIN, EPIGASTRIC 02/04/2008  . ANXIETY 09/25/2007  . ULCERATIVE COLITIS 09/25/2007  . IRRITABLE BOWEL SYNDROME 09/25/2007  . ARTHRITIS 09/25/2007  . PANCREATITIS, ACUTE, HX OF 09/25/2007   Social History   Socioeconomic History  . Marital status: Married    Spouse name: Not on file  . Number of children: 0  . Years of education: Not on file  . Highest education level: Not on file  Social Needs  . Financial resource strain: Not on file  . Food insecurity - worry: Not on file  . Food insecurity - inability: Not on file  . Transportation needs - medical: Not on file  . Transportation needs - non-medical: Not on file  Occupational History  . Occupation: FLIGHT ATTENDANT  Tobacco Use  . Smoking status: Never Smoker  . Smokeless tobacco: Never Used  Substance and Sexual Activity  . Alcohol use: No    Alcohol/week: 0.0 oz  . Drug use: No  . Sexual activity: Yes    Partners: Male    Birth control/protection: Pill  Other Topics Concern  . Not on file  Social History Narrative  . Not on file    Ms. Erker's family history includes COPD in her father; Diabetes in her maternal grandfather; Irritable bowel syndrome in her father.      Objective:    Vitals:   10/19/17 0901  BP: 140/80  Pulse: 78    Physical Exam; well-developed white female in no acute distress, pleasant blood pressure 140/80 pulse 78, height 5 foot 8, weight 243, BMI 36.4. HEENT; nontraumatic normocephalic EOMI PERRLA sclera anicteric, Cardiovascular ;regular rate and rhythm with S1-S2 no  murmur rub or gallop, Pulmonary; clear bilaterally, Abdomen; soft, there is no focal tenderness no guarding or rebound no palpable mass or hepatosplenomegaly bowel sounds present, Rectal; exam not done, Ext; no clubbing cyanosis or edema skin warm and dry, Neuropsych; mood and affect appropriate       Assessment & Plan:   #56 57 year old white female with long history of ulcerative colitis, primarily left sided with most recent colonoscopy April 2018 showing severe proctitis. Patient had previously been on Humira, developed antibodies, and was switched to [facility]  May 2018, did not have good response and is now on Entyvio since fall 2018 . She is doing well and clinically appears to be in remission. She also continues on 6-MP 50 mg daily and Azulfidine -  thousand milligrams twice daily  #2 chronic GERD stable on Nexium 40 mg by mouth every morning #3 history of sessile serrated polyp on colonoscopy April 2018, with long-standing ulcerative colitis will need repeat surveillance at least  At a 2 year interval .  Plan ; Continue current regimen -Entyvio 300 mg IV every 8 weeks   6-MP 50 mg by mouth every morning   Azulfidine 1000 mg twice a day Check CRP and CBC and CMET Patient asks about a good probiotic with ulcerative colitis, suggested she try VSL # 3 112.5 Bill   twice daily-samples were given Patient will follow-up with Dr. Silverio Decamp in 4 months and knows to call in the interim for any problems.     Luc Shammas S Leniyah Martell PA-C 10/19/2017   Cc: Lilian Coma., MD

## 2018-01-12 ENCOUNTER — Other Ambulatory Visit: Payer: Self-pay | Admitting: Gastroenterology

## 2018-02-08 ENCOUNTER — Telehealth: Payer: Self-pay | Admitting: Gastroenterology

## 2018-02-08 NOTE — Telephone Encounter (Signed)
Please schedule office visit to discuss.  Thanks 

## 2018-02-08 NOTE — Telephone Encounter (Signed)
She has an appointment 02/18/18

## 2018-02-10 ENCOUNTER — Telehealth: Payer: Self-pay | Admitting: Gastroenterology

## 2018-02-10 NOTE — Telephone Encounter (Signed)
Spoke with patient. She is aware there is concern about her treatment for Crohn's [facility]). She understands the treatment is important. She has been dealing with the death of her mother, her own depression, and her asthma with frequent URI. She doesn't want to be dismissed for non-compliance. Hopes to discuss alternatives.

## 2018-02-10 NOTE — Telephone Encounter (Signed)
Patient is scheduled   

## 2018-02-10 NOTE — Telephone Encounter (Signed)
Patient calling stating she has questions about her infusion and would like to speak with the nurse. Pt schedule for ov on 6.13.19

## 2018-02-10 NOTE — Telephone Encounter (Signed)
Ok, please schedule office visit soon, if I have no available appt, please bring her in with extender. Thanks

## 2018-02-18 ENCOUNTER — Other Ambulatory Visit (INDEPENDENT_AMBULATORY_CARE_PROVIDER_SITE_OTHER): Payer: Medicare Other

## 2018-02-18 ENCOUNTER — Encounter: Payer: Self-pay | Admitting: Gastroenterology

## 2018-02-18 ENCOUNTER — Ambulatory Visit: Payer: Medicare Other | Admitting: Gastroenterology

## 2018-02-18 VITALS — BP 124/90 | HR 64 | Ht 68.0 in | Wt 240.6 lb

## 2018-02-18 DIAGNOSIS — J4520 Mild intermittent asthma, uncomplicated: Secondary | ICD-10-CM | POA: Diagnosis not present

## 2018-02-18 DIAGNOSIS — K5190 Ulcerative colitis, unspecified, without complications: Secondary | ICD-10-CM

## 2018-02-18 DIAGNOSIS — J209 Acute bronchitis, unspecified: Secondary | ICD-10-CM

## 2018-02-18 DIAGNOSIS — J45909 Unspecified asthma, uncomplicated: Secondary | ICD-10-CM

## 2018-02-18 LAB — CBC WITH DIFFERENTIAL/PLATELET
BASOS PCT: 0.6 % (ref 0.0–3.0)
Basophils Absolute: 0 10*3/uL (ref 0.0–0.1)
EOS PCT: 7.5 % — AB (ref 0.0–5.0)
Eosinophils Absolute: 0.5 10*3/uL (ref 0.0–0.7)
HCT: 34.9 % — ABNORMAL LOW (ref 36.0–46.0)
HEMOGLOBIN: 11.7 g/dL — AB (ref 12.0–15.0)
LYMPHS ABS: 1.7 10*3/uL (ref 0.7–4.0)
Lymphocytes Relative: 24.1 % (ref 12.0–46.0)
MCHC: 33.5 g/dL (ref 30.0–36.0)
MCV: 86.2 fl (ref 78.0–100.0)
MONO ABS: 0.4 10*3/uL (ref 0.1–1.0)
Monocytes Relative: 6.5 % (ref 3.0–12.0)
NEUTROS ABS: 4.2 10*3/uL (ref 1.4–7.7)
NEUTROS PCT: 61.3 % (ref 43.0–77.0)
Platelets: 341 10*3/uL (ref 150.0–400.0)
RBC: 4.05 Mil/uL (ref 3.87–5.11)
RDW: 14.6 % (ref 11.5–15.5)
WBC: 6.9 10*3/uL (ref 4.0–10.5)

## 2018-02-18 LAB — COMPREHENSIVE METABOLIC PANEL
ALT: 14 U/L (ref 0–35)
AST: 12 U/L (ref 0–37)
Albumin: 4.4 g/dL (ref 3.5–5.2)
Alkaline Phosphatase: 93 U/L (ref 39–117)
BUN: 15 mg/dL (ref 6–23)
CHLORIDE: 106 meq/L (ref 96–112)
CO2: 28 meq/L (ref 19–32)
Calcium: 9.9 mg/dL (ref 8.4–10.5)
Creatinine, Ser: 0.8 mg/dL (ref 0.40–1.20)
GFR: 78.42 mL/min (ref >60.00)
GLUCOSE: 120 mg/dL — AB (ref 70–99)
POTASSIUM: 3.9 meq/L (ref 3.5–5.1)
SODIUM: 141 meq/L (ref 135–145)
Total Bilirubin: 0.3 mg/dL (ref 0.2–1.2)
Total Protein: 7.2 g/dL (ref 6.0–8.3)

## 2018-02-18 LAB — C-REACTIVE PROTEIN: CRP: 0.3 mg/dL — AB (ref 0.5–20.0)

## 2018-02-18 LAB — FERRITIN: Ferritin: 48.4 ng/mL (ref 10.0–291.0)

## 2018-02-18 LAB — VITAMIN B12: VITAMIN B 12: 1303 pg/mL — AB (ref 211–911)

## 2018-02-18 LAB — FOLATE: Folate: >24.1 ng/mL (ref >5.9)

## 2018-02-18 MED ORDER — TOFACITINIB CITRATE 10 MG PO TABS: 1.0000 | ORAL_TABLET | Freq: Every day | ORAL | 3 refills | Status: DC

## 2018-02-18 MED ORDER — TOFACITINIB CITRATE 10 MG PO TABS: 1.0000 | ORAL_TABLET | Freq: Two times a day (BID) | ORAL | 1 refills | Status: DC

## 2018-02-18 NOTE — Patient Instructions (Addendum)
Go to the basement for labs today  We will refer you to Pulmonology and they will contact you with that appointment  We have sent in a new prescription to your pharmacy  Kaitlyn Jennings   If you are age 58 or older, your body mass index should be between 23-30. Your Body mass index is 36.58 kg/m. If this is out of the aforementioned range listed, please consider follow up with your Primary Care Provider.  If you are age 46 or younger, your body mass index should be between 19-25. Your Body mass index is 36.58 kg/m. If this is out of the aformentioned range listed, please consider follow up with your Primary Care Provider.

## 2018-02-18 NOTE — Progress Notes (Signed)
Kaitlyn Jennings    [id number]    Jul 23, 1961  Primary Care Physician:Beane, Mirian Mo, PA  Referring Physician: Manfred Shirts, [place] [address], [place] [id number]  Chief complaint: Ulcerative colitis  HPI: 57 year old female with ulcerative colitis initially diagnosed in 1998 is here for follow-up visit. Patient had allergic reaction to Remicade, subsequently was maintained on Humira with loss of clinical response in 2016, noted to have elevated anti-body level with undetectable drug trough for Humira.  She was switched to [facility] in May 2017, patient stopped taking it after initial induction period due to insurance issue and she also said that she was losing the response though it is unclear.  She was subsequently switched to [facility].  She has missed multiple sessions of infusion, last infusion was in February 2019. Recently her mom passed away last thanksgiving, and she is depressed, also under tremendous stress trying to take care of her affairs.  She is continuing 6-MP and sulfasalzine She has been on frequent doses of prednisone and Z-Pak in the past few months, patient states she has been prescribed them for flulike symptoms by her PMD. She has history of asthma and appears that she is having frequent exacerbations.. Not followed by Pulmonary.  Bowel habits at baseline, 3 semi formed soft stool, denies any blood in stool or blood per rectum.  No nausea, vomiting, abdominal pain, fever, joint pains, skin rash, dysphagia or odynophagia.      Outpatient Encounter Medications as of 02/18/2018  Medication Sig  . albuterol (PROVENTIL) (2.5 MG/3ML) 0.083% nebulizer solution Take 2.5 mg by nebulization as needed.    Marland Kitchen buPROPion (WELLBUTRIN XL) 150 MG 24 hr tablet Take 1 tablet by mouth daily.  . Calcium Carbonate-Vitamin D (CALCIUM 500 + D PO) Take 1 capsule by mouth daily.    . Cholecalciferol (VITAMIN D) 1000 UNITS capsule Take 1,000 Units by mouth daily.    Marland Kitchen dicyclomine  (BENTYL) 20 MG tablet TAKE 1 TABLET BY MOUTH 4 TIMES DAILY WITH MEALS AND  AT BEDTIME  . esomeprazole (NEXIUM) 40 MG capsule Take 1 capsule (40 mg total) by mouth 2 (two) times daily before a meal.  . ferrous sulfate 325 (65 FE) MG tablet Take 325 mg by mouth daily with breakfast.    . fluticasone (FLONASE) 50 MCG/ACT nasal spray Place 2 sprays into both nostrils daily.  . folic acid (FOLVITE) 1 MG tablet Take 1 tablet (1 mg total) by mouth daily.  Marland Kitchen HYDROcodone-acetaminophen (NORCO/VICODIN) 5-325 MG tablet Take 1 tablet by mouth every 6 (six) hours as needed for moderate pain. Take 1 tab every 6 hours as needed for pain.  . hydrocortisone (ANUSOL-HC) 25 MG suppository Insert one rectally 3 times a week  . loratadine (CLARITIN) 10 MG tablet Take 10 mg by mouth daily as needed for allergies.  Marland Kitchen mercaptopurine (PURINETHOL) 50 MG tablet Take 1 tablet (50 mg total) by mouth daily. Give on an empty stomach 1 hour before or 2 hours after meals. Caution: Chemotherapy.  . montelukast (SINGULAIR) 10 MG tablet Take 10 mg by mouth at bedtime.  . mupirocin ointment (BACTROBAN) 2 % Apply topically as directed.  . ondansetron (ZOFRAN) 4 MG tablet Take 4 mg by mouth every 8 (eight) hours as needed.    . polyethylene glycol powder (GLYCOLAX/MIRALAX) powder Reported on 10/01/2015  . Probiotic Product (VSL#3) CAPS Take 1 capsule by mouth 2 (two) times daily.  . promethazine (PHENERGAN) 25  MG tablet Take 1 tablet (25 mg total) by mouth every 6 (six) hours as needed.  . rosuvastatin (CRESTOR) 10 MG tablet Take 10 mg by mouth daily.    . sucralfate (CARAFATE) 1 g tablet Take 1 tablet (1 g total) by mouth 2 (two) times daily.  Marland Kitchen sulfaSALAzine (AZULFIDINE) 500 MG tablet Take 2 tablets (1,000 mg total) by mouth 2 (two) times daily.  . vedolizumab (ENTYVIO) 300 MG injection Inject into the vein every 8 (eight) weeks.  . vitamin B-12 (CYANOCOBALAMIN) 1000 MCG tablet Take 2 tablets by mouth once daily   . venlafaxine XR  (EFFEXOR-XR) 150 MG 24 hr capsule Take 150 mg by mouth.  . [DISCONTINUED] desvenlafaxine (PRISTIQ) 100 MG 24 hr tablet Take 100 mg by mouth daily.  . [DISCONTINUED] fluticasone-salmeterol (ADVAIR HFA) 115-21 MCG/ACT inhaler Inhale 2 puffs into the lungs 2 (two) times daily.  . [DISCONTINUED] mesalamine (CANASA) 1000 MG suppository Place 1 suppository (1,000 mg total) rectally at bedtime.  . [DISCONTINUED] Probiotic Product (ALIGN) 4 MG CAPS Take 1 capsule by mouth daily.   Facility-Administered Encounter Medications as of 02/18/2018  Medication  . 0.9 %  sodium chloride infusion    Allergies as of 02/18/2018 - Review Complete 02/18/2018  Allergen Reaction Noted  . Molds & smuts  02/09/2013  . Remicade [infliximab] Other (See Comments) 08/26/2006    Past Medical History:  Diagnosis Date  . Anxiety   . 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 Asthma   . B12 deficiency   . Chronic gastritis   . GERD (gastroesophageal reflux disease)   . Hyperlipidemia   . IBS (irritable bowel syndrome)   . Lupus ([place])    secondary to Rmicade  . Pancreatitis   . Rectal polyp 10/01/2015  . Ulcerative colitis     Past Surgical History:  Procedure Laterality Date  . BREAST CYST ASPIRATION     left  . ESOPHAGUS SURGERY    . SINUS SURGERY WITH INSTATRAK      Family History  Problem Relation Age of Onset  . Irritable bowel syndrome Father   . COPD Father   . Diabetes Maternal Grandfather   . Colon cancer Neg Hx   . Stomach cancer Neg Hx     Social History   Socioeconomic History  . Marital status: Married    Spouse name: Not on file  . Number of children: 0  . Years of education: Not on file  . Highest education level: Not on file  Occupational History  . Occupation: FLIGHT ATTENDANT  Social Needs  . Financial resource strain: Not on file  . Food insecurity:    Worry: Not on file    Inability: Not on file  . Transportation needs:    Medical: Not on file    Non-medical: Not on  file  Tobacco Use  . Smoking status: Never Smoker  . Smokeless tobacco: Never Used  Substance and Sexual Activity  . Alcohol use: No    Alcohol/week: 0.0 oz  . Drug use: No  . Sexual activity: Yes    Partners: Male    Birth control/protection: Pill  Lifestyle  . Physical activity:    Days per week: Not on file    Minutes per session: Not on file  . Stress: Not on file  Relationships  . Social connections:    Talks on phone: Not on file    Gets together: Not on file    Attends religious service: Not on file  Active member of club or organization: Not on file    Attends meetings of clubs or organizations: Not on file    Relationship status: Not on file  . Intimate partner violence:    Fear of current or ex partner: Not on file    Emotionally abused: Not on file    Physically abused: Not on file    Forced sexual activity: Not on file  Other Topics Concern  . Not on file  Social History Narrative  . Not on file      Review of systems: Review of Systems  Constitutional: Negative for fever and chills.  HENT: Negative.   Eyes: Negative for blurred vision.  Respiratory: Negative for cough, shortness of breath and wheezing.   Cardiovascular: Negative for chest pain and palpitations.  Gastrointestinal: as per HPI Genitourinary: Negative for dysuria, urgency, frequency and hematuria.  Musculoskeletal: Negative for myalgias, back pain and joint pain.  Skin: Negative for itching and rash.  Neurological: Negative for dizziness, tremors, focal weakness, seizures and loss of consciousness.  Endo/Heme/Allergies: Positive for seasonal allergies.  Psychiatric/Behavioral: Negative for depression, suicidal ideas and hallucinations.  All other systems reviewed and are negative.   Physical Exam: Vitals:   02/18/18 0840  BP: 124/90  Pulse: 64   Body mass index is 36.58 kg/m. Gen:      No acute distress HEENT:  EOMI, sclera anicteric Neck:     No masses; no  thyromegaly Lungs:    Clear to auscultation bilaterally; normal respiratory effort CV:         Regular rate and rhythm; no murmurs Abd:      + bowel sounds; soft, non-tender; no palpable masses, no distension Ext:    No edema; adequate peripheral perfusion Skin:      Warm and dry; no rash Neuro: alert and oriented x 3 Psych: normal mood and affect  Data Reviewed:  Reviewed labs, radiology imaging, old records and pertinent past GI work up  Colonoscopy December 25, 2016 showed active inflammation in the rectosigmoid area and a 12 mm polyp (sessile serrated adenoma) was removed from ascending colon  Assessment and Plan/Recommendations:  57 year old female with history of ulcerative colitis, allergic reaction to Remicade, loss of clinical response to Humira with elevated antibody level, failed Stelara and patient currently has missed multiple doses of Entyvio. Patient states she has higher out-of-pocket expense with Entyvio infusion and expressed interest in starting Xeljanz instead, agreed to be compliant with treatment regimen Continue 6-MP and sulfasalazine for now We will discontinue Entyvio once we obtain insurance approval to start  Morrie Sheldon, she is willing to continue the infusion until then. Follow-up CRP, CBC, CMP, B12, folate and ferritin  Refer to pulmonary for management of asthma given history of frequent exacerbation requiring multiple doses of oral steroids in the past 6 months. It is not very clear and patient is not forthcoming if she has been using steroids for control of UC symptoms  40 minutes was spent face-to-face with the patient. Greater than 50% of the time used for counseling as well as treatment plan and follow-up. She had multiple questions which were answered to her satisfaction  K. Denzil Magnuson , MD [phone]    CC: Manfred Shirts, PA

## 2018-02-20 LAB — QUANTIFERON-TB GOLD PLUS
NIL: 0.02 IU/mL
QuantiFERON-TB Gold Plus: NEGATIVE
TB1-NIL: 0.02 IU/mL
TB2-NIL: 0.01 [IU]/mL

## 2018-02-22 ENCOUNTER — Other Ambulatory Visit: Payer: Self-pay

## 2018-02-22 ENCOUNTER — Telehealth: Payer: Self-pay

## 2018-02-22 NOTE — Telephone Encounter (Signed)
Read recent OV note, awaiting approval for Kaitlyn Jennings, does patient need to have loading doses of Entyvio since she missed last few infusions?

## 2018-02-22 NOTE — Telephone Encounter (Signed)
Prior Auth submitted in CoverMyMeds for Xeljanz 10 mg, BID   Key: [id number] - PA Case ID: [id number] - Rx #: [id number]. May take up to 72 hours for decision

## 2018-02-22 NOTE — Telephone Encounter (Signed)
Did patient fill the prescription for Morrie Sheldon?  It was sent to pharmacy, please check if it has already been approved if so she would not need to stay on Entyvio but if not she can do the maintenance dose without reinduction.

## 2018-02-23 NOTE — Telephone Encounter (Signed)
Thank you :)

## 2018-02-23 NOTE — Telephone Encounter (Signed)
Ok thanks 

## 2018-02-23 NOTE — Telephone Encounter (Signed)
PA was sent to the insurance company 02/22/18.  Spoke with Rosann Auerbach at [facility]. Informed of the plan. I will call her asap with the plan.

## 2018-02-24 ENCOUNTER — Telehealth: Payer: Self-pay | Admitting: Gastroenterology

## 2018-02-24 MED ORDER — TOFACITINIB CITRATE 10 MG PO TABS: 1.0000 | ORAL_TABLET | Freq: Two times a day (BID) | ORAL | 1 refills | Status: DC

## 2018-02-24 NOTE — Addendum Note (Signed)
Addended by: Oda Kilts on: 02/24/2018 02:12 PM   Modules accepted: Orders

## 2018-02-24 NOTE — Telephone Encounter (Signed)
Kaitlyn Jennings was denied by her insurance. She will go for the Entyvio infusion this week as scheduled.

## 2018-02-24 NOTE — Telephone Encounter (Signed)
Spoke with the patient. She will go to the [facility] infusion this Friday. She knows the insurance denied Morrie Sheldon.

## 2018-03-01 NOTE — Telephone Encounter (Signed)
ok 

## 2018-03-01 NOTE — Telephone Encounter (Signed)
Kaitlyn Jennings was denied. Patient received a letter of this. She will go for the Entyvio infusion.

## 2018-03-10 ENCOUNTER — Institutional Professional Consult (permissible substitution): Payer: Medicare Other | Admitting: Internal Medicine

## 2018-04-02 ENCOUNTER — Ambulatory Visit: Payer: Medicare Other | Admitting: Gastroenterology

## 2018-04-05 ENCOUNTER — Ambulatory Visit (INDEPENDENT_AMBULATORY_CARE_PROVIDER_SITE_OTHER)
Admission: RE | Admit: 2018-04-05 | Discharge: 2018-04-05 | Disposition: A | Discharge status: Home / Self Care | Payer: Medicare Other | Source: Ambulatory Visit | Attending: Internal Medicine | Admitting: Internal Medicine

## 2018-04-05 ENCOUNTER — Ambulatory Visit: Payer: Medicare Other | Admitting: Internal Medicine

## 2018-04-05 ENCOUNTER — Encounter: Payer: Self-pay | Admitting: Internal Medicine

## 2018-04-05 VITALS — BP 118/72 | HR 104 | Ht 68.5 in | Wt 235.8 lb

## 2018-04-05 DIAGNOSIS — R059 Cough, unspecified: Secondary | ICD-10-CM

## 2018-04-05 DIAGNOSIS — K5190 Ulcerative colitis, unspecified, without complications: Secondary | ICD-10-CM

## 2018-04-05 DIAGNOSIS — R05 Cough: Secondary | ICD-10-CM

## 2018-04-05 DIAGNOSIS — R058 Other specified cough: Secondary | ICD-10-CM

## 2018-04-05 MED ORDER — HYDROCODONE-ACETAMINOPHEN 5-325 MG PO TABS: 1.0000 | ORAL_TABLET | ORAL | 0 refills | Status: DC | PRN

## 2018-04-05 MED ORDER — MOMETASONE FURO-FORMOTEROL FUM 100-5 MCG/ACT IN AERO: INHALATION_SPRAY | RESPIRATORY_TRACT | 11 refills | Status: DC

## 2018-04-05 NOTE — Assessment & Plan Note (Addendum)
allergy eval around 2014 ? Where > "pos mold only"  Sinus CT ordered  Clinically this is either refractory asthma (unlikely in absence of true wheeze while symptomatic) or more likely Upper airway cough syndrome (previously labeled PNDS),  is so named because it's frequently impossible to sort out how much is  CR/sinusitis with freq throat clearing (which can be related to primary GERD)   vs  causing  secondary (" extra esophageal")  GERD from wide swings in gastric pressure that occur with throat clearing, often  promoting self use of mint and menthol lozenges that reduce the lower esophageal sphincter tone and exacerbate the problem further in a cyclical fashion.   These are the same pts (now being labeled as having "irritable larynx syndrome" by some cough centers) who not infrequently have a history of having failed to tolerate ace inhibitors,  dry powder inhalers esp advair which she failed to tolerate (and sometimes high dose hfa ics like dulera 200  or biphosphonates or report having atypical/extraesophageal reflux symptoms that don't respond to standard doses of PPI  and are easily confused as having aecopd or asthma flares by even experienced allergists/ pulmonologists (myself included).    Of the three most common causes of  Sub-acute / recurrent or chronic cough, only one (GERD)  can actually contribute to/ trigger  the other two (asthma and post nasal drip syndrome)  and perpetuate the cylce of cough.  While not intuitively obvious, many patients with chronic low grade reflux do not cough until there is a primary insult that disturbs the protective epithelial barrier and exposes sensitive nerve endings.   This is typically viral but can due to PNDS and  either may apply here.     The point is that once this occurs, it is difficult to eliminate the cycle  using anything but a maximally effective acid suppression regimen at least in the short run, accompanied by an appropriate diet to address  non acid GERD and control / eliminate the cough itself for at least 3 days with vicodin if needed and also try lower strength of dulera since the higher doses can aggravate uacs and not sure she really has asthma at this point).    Will regroup in 4-6 weeks with spirometry on the lower dose of dulera to sort out    - The proper method of use, as well as anticipated side effects, of a metered-dose inhaler are discussed and demonstrated to the patient.

## 2018-04-05 NOTE — Patient Instructions (Addendum)
Take delsym two tsp every 12 hours and supplement if needed with  Vicodin  up to 1 every 4 hours to suppress the urge to cough. Swallowing water and/or using ice chips/non mint and menthol containing candies (such as lifesavers or sugarless jolly ranchers) are also effective.  You should rest your voice and avoid activities that you know make you cough.  Once you have eliminated the cough for 3 straight days try reducing the vicodin first,  then the delsym as tolerated.    Nexium 40 mg  Take 30- 60 min before your first and last meals of the day   GERD (REFLUX)  is an extremely common cause of respiratory symptoms just like yours , many times with no obvious heartburn at all.    It can be treated with medication, but also with lifestyle changes including elevation of the head of your bed (ideally with 6 inch  bed blocks),  Smoking cessation, avoidance of late meals, excessive alcohol, and avoid fatty foods, chocolate, peppermint, colas, red wine, and acidic juices such as orange juice.  NO MINT OR MENTHOL PRODUCTS SO NO COUGH DROPS   USE SUGARLESS CANDY INSTEAD (Jolley ranchers or Stover's or Life Savers) or even ice chips will also do - the key is to swallow to prevent all throat clearing. NO OIL BASED VITAMINS - use powdered substitutes.    Dulera 100 Take 2 puffs first thing in am and then another 2 puffs about 12 hours later.   Work on inhaler technique:  relax and gently blow all the way out then take a nice smooth deep breath back in, triggering the inhaler at same time you start breathing in.  Hold for up to 5 seconds if you can. Blow out thru nose. Rinse and gargle with water when done   Only use your albuterol as a rescue medication to be used if you can't catch your breath by resting or doing a relaxed purse lip breathing pattern.  - The less you use it, the better it will work when you need it. - Ok to use up to 2 puffs  every 4 hours if you must but call for immediate appointment if  use goes up over your usual need - Don't leave home without it !!  (think of it like the spare tire for your car)   Please see patient coordinator before you leave today  to schedule sinus CT    Please remember to go to the  x-ray department downstairs in the basement  for your tests - we will call you with the results when they are available.       Please schedule a follow up office visit in 4 weeks, sooner if needed  with all medications /inhalers/ solutions in hand so we can verify exactly what you are taking. This includes all medications from all doctors and over the counters - Full PFT's when return

## 2018-04-05 NOTE — Progress Notes (Signed)
Kaitlyn Jennings, female    DOB: 03/05/1961,   MRN: [id number]   Brief patient profile:  64 yowf never smoker with UC since 1988 grew up in [place] with "lifelong"  seasonal rhinitis /cough mostly in spring worse over the years complicated by chronic sinusitis > surgery around 1991 helped  then around 2005 dx asthma in [place] better on inhalers/ prednisone initially resolved x months at a time then around 2013 placed daily adair > eventually worse so changed to dulera 200 ? When  with still freq spells and need for prednisone plus new rattling cough x winter 2019 of referred to pulmonary clinic 04/05/2018 by Dr  Silverio Decamp.    Allergy eval around 2014 Pos mold only / does not remember what allergist   History of Present Illness   04/05/2018  1st pulmonary eval / Wert  Chief Complaint  Patient presents with  . Consult    Referred by Dr. Silverio Decamp due to pt having c/o hoarseness, wheezing, productive cough but is unable to get any mucus up, and also SOB that has been happening x6 months not and is not getting any better.  Dyspnea:  MMRC3 = can't walk 100 yards even at a slow pace at a flat grade s stopping due to sob  Can do HT but it's a struggle  Cough: early in am > no production  SABA use: hfa 2 x daily   Nasal symptoms worse x 6 months  Prednisone always helps the most  With both nasal and chest symptoms / gi symptoms much better under present rx by our GI div.   No other obvious day to day or daytime variability or assoc  Production excess/ purulent sputum or mucus plugs or hemoptysis or cp or chest tightness,   or overt sinus or hb symptoms.   Sleep: flat x one pillow / sometimes nose clogged wakes her.     denies any obvious fluctuation of symptoms with weather or environmental changes or other aggravating or alleviating factors except as outlined above   No unusual exposure hx or h/o childhood pna/ asthma or knowledge of premature birth.  Current Allergies, Complete Past Medical  History, Past Surgical History, Family History, and Social History were reviewed in [employer] record.  ROS  The following are not active complaints unless bolded Hoarseness, sore throat, dysphagia, dental problems, itching, sneezing,  nasal congestion or discharge of excess mucus or purulent secretions, ear ache,   fever, chills, sweats, unintended wt loss or wt gain, classically pleuritic or exertional cp,  orthopnea pnd or arm/hand swelling  or leg swelling, presyncope, palpitations, abdominal pain, anorexia, nausea, vomiting, diarrhea  or change in bowel habits or change in bladder habits, change in stools or change in urine, dysuria, hematuria,  rash, arthralgias, visual complaints, headache, numbness, weakness or ataxia or problems with walking or coordination,  change in mood or  memory.                  Past Medical History:  Diagnosis Date  . Anxiety   . 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 Asthma   . B12 deficiency   . Chronic gastritis   . GERD (gastroesophageal reflux disease)   . Hyperlipidemia   . IBS (irritable bowel syndrome)   . 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 Pancreatitis   . Rectal polyp 10/01/2015  . Ulcerative colitis     Outpatient Medications Prior to Visit  Medication  Sig Dispense Refill  . albuterol (PROVENTIL) (2.5 MG/3ML) 0.083% nebulizer solution Take 2.5 mg by nebulization as needed.      Marland Kitchen buPROPion (WELLBUTRIN XL) 150 MG 24 hr tablet Take 1 tablet by mouth daily.    . Calcium Carbonate-Vitamin D (CALCIUM 500 + D PO) Take 1 capsule by mouth daily.      . Cholecalciferol (VITAMIN D) 1000 UNITS capsule Take 1,000 Units by mouth daily.      Marland Kitchen dicyclomine (BENTYL) 20 MG tablet TAKE 1 TABLET BY MOUTH 4 TIMES DAILY WITH MEALS AND  AT BEDTIME 120 tablet 3  . DULERA 200-5 MCG/ACT AERO INHALE 2 PUFFS BY MOUTH TWICE DAILY (GARGLE AND RINSE)  11  . esomeprazole (NEXIUM) 40 MG capsule Take 1 capsule (40 mg total) by mouth 2  (two) times daily before a meal. 180 capsule 3  . ferrous sulfate 325 (65 FE) MG tablet Take 325 mg by mouth daily with breakfast.      . fluticasone (FLONASE) 50 MCG/ACT nasal spray Place 2 sprays into both nostrils daily.    . folic acid (FOLVITE) 1 MG tablet Take 1 tablet (1 mg total) by mouth daily. 90 tablet 3  . HYDROcodone-acetaminophen (NORCO/VICODIN) 5-325 MG tablet Take 1 tablet by mouth every 6 (six) hours as needed for moderate pain. Take 1 tab every 6 hours as needed for pain. 50 tablet 0  . loratadine (CLARITIN) 10 MG tablet Take 10 mg by mouth daily as needed for allergies.    Marland Kitchen mercaptopurine (PURINETHOL) 50 MG tablet Take 1 tablet (50 mg total) by mouth daily. Give on an empty stomach 1 hour before or 2 hours after meals. Caution: Chemotherapy. 90 tablet 3  . montelukast (SINGULAIR) 10 MG tablet Take 10 mg by mouth at bedtime.    . mupirocin ointment (BACTROBAN) 2 % Apply topically as directed.    . ondansetron (ZOFRAN) 4 MG tablet Take 4 mg by mouth every 8 (eight) hours as needed for nausea.     . polyethylene glycol powder (GLYCOLAX/MIRALAX) powder Reported on 10/01/2015    . Probiotic Product (VSL#3) CAPS Take 1 capsule by mouth 2 (two) times daily. 8 capsule 0  . rosuvastatin (CRESTOR) 10 MG tablet Take 10 mg by mouth daily.      . sucralfate (CARAFATE) 1 g tablet Take 1 tablet (1 g total) by mouth 2 (two) times daily. 180 tablet 3  . sulfaSALAzine (AZULFIDINE) 500 MG tablet Take 2 tablets (1,000 mg total) by mouth 2 (two) times daily. 360 tablet 3  . vedolizumab (ENTYVIO) 300 MG injection Inject into the vein every 8 (eight) weeks.    Marland Kitchen venlafaxine XR (EFFEXOR-XR) 150 MG 24 hr capsule Take by mouth.    . vitamin B-12 (CYANOCOBALAMIN) 1000 MCG tablet Take 2 tablets by mouth once daily     . Vitamin D, Ergocalciferol, (DRISDOL) 50000 units CAPS capsule Take 50,000 Units by mouth once a week.  5  . hydrocortisone (ANUSOL-HC) 25 MG suppository Insert one rectally 3 times a week  (Patient not taking: Reported on 04/05/2018) 30 suppository 3  . promethazine (PHENERGAN) 25 MG tablet Take 1 tablet (25 mg total) by mouth every 6 (six) hours as needed. (Patient not taking: Reported on 04/05/2018) 30 tablet 2  . venlafaxine XR (EFFEXOR-XR) 150 MG 24 hr capsule Take 150 mg by mouth.                Objective:     BP 118/72 (BP Location: Left Arm,  Cuff Size: Normal)   Pulse (!) 104   Ht 5' 8.5" (1.74 m)   Wt 235 lb 12.8 oz (107 kg)   LMP 11/12/2016 (Approximate)   SpO2 96%   BMI 35.33 kg/m   SpO2: 96 %  RA  Wt Readings from Last 3 Encounters:  04/05/18 235 lb 12.8 oz (107 kg)  02/18/18 240 lb 9.6 oz (109.1 kg)  10/19/17 243 lb (110.2 kg)        amb pleasant wf raspy voice/ obvious pseudowheeze  HEENT: nl dentition, turbinates bilaterally, and oropharynx. Nl external ear canals without cough reflex   NECK :  without JVD/Nodes/TM/ nl carotid upstrokes bilaterally   LUNGS: no acc muscle use,  Nl contour chest which is clear to A and P bilaterally without cough on insp or exp maneuvers   CV:  RRR  no s3 or murmur or increase in P2, and no edema   ABD:  soft and nontender with nl inspiratory excursion in the supine position. No bruits or organomegaly appreciated, bowel sounds nl  MS:  Nl gait/ ext warm without deformities, calf tenderness, cyanosis or clubbing No obvious joint restrictions   SKIN: warm and dry without lesions    NEURO:  alert, approp, nl sensorium with  no motor or cerebellar deficits apparent.         CXR PA and Lateral:   04/05/2018 :    I personally reviewed images and agree with radiology impression as follows:   No active cardiopulmonary disease.       Assessment   Upper airway cough syndrome allergy eval around 2014 ? Where > "pos mold only"  Sinus CT ordered  Clinically this is either refractory asthma (unlikely in absence of true wheeze while symptomatic) or more likely Upper airway cough syndrome (previously  labeled PNDS),  is so named because it's frequently impossible to sort out how much is  CR/sinusitis with freq throat clearing (which can be related to primary GERD)   vs  causing  secondary (" extra esophageal")  GERD from wide swings in gastric pressure that occur with throat clearing, often  promoting self use of mint and menthol lozenges that reduce the lower esophageal sphincter tone and exacerbate the problem further in a cyclical fashion.   These are the same pts (now being labeled as having "irritable larynx syndrome" by some cough centers) who not infrequently have a history of having failed to tolerate ace inhibitors,  dry powder inhalers esp advair which she failed to tolerate (and sometimes high dose hfa ics like dulera 200  or biphosphonates or report having atypical/extraesophageal reflux symptoms that don't respond to standard doses of PPI  and are easily confused as having aecopd or asthma flares by even experienced allergists/ pulmonologists (myself included).    Of the three most common causes of  Sub-acute / recurrent or chronic cough, only one (GERD)  can actually contribute to/ trigger  the other two (asthma and post nasal drip syndrome)  and perpetuate the cylce of cough.  While not intuitively obvious, many patients with chronic low grade reflux do not cough until there is a primary insult that disturbs the protective epithelial barrier and exposes sensitive nerve endings.   This is typically viral but can due to PNDS and  either may apply here.     The point is that once this occurs, it is difficult to eliminate the cycle  using anything but a maximally effective acid suppression regimen at least in the short  run, accompanied by an appropriate diet to address non acid GERD and control / eliminate the cough itself for at least 3 days with vicodin if needed and also try lower strength of dulera since the higher doses can aggravate uacs and not sure she really has asthma at this  point).    Will regroup in 4-6 weeks with spirometry on the lower dose of dulera to sort out    - The proper method of use, as well as anticipated side effects, of a metered-dose inhaler are discussed and demonstrated to the patient.        Ulcerative colitis ([place]) Advised pt that UC can involve the resp tract and cause conditions like bronchiectasis but note her resp symptoms have worsened while her GI symptoms have improved so I think this is unlikely       Total time devoted to counseling  > 50 % of initial 60 min office visit:  review case with pt/ discussion of options/alternatives/ personally creating written customized instructions  in presence of pt  then going over those specific  Instructions directly with the pt including how to use all of the meds but in particular covering each new medication in detail and the difference between the maintenance= "automatic" meds and the prns using an action plan format for the latter (If this problem/symptom => do that organization reading Left to right).  Please see AVS from this visit for a full list of these instructions which I personally wrote for this pt and  are unique to this visit.   See device teaching which extended face to face time for this visit      Christinia Gully, MD 04/05/2018

## 2018-04-05 NOTE — Progress Notes (Signed)
Spoke with pt and notified of results per Dr. Wert. Pt verbalized understanding and denied any questions. 

## 2018-04-06 ENCOUNTER — Encounter: Payer: Self-pay | Admitting: Internal Medicine

## 2018-04-06 ENCOUNTER — Telehealth: Payer: Self-pay | Admitting: Internal Medicine

## 2018-04-06 NOTE — Assessment & Plan Note (Signed)
Advised pt that UC can involve the resp tract and cause conditions like bronchiectasis but note her resp symptoms have worsened while her GI symptoms have improved so I think this is unlikely       Total time devoted to counseling  > 50 % of initial 60 min office visit:  review case with pt/ discussion of options/alternatives/ personally creating written customized instructions  in presence of pt  then going over those specific  Instructions directly with the pt including how to use all of the meds but in particular covering each new medication in detail and the difference between the maintenance= "automatic" meds and the prns using an action plan format for the latter (If this problem/symptom => do that organization reading Left to right).  Please see AVS from this visit for a full list of these instructions which I personally wrote for this pt and  are unique to this visit.   See device teaching which extended face to face time for this visit

## 2018-04-06 NOTE — Telephone Encounter (Signed)
Received a PA request from [place] for patient's Dulera 141mcg. PA was started via [email or web address] Key is [id number]. Per CMM, a determination will be made within 72 hours. Will keep this encounter open until a determination has been made.

## 2018-04-07 NOTE — Telephone Encounter (Signed)
Medication has been denied. Insurance will not cover the medication unless the patient has tried and failed Flonase or azelastine. Sent an appeal back stating that the patient has tried and failed Flonase. Will await [facility]'s response.

## 2018-04-08 NOTE — Telephone Encounter (Signed)
Attempted to call pt. I did not receive an answer. I have left a message for pt to return our call.  

## 2018-04-08 NOTE — Telephone Encounter (Signed)
Should be free on symbicort 80 2bid with a zero coupon sitting on my desk so let's switch to that

## 2018-04-08 NOTE — Telephone Encounter (Signed)
CMM- N/Aon July 30 This medication is on your plan's list of covered drugs. Prior authorization is not required at this time. If your pharmacy has questions regarding the processing of your prescription, please have them call the [facility] pharmacy help desk at [phone]. For additional information, the member can contact Member Services by calling the number on the back of their ID Card. I have [place] and they said medictation is ready for pick up the cost is $95.  Dr. Melvyn Novas please advise if you would like to change medication due to cost.

## 2018-04-08 NOTE — Telephone Encounter (Signed)
Disregard message in regards to flonase nasal spray.

## 2018-04-14 ENCOUNTER — Inpatient Hospital Stay: Admission: RE | Admit: 2018-04-14 | Payer: Medicare Other | Source: Ambulatory Visit

## 2018-04-22 ENCOUNTER — Inpatient Hospital Stay: Admission: RE | Admit: 2018-04-22 | Payer: Medicare Other | Source: Ambulatory Visit

## 2018-04-27 ENCOUNTER — Ambulatory Visit: Payer: Medicare Other | Admitting: Physician Assistant

## 2018-05-03 ENCOUNTER — Ambulatory Visit (INDEPENDENT_AMBULATORY_CARE_PROVIDER_SITE_OTHER)
Admission: RE | Admit: 2018-05-03 | Discharge: 2018-05-03 | Disposition: A | Discharge status: Home / Self Care | Payer: Medicare Other | Source: Ambulatory Visit | Attending: Internal Medicine | Admitting: Internal Medicine

## 2018-05-03 DIAGNOSIS — R05 Cough: Secondary | ICD-10-CM

## 2018-05-03 DIAGNOSIS — R059 Cough, unspecified: Secondary | ICD-10-CM

## 2018-05-03 DIAGNOSIS — R058 Other specified cough: Secondary | ICD-10-CM

## 2018-05-07 ENCOUNTER — Ambulatory Visit: Payer: Medicare Other | Admitting: Internal Medicine

## 2018-06-04 ENCOUNTER — Ambulatory Visit: Payer: Medicare Other | Admitting: Internal Medicine

## 2018-06-21 ENCOUNTER — Ambulatory Visit: Payer: Medicare Other | Admitting: Internal Medicine

## 2018-07-01 ENCOUNTER — Ambulatory Visit: Payer: Medicare Other | Admitting: Gastroenterology

## 2018-07-21 ENCOUNTER — Other Ambulatory Visit: Payer: Self-pay | Admitting: Gastroenterology

## 2018-07-21 ENCOUNTER — Telehealth: Payer: Self-pay | Admitting: Gastroenterology

## 2018-07-21 NOTE — Telephone Encounter (Signed)
Ok

## 2018-07-21 NOTE — Telephone Encounter (Signed)
Rosann Auerbach from [facility] calling to notify Dr.Nandigam that this pt has canceled her entyvio infusions several times and has not had an infusion since June. For more information Rosann Auerbach contact is [phone] ext:[phone]

## 2018-07-21 NOTE — Telephone Encounter (Signed)
She has a follow up appointment on 08/11/18 with you.

## 2018-07-29 ENCOUNTER — Telehealth: Payer: Self-pay | Admitting: Gastroenterology

## 2018-07-29 NOTE — Telephone Encounter (Signed)
I have spoken with [name]. She is very tearful. It has a bad year for her with the death of her mother and her aunt. A letter was received from [facility]. They have dismissed her from the infusion center due to excessive cancellations. (I confirmed this with [facility] 8 times this year) She is advised of how long it has been since she had an Entyvio infusion. She states she was not aware it had been that long. (June of this year) Tomorrow is the 1 year mark of the death of her mother. [birth year] the patient's [birth year].  The importance of keeping the upcoming appointment with Dr Silverio Decamp is stressed.

## 2018-08-09 ENCOUNTER — Ambulatory Visit: Payer: Medicare Other | Admitting: Gastroenterology

## 2018-08-11 ENCOUNTER — Encounter

## 2018-08-11 ENCOUNTER — Encounter: Payer: Self-pay | Admitting: Gastroenterology

## 2018-08-11 ENCOUNTER — Other Ambulatory Visit (INDEPENDENT_AMBULATORY_CARE_PROVIDER_SITE_OTHER): Payer: Medicare Other

## 2018-08-11 ENCOUNTER — Ambulatory Visit: Payer: Medicare Other | Admitting: Gastroenterology

## 2018-08-11 VITALS — BP 136/84 | HR 95 | Ht 68.5 in | Wt 248.0 lb

## 2018-08-11 DIAGNOSIS — M25541 Pain in joints of right hand: Secondary | ICD-10-CM

## 2018-08-11 DIAGNOSIS — K5180 Other ulcerative colitis without complications: Secondary | ICD-10-CM

## 2018-08-11 DIAGNOSIS — M25542 Pain in joints of left hand: Secondary | ICD-10-CM

## 2018-08-11 LAB — CBC WITH DIFFERENTIAL/PLATELET
Basophils Absolute: 0 10*3/uL (ref 0.0–0.1)
Basophils Relative: 0.5 % (ref 0.0–3.0)
Eosinophils Absolute: 0.4 10*3/uL (ref 0.0–0.7)
Eosinophils Relative: 4.7 % (ref 0.0–5.0)
HCT: 35.7 % — ABNORMAL LOW (ref 36.0–46.0)
Hemoglobin: 11.7 g/dL — ABNORMAL LOW (ref 12.0–15.0)
LYMPHS ABS: 1.6 10*3/uL (ref 0.7–4.0)
Lymphocytes Relative: 21.3 % (ref 12.0–46.0)
MCHC: 32.8 g/dL (ref 30.0–36.0)
MCV: 84.7 fl (ref 78.0–100.0)
Monocytes Absolute: 0.6 10*3/uL (ref 0.1–1.0)
Monocytes Relative: 7.8 % (ref 3.0–12.0)
NEUTROS ABS: 5 10*3/uL (ref 1.4–7.7)
Neutrophils Relative %: 65.7 % (ref 43.0–77.0)
Platelets: 353 10*3/uL (ref 150.0–400.0)
RBC: 4.21 Mil/uL (ref 3.87–5.11)
RDW: 15.3 % (ref 11.5–15.5)
WBC: 7.6 10*3/uL (ref 4.0–10.5)

## 2018-08-11 LAB — COMPREHENSIVE METABOLIC PANEL
ALK PHOS: 85 U/L (ref 39–117)
ALT: 18 U/L (ref 0–35)
AST: 14 U/L (ref 0–37)
Albumin: 4.3 g/dL (ref 3.5–5.2)
BILIRUBIN TOTAL: 0.3 mg/dL (ref 0.2–1.2)
BUN: 11 mg/dL (ref 6–23)
CO2: 27 meq/L (ref 19–32)
Calcium: 9.6 mg/dL (ref 8.4–10.5)
Chloride: 104 mEq/L (ref 96–112)
Creatinine, Ser: 0.79 mg/dL (ref 0.40–1.20)
GFR: 79.44 mL/min (ref >60.00)
Glucose, Bld: 130 mg/dL — ABNORMAL HIGH (ref 70–99)
Potassium: 3.3 mEq/L — ABNORMAL LOW (ref 3.5–5.1)
Sodium: 141 mEq/L (ref 135–145)
Total Protein: 7 g/dL (ref 6.0–8.3)

## 2018-08-11 LAB — C-REACTIVE PROTEIN: CRP: 0.3 mg/dL — ABNORMAL LOW (ref 0.5–20.0)

## 2018-08-11 LAB — IBC PANEL
Iron: 71 ug/dL (ref 42–145)
Saturation Ratios: 21.3 % (ref 20.0–50.0)
Transferrin: 238 mg/dL (ref 212.0–360.0)

## 2018-08-11 LAB — VITAMIN B12: Vitamin B-12: 949 pg/mL — ABNORMAL HIGH (ref 211–911)

## 2018-08-11 LAB — FERRITIN: FERRITIN: 31.1 ng/mL (ref 10.0–291.0)

## 2018-08-11 LAB — FOLATE

## 2018-08-11 NOTE — Progress Notes (Signed)
Kaitlyn Jennings    [id number]    Jun 26, 1961  Primary Care Physician:Beane, Mirian Mo, PA  Referring Physician: Manfred Shirts, [address] [place], [place] [address]  Chief complaint: Ulcerative colitis  HPI: 57 year old female with history of ulcerative colitis initially diagnosed in 1998 Allergic reaction to Remicade, was maintained on Humira with loss of clinical response 2016 (elevated antibody level with undetectable drug trough), switched to [facility] May 2017, was discontinued by patient due to lack of adequate insurance coverage with high out-of-pocket deductible.  Subsequently switched to [facility] in 2018, but she has missed multiple infusions and is getting the dose erratically. Last dose about 6 months ago. Morrie Sheldon had higher out-of-pocket deductible, patient does not want to start it. 2-3 formed BM per day, no rectal bleeding, no nausea, vomiting, or abd pain She is having severe back and small joints in hands  She had fever last week, with coughing and congested with upper resp symptoms.  Last prednisone dose for bronchitis was about 6 months ago.  She is having unintentional excessive weight gain.  Is unable to exercise due to severe back pain.   Colonoscopy December 25, 2016: Moderately active proctitis, 12 mm polyp removed (sessile serrated adenoma)   Outpatient Encounter Medications as of 08/11/2018  Medication Sig  . albuterol (PROVENTIL) (2.5 MG/3ML) 0.083% nebulizer solution Take 2.5 mg by nebulization as needed.    Marland Kitchen buPROPion (WELLBUTRIN XL) 150 MG 24 hr tablet Take 1 tablet by mouth daily.  . Calcium Carbonate-Vitamin D (CALCIUM 500 + D PO) Take 1 capsule by mouth daily.    . Cholecalciferol (VITAMIN D) 1000 UNITS capsule Take 1,000 Units by mouth daily.    Marland Kitchen dicyclomine (BENTYL) 20 MG tablet TAKE 1 TABLET BY MOUTH 4 TIMES DAILY WITH MEALS AND  AT BEDTIME  . esomeprazole (NEXIUM) 40 MG capsule Take 1 capsule (40 mg total) by mouth 2 (two) times daily before a  meal.  . esomeprazole (NEXIUM) 40 MG capsule TAKE 1 CAPSULE BY MOUTH TWICE DAILY BEFORE A MEAL  . ferrous sulfate 325 (65 FE) MG tablet Take 325 mg by mouth daily with breakfast.    . fluticasone (FLONASE) 50 MCG/ACT nasal spray Place 2 sprays into both nostrils daily.  . folic acid (FOLVITE) 1 MG tablet TAKE 1 TABLET BY MOUTH ONCE DAILY  . HYDROcodone-acetaminophen (NORCO/VICODIN) 5-325 MG tablet Take 1 tablet by mouth every 4 (four) hours as needed for moderate pain (or cough). Take 1 tab every 6 hours as needed for pain.  . hydrocortisone (ANUSOL-HC) 25 MG suppository Insert one rectally 3 times a week (Patient not taking: Reported on 04/05/2018)  . loratadine (CLARITIN) 10 MG tablet Take 10 mg by mouth daily as needed for allergies.  Marland Kitchen mercaptopurine (PURINETHOL) 50 MG tablet TAKE 1 TABLET BY MOUTH DAILY ON AN EMPTY STOMACH ONE  HOUR  BEFORE  OR  2  HOURS  AFTER  MEALS  . mometasone-formoterol (DULERA) 100-5 MCG/ACT AERO Take 2 puffs first thing in am and then another 2 puffs about 12 hours later.  . montelukast (SINGULAIR) 10 MG tablet Take 10 mg by mouth at bedtime.  . mupirocin ointment (BACTROBAN) 2 % Apply topically as directed.  . ondansetron (ZOFRAN) 4 MG tablet Take 4 mg by mouth every 8 (eight) hours as needed for nausea.   . polyethylene glycol powder (GLYCOLAX/MIRALAX) powder Reported on 10/01/2015  . Probiotic Product (VSL#3) CAPS Take 1 capsule by  mouth 2 (two) times daily.  . promethazine (PHENERGAN) 25 MG tablet Take 1 tablet (25 mg total) by mouth every 6 (six) hours as needed. (Patient not taking: Reported on 04/05/2018)  . rosuvastatin (CRESTOR) 10 MG tablet Take 10 mg by mouth daily.    . sucralfate (CARAFATE) 1 g tablet TAKE 1 TABLET BY MOUTH TWICE DAILY  . sulfaSALAzine (AZULFIDINE) 500 MG tablet Take 2 tablets (1,000 mg total) by mouth 2 (two) times daily.  . vedolizumab (ENTYVIO) 300 MG injection Inject into the vein every 8 (eight) weeks.  Marland Kitchen venlafaxine XR (EFFEXOR-XR)  150 MG 24 hr capsule Take 150 mg by mouth.  . venlafaxine XR (EFFEXOR-XR) 150 MG 24 hr capsule Take by mouth.  . vitamin B-12 (CYANOCOBALAMIN) 1000 MCG tablet Take 2 tablets by mouth once daily   . Vitamin D, Ergocalciferol, (DRISDOL) 50000 units CAPS capsule Take 50,000 Units by mouth once a week.   No facility-administered encounter medications on file as of 08/11/2018.     Allergies as of 08/11/2018 - Review Complete 04/06/2018  Allergen Reaction Noted  . Molds & smuts  02/09/2013  . Remicade [infliximab] Other (See Comments) 08/26/2006    Past Medical History:  Diagnosis Date  . Anxiety   . 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 Asthma   . B12 deficiency   . Chronic gastritis   . GERD (gastroesophageal reflux disease)   . Hyperlipidemia   . IBS (irritable bowel syndrome)   . 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 Pancreatitis   . Rectal polyp 10/01/2015  . Ulcerative colitis     Past Surgical History:  Procedure Laterality Date  . BREAST CYST ASPIRATION     left  . ESOPHAGUS SURGERY    . SINUS SURGERY WITH INSTATRAK      Family History  Problem Relation Age of Onset  . Irritable bowel syndrome Father   . COPD Father   . Diabetes Maternal Grandfather   . Colon cancer Neg Hx   . Stomach cancer Neg Hx     Social History   Socioeconomic History  . Marital status: Married    Spouse name: Not on file  . Number of children: 0  . Years of education: Not on file  . Highest education level: Not on file  Occupational History  . Occupation: FLIGHT ATTENDANT  Social Needs  . Financial resource strain: Not on file  . Food insecurity:    Worry: Not on file    Inability: Not on file  . Transportation needs:    Medical: Not on file    Non-medical: Not on file  Tobacco Use  . Smoking status: Never Smoker  . Smokeless tobacco: Never Used  Substance and Sexual Activity  . Alcohol use: No    Alcohol/week: 0.0 standard drinks  . Drug use: No  . Sexual activity:  Yes    Partners: Male    Birth control/protection: Pill  Lifestyle  . Physical activity:    Days per week: Not on file    Minutes per session: Not on file  . Stress: Not on file  Relationships  . Social connections:    Talks on phone: Not on file    Gets together: Not on file    Attends religious service: Not on file    Active member of club or organization: Not on file    Attends meetings of clubs or organizations: Not on file    Relationship status: Not  on file  . Intimate partner violence:    Fear of current or ex partner: Not on file    Emotionally abused: Not on file    Physically abused: Not on file    Forced sexual activity: Not on file  Other Topics Concern  . Not on file  Social History Narrative  . Not on file      Review of systems: Review of Systems  Constitutional: Negative for fever and chills.  Positive for lack of energy HENT: Positive for sinus problem Eyes: Negative for blurred vision.  Respiratory: Positive for cough, shortness of breath and wheezing.   Cardiovascular: Negative for chest pain and palpitations.  Gastrointestinal: as per HPI Genitourinary: Negative for dysuria, urgency, frequency and hematuria.  Musculoskeletal: Positive for myalgias, back pain and joint pain.  Skin: Negative for itching and rash.  Neurological: Negative for dizziness, tremors, focal weakness, seizures and loss of consciousness.  Endo/Heme/Allergies: Positive for seasonal allergies.  Psychiatric/Behavioral: Negative for suicidal ideas and hallucinations.  Positive for depression, anxiety and insomnia All other systems reviewed and are negative.   Physical Exam: Vitals:   08/11/18 0845  BP: 136/84  Pulse: 95  SpO2: 99%   Body mass index is 37.16 kg/m. Gen:      No acute distress HEENT:  EOMI, sclera anicteric Neck:     No masses; no thyromegaly Lungs:    Clear to auscultation bilaterally; normal respiratory effort CV:         Regular rate and rhythm; no  murmurs Abd:      + bowel sounds; soft, non-tender; no palpable masses, no distension Ext:    No edema; adequate peripheral perfusion Skin:      Warm and dry; no rash Neuro: alert and oriented x 3 Psych: normal mood and affect  Data Reviewed:  Reviewed labs, radiology imaging, old records and pertinent past GI work up   Assessment and Plan/Recommendations: 57 year old female with history of ulcerative colitis initially diagnosed in 1998, currently in clinical remission.  Continue sulfasalazine and 6-MP Continue vitamin B12, folate, iron and multivitamins Will check CBC, CMP, CRP, B12, folate and iron panel DC Entyvio as patient has not received any infusions in the past 6 months Refer to rheumatologist Dr. Estanislado Pandy  for evaluation of small joint pain in both hands and exclude rheumatoid arthritis Return in 6 months or sooner if needed  25 minutes was spent face-to-face with the patient. Greater than 50% of the time used for counseling as well as treatment plan and follow-up. She had multiple questions which were answered to her satisfaction  K. Denzil Magnuson , MD [phone]    CC: Manfred Shirts, PA

## 2018-08-11 NOTE — Patient Instructions (Signed)
Go to the basement today for labs  We will refer you to Dr Patrecia Pour [facility]umatology and contact you with that appointment  Discontinue Entyvio   Follow up in 6 months  If you are age 58 or older, your body mass index should be between 23-30. Your Body mass index is 37.16 kg/m. If this is out of the aforementioned range listed, please consider follow up with your Primary Care Provider.  If you are age 31 or younger, your body mass index should be between 19-25. Your Body mass index is 37.16 kg/m. If this is out of the aformentioned range listed, please consider follow up with your Primary Care Provider.    Thank you for choosing [facility]  Karleen Hampshire Nandigam,MD

## 2018-08-23 ENCOUNTER — Encounter: Payer: Self-pay | Admitting: Gastroenterology

## 2018-09-23 NOTE — Progress Notes (Deleted)
Office Visit Note  Patient: Kaitlyn Jennings             Date of Birth: 1961-02-18           MRN: [id number]             PCP: Manfred Shirts, PA Referring: Mauri Pole, MD Visit Date: 10/05/2018 Occupation: @GUAROCC @  Subjective:  No chief complaint on file.   History of Present Illness: Kaitlyn Jennings is a 58 y.o. female ***   Activities of Daily Living:  Patient reports morning stiffness for *** {minute/hour:19697}.   Patient {ACTIONS;DENIES/REPORTS:21021675::"Denies"} nocturnal pain.  Difficulty dressing/grooming: {ACTIONS;DENIES/REPORTS:21021675::"Denies"} Difficulty climbing stairs: {ACTIONS;DENIES/REPORTS:21021675::"Denies"} Difficulty getting out of chair: {ACTIONS;DENIES/REPORTS:21021675::"Denies"} Difficulty using hands for taps, buttons, cutlery, and/or writing: {ACTIONS;DENIES/REPORTS:21021675::"Denies"}  No Rheumatology ROS completed.   PMFS History:  Patient Active Problem List   Diagnosis Date Noted  . Upper airway cough syndrome 04/05/2018  . B12 DEFICIENCY 03/26/2010  . GASTRITIS 02/04/2008  . ABDOMINAL PAIN, EPIGASTRIC 02/04/2008  . ANXIETY 09/25/2007  . Ulcerative colitis ([place]) 09/25/2007  . IRRITABLE BOWEL SYNDROME 09/25/2007  . ARTHRITIS 09/25/2007  . PANCREATITIS, ACUTE, HX OF 09/25/2007    Past Medical History:  Diagnosis Date  . Anxiety   . 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 Asthma   . B12 deficiency   . Chronic gastritis   . GERD (gastroesophageal reflux disease)   . Hyperlipidemia   . IBS (irritable bowel syndrome)   . Lupus ([place])    secondary to Rmicade  . Pancreatitis   . Rectal polyp 10/01/2015  . Ulcerative colitis     Family History  Problem Relation Age of Onset  . Irritable bowel syndrome Father   . COPD Father   . Diabetes Maternal Grandfather   . Colon cancer Neg Hx   . Stomach cancer Neg Hx    Past Surgical History:  Procedure Laterality Date  . BREAST CYST ASPIRATION     left  . ESOPHAGUS SURGERY    .  SINUS SURGERY WITH INSTATRAK     Social History   Social History Narrative  . Not on file   Immunization History  Administered Date(s) Administered  . Influenza,inj,Quad PF,6+ Mos 07/09/2012, 08/22/2014, 07/16/2015, 07/23/2016  . PPD Test 09/30/2011, 08/18/2012, 10/31/2013  . Pneumococcal Polysaccharide-23 08/13/2015  . Tdap 07/10/2011     Objective: Vital Signs: LMP 11/12/2016 (Approximate)    Physical Exam   Musculoskeletal Exam: ***  CDAI Exam: CDAI Score: Not documented Patient Global Assessment: Not documented; Provider Global Assessment: Not documented Swollen: Not documented; Tender: Not documented Joint Exam   Not documented   There is currently no information documented on the homunculus. Go to the Rheumatology activity and complete the homunculus joint exam.  Investigation: No additional findings. Component     Latest Ref Rng & Units 08/11/2018  Iron     42 - 145 ug/dL 71  Transferrin     212.0 - 360.0 mg/dL 238.0  Saturation Ratios     20.0 - 50.0 % 21.3  Folate     >5.9 ng/mL >23.9  Vitamin B12     211 - 911 pg/mL 949 (H)  Ferritin     10.0 - 291.0 ng/mL 31.1  CRP     0.5 - 20.0 mg/dL 0.3 (L)   Component     Latest Ref Rng & Units 02/18/2018  QuantiFERON-TB Gold Plus     NEGATIVE NEGATIVE  NIL     IU/mL 0.02  Mitogen-NIL  IU/mL >10.00  TB1-NIL     IU/mL 0.02  TB2-NIL     IU/mL 0.01   Imaging: No results found.  Recent Labs: Lab Results  Component Value Date   WBC 7.6 08/11/2018   HGB 11.7 (L) 08/11/2018   PLT 353.0 08/11/2018   NA 141 08/11/2018   K 3.3 (L) 08/11/2018   CL 104 08/11/2018   CO2 27 08/11/2018   GLUCOSE 130 (H) 08/11/2018   BUN 11 08/11/2018   CREATININE 0.79 08/11/2018   BILITOT 0.3 08/11/2018   ALKPHOS 85 08/11/2018   AST 14 08/11/2018   ALT 18 08/11/2018   PROT 7.0 08/11/2018   ALBUMIN 4.3 08/11/2018   CALCIUM 9.6 08/11/2018   GFRAA 115 02/04/2008   QFTBGOLDPLUS NEGATIVE 02/18/2018    Speciality  Comments: No specialty comments available.  Procedures:  No procedures performed Allergies: Molds & smuts and Remicade [infliximab]   Assessment / Plan:     Visit Diagnoses: Joint pain in fingers of both hands  History of ulcerative colitis  History of gastroesophageal reflux (GERD)  History of pancreatitis  History of IBS  History of hyperlipidemia  History of asthma   Orders: No orders of the defined types were placed in this encounter.  No orders of the defined types were placed in this encounter.   Face-to-face time spent with patient was *** minutes. Greater than 50% of time was spent in counseling and coordination of care.  Follow-Up Instructions: No follow-ups on file.   Ofilia Neas, PA-C  Note - This record has been created using Dragon software.  Chart creation errors have been sought, but may not always  have been located. Such creation errors do not reflect on  the standard of medical care.

## 2018-10-05 ENCOUNTER — Ambulatory Visit: Payer: Self-pay | Admitting: Rheumatology

## 2018-10-18 NOTE — Progress Notes (Deleted)
Office Visit Note  Patient: Kaitlyn Jennings             Date of Birth: Jan 19, 1961           MRN: [id number]             PCP: Manfred Shirts, PA Referring: Mauri Pole, MD Visit Date: 10/29/2018 Occupation: @GUAROCC @  Subjective:  No chief complaint on file.   History of Present Illness: Kaitlyn Jennings is a 58 y.o. female ***   Activities of Daily Living:  Patient reports morning stiffness for *** {minute/hour:19697}.   Patient {ACTIONS;DENIES/REPORTS:21021675::"Denies"} nocturnal pain.  Difficulty dressing/grooming: {ACTIONS;DENIES/REPORTS:21021675::"Denies"} Difficulty climbing stairs: {ACTIONS;DENIES/REPORTS:21021675::"Denies"} Difficulty getting out of chair: {ACTIONS;DENIES/REPORTS:21021675::"Denies"} Difficulty using hands for taps, buttons, cutlery, and/or writing: {ACTIONS;DENIES/REPORTS:21021675::"Denies"}  No Rheumatology ROS completed.   PMFS History:  Patient Active Problem List   Diagnosis Date Noted  . Upper airway cough syndrome 04/05/2018  . B12 DEFICIENCY 03/26/2010  . GASTRITIS 02/04/2008  . ABDOMINAL PAIN, EPIGASTRIC 02/04/2008  . ANXIETY 09/25/2007  . Ulcerative colitis ([place]) 09/25/2007  . IRRITABLE BOWEL SYNDROME 09/25/2007  . ARTHRITIS 09/25/2007  . PANCREATITIS, ACUTE, HX OF 09/25/2007    Past Medical History:  Diagnosis Date  . Anxiety   . 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 Asthma   . B12 deficiency   . Chronic gastritis   . GERD (gastroesophageal reflux disease)   . Hyperlipidemia   . IBS (irritable bowel syndrome)   . Lupus ([place])    secondary to Rmicade  . Pancreatitis   . Rectal polyp 10/01/2015  . Ulcerative colitis     Family History  Problem Relation Age of Onset  . Irritable bowel syndrome Father   . COPD Father   . Diabetes Maternal Grandfather   . Colon cancer Neg Hx   . Stomach cancer Neg Hx    Past Surgical History:  Procedure Laterality Date  . BREAST CYST ASPIRATION     left  . ESOPHAGUS SURGERY    .  SINUS SURGERY WITH INSTATRAK     Social History   Social History Narrative  . Not on file   Immunization History  Administered Date(s) Administered  . Influenza,inj,Quad PF,6+ Mos 07/09/2012, 08/22/2014, 07/16/2015, 07/23/2016  . PPD Test 09/30/2011, 08/18/2012, 10/31/2013  . Pneumococcal Polysaccharide-23 08/13/2015  . Tdap 07/10/2011     Objective: Vital Signs: LMP 11/12/2016 (Approximate)    Physical Exam   Musculoskeletal Exam: ***  CDAI Exam: CDAI Score: Not documented Patient Global Assessment: Not documented; Provider Global Assessment: Not documented Swollen: Not documented; Tender: Not documented Joint Exam   Not documented   There is currently no information documented on the homunculus. Go to the Rheumatology activity and complete the homunculus joint exam.  Investigation: No additional findings. Component     Latest Ref Rng & Units 08/11/2018  Iron     42 - 145 ug/dL 71  Transferrin     212.0 - 360.0 mg/dL 238.0  Saturation Ratios     20.0 - 50.0 % 21.3  Folate     >5.9 ng/mL >23.9  Vitamin B12     211 - 911 pg/mL 949 (H)  Ferritin     10.0 - 291.0 ng/mL 31.1  CRP     0.5 - 20.0 mg/dL 0.3 (L)   Imaging: No results found.  Recent Labs: Lab Results  Component Value Date   WBC 7.6 08/11/2018   HGB 11.7 (L) 08/11/2018   PLT 353.0 08/11/2018  NA 141 08/11/2018   K 3.3 (L) 08/11/2018   CL 104 08/11/2018   CO2 27 08/11/2018   GLUCOSE 130 (H) 08/11/2018   BUN 11 08/11/2018   CREATININE 0.79 08/11/2018   BILITOT 0.3 08/11/2018   ALKPHOS 85 08/11/2018   AST 14 08/11/2018   ALT 18 08/11/2018   PROT 7.0 08/11/2018   ALBUMIN 4.3 08/11/2018   CALCIUM 9.6 08/11/2018   GFRAA 115 02/04/2008   QFTBGOLDPLUS NEGATIVE 02/18/2018    Speciality Comments: No specialty comments available.  Procedures:  No procedures performed Allergies: Molds & smuts and Remicade [infliximab]   Assessment / Plan:     Visit Diagnoses: Pain in both  hands  History of ulcerative colitis  History of lupus ([place]) - listed under problem list  History of asthma  History of pancreatitis  History of IBS  History of gastroesophageal reflux (GERD)  History of hyperlipidemia  B12 deficiency - h/o   Orders: No orders of the defined types were placed in this encounter.  No orders of the defined types were placed in this encounter.   Face-to-face time spent with patient was *** minutes. Greater than 50% of time was spent in counseling and coordination of care.  Follow-Up Instructions: No follow-ups on file.   Ofilia Neas, PA-C  Note - This record has been created using Dragon software.  Chart creation errors have been sought, but may not always  have been located. Such creation errors do not reflect on  the standard of medical care.

## 2018-10-29 ENCOUNTER — Ambulatory Visit: Payer: Self-pay | Admitting: Rheumatology

## 2018-11-01 ENCOUNTER — Ambulatory Visit: Payer: Self-pay | Admitting: Rheumatology

## 2018-11-27 ENCOUNTER — Other Ambulatory Visit: Payer: Self-pay | Admitting: Gastroenterology

## 2018-12-02 ENCOUNTER — Ambulatory Visit: Payer: Self-pay | Admitting: Rheumatology

## 2018-12-15 ENCOUNTER — Telehealth: Payer: Self-pay | Admitting: Gastroenterology

## 2018-12-15 NOTE — Telephone Encounter (Signed)
Pt informed that [employer] will be faxing over disability forms.

## 2018-12-15 NOTE — Telephone Encounter (Signed)
Needs to go to [employer]

## 2018-12-21 ENCOUNTER — Telehealth: Payer: Self-pay | Admitting: Gastroenterology

## 2018-12-21 NOTE — Telephone Encounter (Signed)
Don't have any paperwork in my office, probably still with medical records

## 2018-12-21 NOTE — Telephone Encounter (Signed)
Dr. Silverio Decamp have you seen FMLA paperwork on this pt? She is calling to see if we have received them.

## 2018-12-24 NOTE — Telephone Encounter (Signed)
Pt aware.

## 2018-12-27 NOTE — Telephone Encounter (Signed)
Pt called to inform Dr. Silverio Decamp that she got an extension on [employer] so Dr. Silverio Decamp can have more time to sign FMLA papers.

## 2019-01-03 NOTE — Telephone Encounter (Signed)
I already signed it and gave it to [employer] [facility]. Thanks

## 2019-01-10 ENCOUNTER — Ambulatory Visit: Payer: Self-pay | Admitting: Rheumatology

## 2019-01-14 ENCOUNTER — Encounter: Payer: Self-pay | Admitting: Gastroenterology

## 2019-01-24 ENCOUNTER — Ambulatory Visit: Payer: Medicare Other | Admitting: Gastroenterology

## 2019-02-02 ENCOUNTER — Telehealth: Payer: Self-pay

## 2019-02-02 NOTE — Telephone Encounter (Signed)
Phone screening complete 

## 2019-02-03 ENCOUNTER — Telehealth: Payer: Self-pay | Admitting: Gastroenterology

## 2019-02-03 ENCOUNTER — Other Ambulatory Visit: Payer: Medicare Other

## 2019-02-03 ENCOUNTER — Other Ambulatory Visit: Payer: Self-pay

## 2019-02-03 ENCOUNTER — Telehealth: Payer: Self-pay | Admitting: *Deleted

## 2019-02-03 ENCOUNTER — Telehealth: Payer: Self-pay

## 2019-02-03 ENCOUNTER — Ambulatory Visit (INDEPENDENT_AMBULATORY_CARE_PROVIDER_SITE_OTHER): Payer: Medicare Other | Admitting: Gastroenterology

## 2019-02-03 VITALS — Ht 68.5 in | Wt 242.0 lb

## 2019-02-03 DIAGNOSIS — R05 Cough: Secondary | ICD-10-CM | POA: Diagnosis not present

## 2019-02-03 DIAGNOSIS — R509 Fever, unspecified: Secondary | ICD-10-CM | POA: Diagnosis not present

## 2019-02-03 DIAGNOSIS — R0602 Shortness of breath: Secondary | ICD-10-CM

## 2019-02-03 DIAGNOSIS — K5100 Ulcerative (chronic) pancolitis without complications: Secondary | ICD-10-CM

## 2019-02-03 DIAGNOSIS — J029 Acute pharyngitis, unspecified: Secondary | ICD-10-CM

## 2019-02-03 DIAGNOSIS — R059 Cough, unspecified: Secondary | ICD-10-CM

## 2019-02-03 DIAGNOSIS — K219 Gastro-esophageal reflux disease without esophagitis: Secondary | ICD-10-CM

## 2019-02-03 DIAGNOSIS — M791 Myalgia, unspecified site: Secondary | ICD-10-CM

## 2019-02-03 NOTE — Telephone Encounter (Signed)
Magda Paganini, can you follow up. Thanks

## 2019-02-03 NOTE — Telephone Encounter (Signed)
Lm for patient that COVID testing has been scheduled for 12:00pm today at [facility]  I requested she call me back to let me know she got this message.

## 2019-02-03 NOTE — Telephone Encounter (Signed)
Called patient and left message that appointment today is at [facility] not 12:30pm

## 2019-02-03 NOTE — Progress Notes (Signed)
Kaitlyn Jennings    [id number]    [birth year]  Primary Care Physician:Beane, Mirian Mo, PA  Referring Physician: Manfred Shirts, [place], [place] [address]  This service was provided via audio only telemedicine (Doximity) due to [place] 19 pandemic.  Patient location: Home Provider location: Office Used 2 patient identifiers to confirm the correct person. Explained the limitations in evaluation and management via telemedicine. Patient is aware of potential medical charges for this visit.  Patient consented to this virtual visit.  The persons participating in this telemedicine service were myself and the patient  Interactive audio and video telecommunications were attempted between this provider and patient, however failed, due to patient having technical difficulties OR patient did not have access to video capability. We continued and completed visit with audio only.  Time spent on call: 22 minutes  Chief complaint: Fever, cough and shortness of breath  HPI:  58 year old female with history of ulcerative colitis diagnosed in 1998, on maintenance therapy sulfasalazine and 6-MP. Last office visit August 11, 2018  She has been having fever 101 F, cough and shortness of breath for past 2 days associated with generalized fatigue, myalgia and does not feel well. No history of exposure to COVID-19.  She has not been going out of house for the past few months nobody else in the household is sick.  She had 2 other episodes of bronchitis in the past 3 months treated with Z-Pak and prednisone taper.  She is currently not on any antibiotics or prednisone.  She is continuing sulfasalazine and 6-MP, denies any change in bowel habits or rectal bleeding.  She is having formed 2-3 bowel movements daily.  Intermittent lower abdominal cramping improves with the dicyclomine as needed.  Continues to struggle with depression and grief from loss of her mother last year.  She has  been treated by her PMD. Review of system positive for chronic low back pain.   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  Subsequently switched to [facility] in 2018, but she has missed multiple infusions and is getting the dose erratically. Last dose about 6 months ago. Morrie Sheldon had higher out-of-pocket deductible, patient does not want to start it.    Outpatient Encounter Medications as of 02/03/2019  Medication Sig  . albuterol (PROVENTIL) (2.5 MG/3ML) 0.083% nebulizer solution Take 2.5 mg by nebulization as needed.    Marland Kitchen buPROPion (WELLBUTRIN XL) 150 MG 24 hr tablet Take 1 tablet by mouth daily.  . Calcium Carbonate-Vitamin D (CALCIUM 500 + D PO) Take 1 capsule by mouth daily.    . Cholecalciferol (VITAMIN D) 1000 UNITS capsule Take 1,000 Units by mouth daily.    Marland Kitchen dicyclomine (BENTYL) 20 MG tablet TAKE 1 TABLET BY MOUTH 4 TIMES DAILY WITH MEALS AND  AT BEDTIME  . esomeprazole (NEXIUM) 40 MG capsule Take 1 capsule (40 mg total) by mouth 2 (two) times daily before a meal. (Patient taking differently: Take 40 mg by mouth daily. )  . ferrous sulfate 325 (65 FE) MG tablet Take 325 mg by mouth daily with breakfast.    . fluticasone (FLONASE) 50 MCG/ACT nasal  spray Place 2 sprays into both nostrils daily.  . folic acid (FOLVITE) 1 MG tablet TAKE 1 TABLET BY MOUTH ONCE DAILY  . Lactobacillus Rhamnosus, GG, (CULTURELLE PO) Take 1 capsule by mouth daily.  . mercaptopurine (PURINETHOL) 50 MG tablet TAKE 1 TABLET BY MOUTH DAILY ON AN EMPTY STOMACH ONE  HOUR  BEFORE  OR  2  HOURS  AFTER  MEALS  . mometasone-formoterol (DULERA) 100-5  MCG/ACT AERO Take 2 puffs first thing in am and then another 2 puffs about 12 hours later.  . mupirocin ointment (BACTROBAN) 2 % Apply topically as directed.  . ondansetron (ZOFRAN) 4 MG tablet Take 4 mg by mouth every 8 (eight) hours as needed for nausea.   . polyethylene glycol powder (GLYCOLAX/MIRALAX) powder Reported on 10/01/2015  . rosuvastatin (CRESTOR) 10 MG tablet Take 10 mg by mouth daily.    . sucralfate (CARAFATE) 1 g tablet TAKE 1 TABLET BY MOUTH TWICE DAILY  . sulfaSALAzine (AZULFIDINE) 500 MG tablet Take 2 tablets by mouth twice daily  . venlafaxine XR (EFFEXOR-XR) 150 MG 24 hr capsule Take 150 mg by mouth daily with breakfast.  . vitamin B-12 (CYANOCOBALAMIN) 1000 MCG tablet Take 2 tablets by mouth once daily   . Vitamin D, Ergocalciferol, (DRISDOL) 50000 units CAPS capsule Take 50,000 Units by mouth once a week.   No facility-administered encounter medications on file as of 02/03/2019.     Allergies as of 02/03/2019 - Review Complete 02/02/2019  Allergen Reaction Noted  . Molds & smuts  02/09/2013  . Remicade [infliximab] Other (See Comments) 08/26/2006    Past Medical History:  Diagnosis Date  . Anxiety   . 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 Asthma   . B12 deficiency   . Chronic gastritis   . GERD (gastroesophageal reflux disease)   . Hyperlipidemia   . IBS (irritable bowel syndrome)   . Lupus ([place])    secondary to Rmicade  . Pancreatitis   . Rectal polyp 10/01/2015  . Ulcerative colitis     Past Surgical History:  Procedure Laterality Date  . BREAST CYST ASPIRATION     left  . ESOPHAGUS SURGERY    . SINUS SURGERY WITH INSTATRAK      Family History  Problem Relation Age of Onset  . Irritable bowel syndrome Father   . COPD Father   . Diabetes Maternal Grandfather   . Colon cancer Neg Hx   . Stomach cancer Neg Hx     Social History   Socioeconomic History  . Marital status: Married    Spouse name: Not on file  . Number of children: 0  .  Years of education: Not on file  . Highest education level: Not on file  Occupational History  . Occupation: FLIGHT ATTENDANT  Social Needs  . Financial resource strain: Not on file  . Food insecurity:    Worry: Not on file    Inability: Not on file  . Transportation needs:    Medical: Not on file    Non-medical: Not on file  Tobacco Use  . Smoking status: Never Smoker  . Smokeless tobacco: Never Used  Substance and Sexual Activity  . Alcohol use: No    Alcohol/week: 0.0 standard drinks  . Drug use: No  . Sexual activity: Yes    Partners: Male    Birth control/protection: Pill  Lifestyle  . Physical activity:    Days per week: Not on file    Minutes per session:  Not on file  . Stress: Not on file  Relationships  . Social connections:    Talks on phone: Not on file    Gets together: Not on file    Attends religious service: Not on file    Active member of club or organization: Not on file    Attends meetings of clubs or organizations: Not on file    Relationship status: Not on file  . Intimate partner violence:    Fear of current or ex partner: Not on file    Emotionally abused: Not on file    Physically abused: Not on file    Forced sexual activity: Not on file  Other Topics Concern  . Not on file  Social History Narrative  . Not on file      Review of systems: Review of Systems as per HPI All other systems reviewed and are negative.   Physical Exam: Vitals were not taken and physical exam was not performed during this virtual visit.  Data Reviewed:  Reviewed labs, radiology imaging, old records and pertinent past GI work up   Assessment and Plan/Recommendations:  58 year old female with history of ulcerative colitis initially diagnosed in 1998, and clinical remission on 6-MP and sulfasalazine  Continue 6-MP and sulfasalazine with folic acid Follow-up labs in June (CBC, CMP, CRP, iron panel, folate and B12), she has annual physical on June 17 and will  do labs through her PMD  GERD: Continue Nexium daily Antireflux measures  Intermittent lower abdominal cramps/IBS Continue dicyclomine as needed  Test for SARS-CoV-2 to exclude COVID-19 given her symptoms (fever  X2 days, cough, shortness of breath, generalized fatigue and myalgia ) and immunosuppressive therapy for UC  Follow-up telemedicine visit in 2 months or sooner if needed  K. Denzil Magnuson , MD   CC: Manfred Shirts, PA

## 2019-02-03 NOTE — Telephone Encounter (Signed)
Yes, she should do it. Thanks

## 2019-02-03 NOTE — Telephone Encounter (Signed)
Pt call back in to advised that she recv msg and will go get tested for covid at 12:30p

## 2019-02-03 NOTE — Telephone Encounter (Signed)
Contacted by Julieanne Cotton, CMA from [place]; she states that Dr Harl Bowie would like to have the pt tested for COVID because the pt is having fever,sore throat, cough, difficulty breathing, and malaise; the pt can be contacted at [phone]; will attempt to contact pt.

## 2019-02-03 NOTE — Telephone Encounter (Signed)
Pt aware.

## 2019-02-03 NOTE — Patient Instructions (Addendum)
Test for SARS-CoV-2 to exclude COVID-19 given her symptoms (fever  X2 days, cough, shortness of breath, generalized fatigue and myalgia ) and immunosuppressive therapy for UC  Follow-up telemedicine visit in 2 months.  Please call the office to schedule this as her schedule is not out that far.

## 2019-02-03 NOTE — Telephone Encounter (Signed)
Waiting on results of testing

## 2019-02-03 NOTE — Telephone Encounter (Signed)
Attempted to contact pt for scheduling COVID test; left message on voicemail for pt to call back; will route to provider for notification.

## 2019-02-03 NOTE — Telephone Encounter (Signed)
Dr. Silverio Decamp please see note below and advise.

## 2019-02-03 NOTE — Telephone Encounter (Signed)
Pt called stating that she was just talking to Dr. Silverio Decamp. She wanted to let her know that her temperature went down to 89 F. She wants to know if Dr. Silverio Decamp still wants her to have test for Covid-19. Pls call pt.

## 2019-02-04 LAB — NOVEL CORONAVIRUS, NAA: SARS-CoV-2, NAA: NOT DETECTED

## 2019-02-07 IMAGING — CT CT PARANASAL SINUSES LIMITED
1 of 2 series · 9 of 12 positions shown, 12 images · non-contrast
Comparison: None.

CLINICAL DATA: Upper airway cough syndrome

EXAM:
CT PARANASAL SINUS LIMITED WITHOUT CONTRAST
TECHNIQUE: Non-contiguous multidetector CT images of the paranasal sinuses were
obtained in a single plane without contrast.

[Series 4: limited sinus st · axial · 0.22mm/px · z∈[+126,+206]mm · 9 of 11 slices shown, 12 images]
[im 2/11  brain]
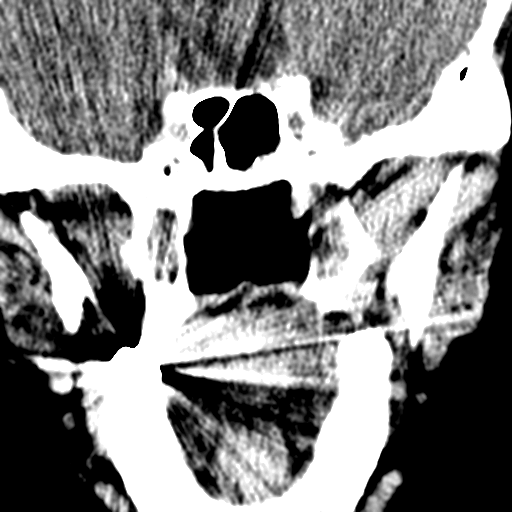
[im 2/11  bone]
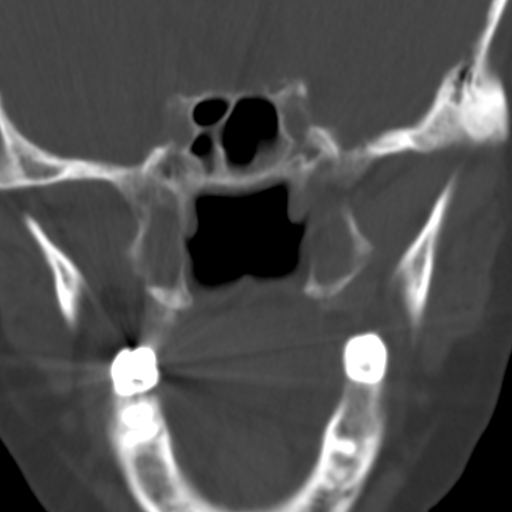
[im 3/11  bone]
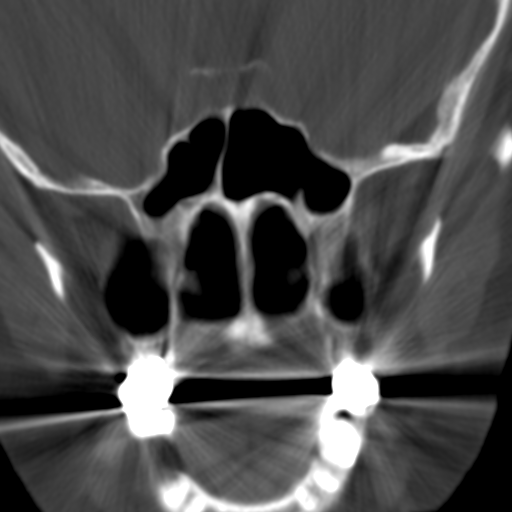
[im 4/11  bone]
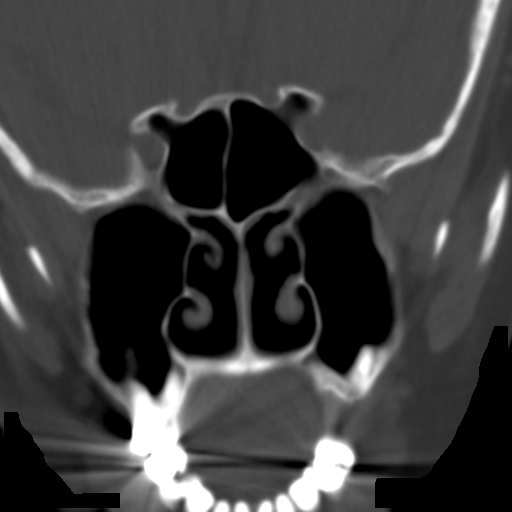
[im 5/11  bone]
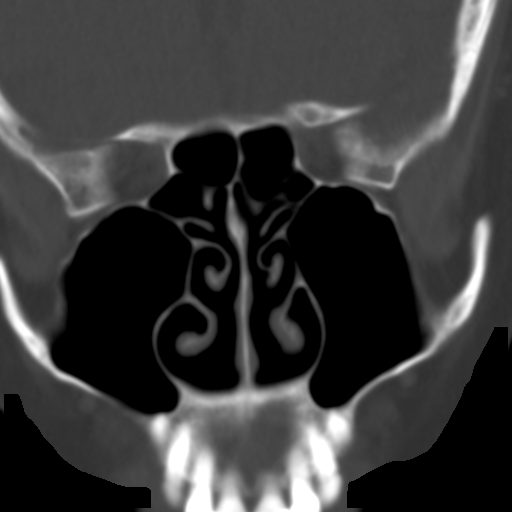
[im 6/11  brain]
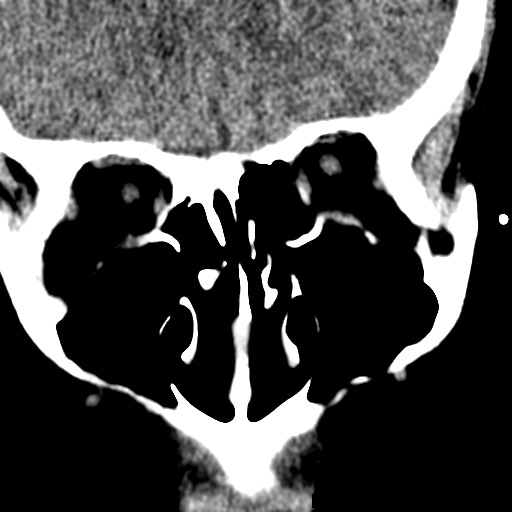
[im 6/11  bone]
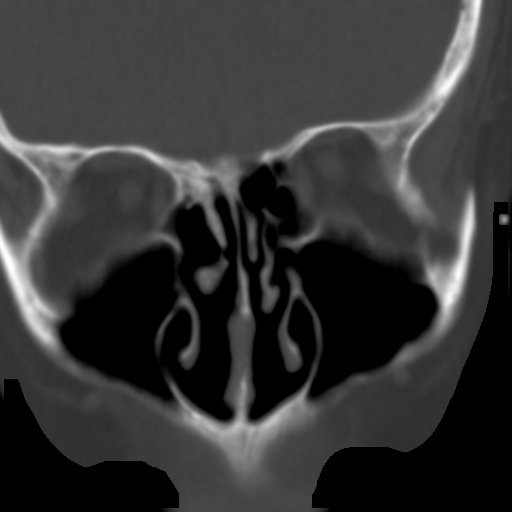
[im 7/11  bone]
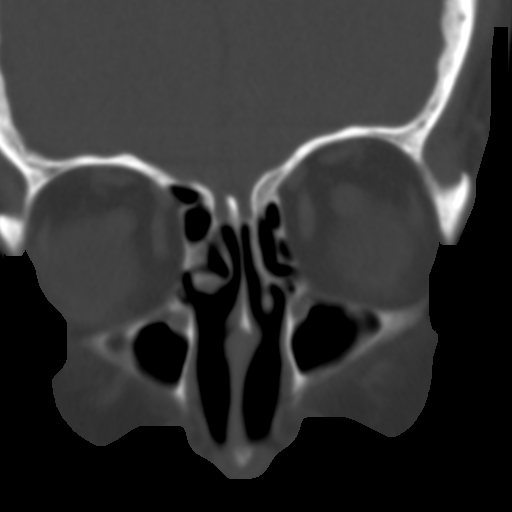
[im 8/11  bone]
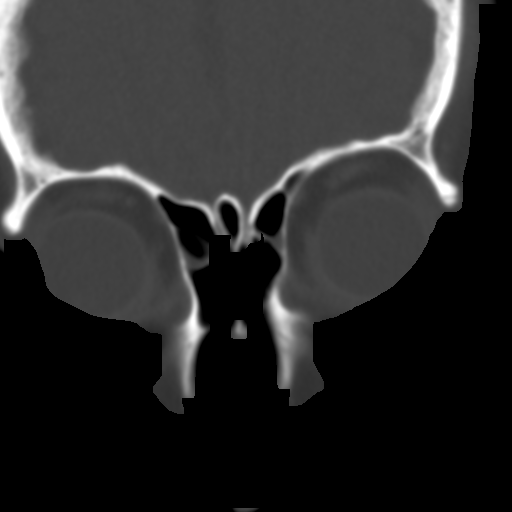
[im 9/11  bone]
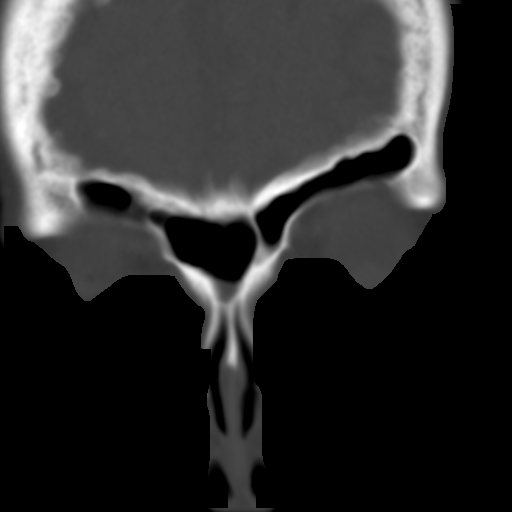
[im 10/11  brain]
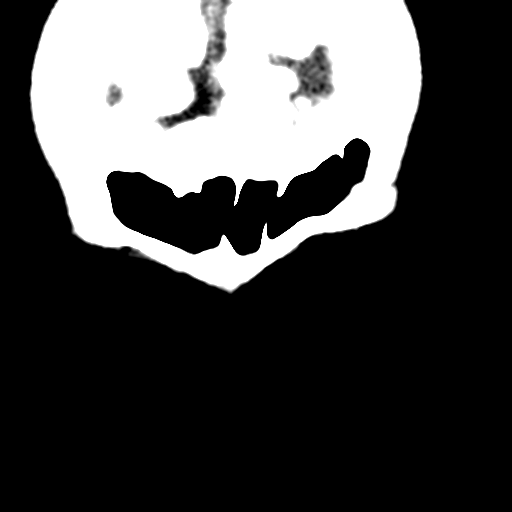
[im 10/11  bone]
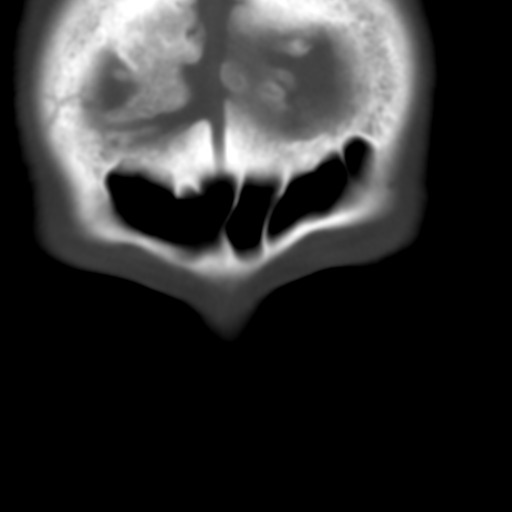

[9 of 12 positions shown; findings below may reference images not displayed]

FINDINGS: Paranasal sinuses are well developed and clear bilaterally. No
mucosal edema or air-fluid level. Nasal passageway widely patent
bilaterally. No acute skeletal abnormality. Regional soft tissues
negative
IMPRESSION: Negative

## 2019-02-23 NOTE — Progress Notes (Deleted)
Office Visit Note  Patient: Kaitlyn Jennings             Date of Birth: 05/23/1961           MRN: [id number]             PCP: Manfred Shirts, PA Referring: Mauri Pole, MD Visit Date: 03/09/2019 Occupation: @GUAROCC @  Subjective:  No chief complaint on file.   History of Present Illness: Kaitlyn Jennings is a 58 y.o. female ***   Activities of Daily Living:  Patient reports morning stiffness for *** {minute/hour:19697}.   Patient {ACTIONS;DENIES/REPORTS:21021675::"Denies"} nocturnal pain.  Difficulty dressing/grooming: {ACTIONS;DENIES/REPORTS:21021675::"Denies"} Difficulty climbing stairs: {ACTIONS;DENIES/REPORTS:21021675::"Denies"} Difficulty getting out of chair: {ACTIONS;DENIES/REPORTS:21021675::"Denies"} Difficulty using hands for taps, buttons, cutlery, and/or writing: {ACTIONS;DENIES/REPORTS:21021675::"Denies"}  No Rheumatology ROS completed.   PMFS History:  Patient Active Problem List   Diagnosis Date Noted  . Upper airway cough syndrome 04/05/2018  . B12 DEFICIENCY 03/26/2010  . GASTRITIS 02/04/2008  . ABDOMINAL PAIN, EPIGASTRIC 02/04/2008  . ANXIETY 09/25/2007  . Ulcerative colitis ([place]) 09/25/2007  . IRRITABLE BOWEL SYNDROME 09/25/2007  . ARTHRITIS 09/25/2007  . PANCREATITIS, ACUTE, HX OF 09/25/2007    Past Medical History:  Diagnosis Date  . Anxiety   . 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 Asthma   . B12 deficiency   . Chronic gastritis   . GERD (gastroesophageal reflux disease)   . Hyperlipidemia   . IBS (irritable bowel syndrome)   . Lupus ([place])    secondary to Rmicade  . Pancreatitis   . Rectal polyp 10/01/2015  . Ulcerative colitis     Family History  Problem Relation Age of Onset  . Irritable bowel syndrome Father   . COPD Father   . Diabetes Maternal Grandfather   . Colon cancer Neg Hx   . Stomach cancer Neg Hx    Past Surgical History:  Procedure Laterality Date  . BREAST CYST ASPIRATION     left  . ESOPHAGUS SURGERY    .  SINUS SURGERY WITH INSTATRAK     Social History   Social History Narrative  . Not on file   Immunization History  Administered Date(s) Administered  . Influenza,inj,Quad PF,6+ Mos 07/09/2012, 08/22/2014, 07/16/2015, 07/23/2016  . PPD Test 09/30/2011, 08/18/2012, 10/31/2013  . Pneumococcal Polysaccharide-23 08/13/2015  . Tdap 07/10/2011     Objective: Vital Signs: LMP 11/12/2016 (Approximate)    Physical Exam   Musculoskeletal Exam: ***  CDAI Exam: CDAI Score: - Patient Global: -; Provider Global: - Swollen: -; Tender: - Joint Exam   No joint exam has been documented for this visit   There is currently no information documented on the homunculus. Go to the Rheumatology activity and complete the homunculus joint exam.  Investigation: No additional findings.  Imaging: No results found.  Recent Labs: Lab Results  Component Value Date   WBC 7.6 08/11/2018   HGB 11.7 (L) 08/11/2018   PLT 353.0 08/11/2018   NA 141 08/11/2018   K 3.3 (L) 08/11/2018   CL 104 08/11/2018   CO2 27 08/11/2018   GLUCOSE 130 (H) 08/11/2018   BUN 11 08/11/2018   CREATININE 0.79 08/11/2018   BILITOT 0.3 08/11/2018   ALKPHOS 85 08/11/2018   AST 14 08/11/2018   ALT 18 08/11/2018   PROT 7.0 08/11/2018   ALBUMIN 4.3 08/11/2018   CALCIUM 9.6 08/11/2018   GFRAA 115 02/04/2008   QFTBGOLDPLUS NEGATIVE 02/18/2018    Speciality Comments: No specialty comments available.  Procedures:  No procedures performed Allergies: Molds & smuts and Remicade [infliximab]   Assessment / Plan:     Visit Diagnoses: No diagnosis found.   Orders: No orders of the defined types were placed in this encounter.  No orders of the defined types were placed in this encounter.   Face-to-face time spent with patient was *** minutes. Greater than 50% of time was spent in counseling and coordination of care.  Follow-Up Instructions: No follow-ups on file.   Ofilia Neas, PA-C  Note - This record has been  created using Dragon software.  Chart creation errors have been sought, but may not always  have been located. Such creation errors do not reflect on  the standard of medical care.

## 2019-03-05 ENCOUNTER — Other Ambulatory Visit: Payer: Self-pay | Admitting: Gastroenterology

## 2019-03-09 ENCOUNTER — Ambulatory Visit: Payer: Self-pay | Admitting: Rheumatology

## 2019-03-19 ENCOUNTER — Other Ambulatory Visit: Payer: Self-pay | Admitting: Gastroenterology

## 2019-03-31 ENCOUNTER — Encounter: Payer: Self-pay | Admitting: *Deleted

## 2019-04-05 ENCOUNTER — Encounter: Payer: Self-pay | Admitting: Gastroenterology

## 2019-04-05 ENCOUNTER — Ambulatory Visit (INDEPENDENT_AMBULATORY_CARE_PROVIDER_SITE_OTHER): Payer: Medicare Other | Admitting: Gastroenterology

## 2019-04-05 VITALS — Ht 68.5 in | Wt 238.0 lb

## 2019-04-05 DIAGNOSIS — K5100 Ulcerative (chronic) pancolitis without complications: Secondary | ICD-10-CM

## 2019-04-05 DIAGNOSIS — K219 Gastro-esophageal reflux disease without esophagitis: Secondary | ICD-10-CM

## 2019-04-05 MED ORDER — ESOMEPRAZOLE MAGNESIUM 40 MG PO CPDR: DELAYED_RELEASE_CAPSULE | ORAL | 11 refills | Status: DC

## 2019-04-05 MED ORDER — MERCAPTOPURINE 50 MG PO TABS: ORAL_TABLET | ORAL | 3 refills | Status: DC

## 2019-04-05 NOTE — Patient Instructions (Addendum)
Check CBC, CMP, ferritin, iron panel, S28, folic acid and CRP ( Go to the basement at our lab [address] 7:30am - 4:40pm ) to have your labs drawn   Refill 6-MP,  and Nexium. ( Refills sent to your pharmacy)  Follow-up office visit in 4 to 6 months  Recall colonoscopy April 2021  I appreciate the  opportunity to care for you  Thank You   Harl Bowie , MD

## 2019-04-05 NOTE — Progress Notes (Signed)
Kaitlyn Jennings    [id number]    03-Nov-1960  Primary Care Physician:Beane, Mirian Mo, PA  Referring Physician: Manfred Shirts, [place] [address] [place],  [place] [address]  This service was provided via  telemedicine due to [place] 19 pandemic.  I connected with@ on 04/05/19 at 10:00 AM EDT by a video enabled telemedicine application and verified that I am speaking with the correct person using two identifiers.  Patient location: Home Provider location: Office   I discussed the limitations, risks, security and privacy concerns of performing an evaluation and management service by video enabled telemedicine application and the availability of in person appointments. I also discussed with the patient that there may be a patient responsible charge related to this service. The patient expressed understanding and agreed to proceed.   The persons participating in this telemedicine service were myself and the patient  Interactive audio and video telecommunications were attempted between this provider and patient, however failed, due to patient having technical difficulties OR patient did not have access to video capability. We continued and completed visit with audio only.    Chief complaint: Ulcerative colitis HPI:  58 year old female with ulcerative colitis for follow-up visit. She is currently doing well on sulfasalazine and 6-MP.  Denies any rectal bleeding.  She is having 2-3 formed bowel movements daily. Denies any nausea, vomiting, abdominal pain, melena or bright red blood per rectum    GI history: Ulcerative colitis initially diagnosed in 1998 Allergic reaction to Remicade, was maintained on Humira with loss of clinical response 2016 (elevated antibody level with undetectable drug trough), switched to [facility] May 2017, was discontinued by patient due to lack of adequate insurance coverage with high out-of-pocket deductible.  Subsequently switched to [facility] in 2018, but she  has missed multiple infusions and was getting it erratically. Morrie Sheldon had higher out-of-pocket deductible, patient did not want to start it.   Colonoscopy December 25, 2016: Moderately active proctitis, 12 mm polyp removed (sessile serrated adenoma)   Outpatient Encounter Medications as of 04/05/2019  Medication Sig  . albuterol (PROVENTIL) (2.5 MG/3ML) 0.083% nebulizer solution Take 2.5 mg by nebulization as needed.    Marland Kitchen buPROPion (WELLBUTRIN XL) 150 MG 24 hr tablet Take 1 tablet by mouth daily.  . Calcium Carbonate-Vitamin D (CALCIUM 500 + D PO) Take 1 capsule by mouth daily.    . Cholecalciferol (VITAMIN D) 1000 UNITS capsule Take 1,000 Units by mouth daily.    Marland Kitchen dicyclomine (BENTYL) 20 MG tablet TAKE 1 TABLET BY MOUTH 4 TIMES DAILY WITH MEALS AND  AT BEDTIME  . esomeprazole (NEXIUM) 40 MG capsule TAKE 1 CAPSULE BY MOUTH TWICE DAILY BEFORE A MEAL (Patient taking differently: Take 40 mg by mouth daily at 12 noon. )  . ferrous sulfate 325 (65 FE) MG tablet Take 325 mg by mouth daily with breakfast.    . fluticasone (FLONASE) 50 MCG/ACT nasal spray Place 2 sprays into both nostrils daily.  . folic acid (FOLVITE) 1 MG tablet TAKE 1 TABLET BY MOUTH ONCE DAILY  . Lactobacillus Rhamnosus, GG, (CULTURELLE PO) Take 1 capsule by mouth daily.  . mercaptopurine (PURINETHOL) 50 MG tablet TAKE 1 TABLET BY MOUTH DAILY ON AN EMPTY STOMACH ONE  HOUR  BEFORE  OR  2  HOURS  AFTER  MEALS  . mometasone-formoterol (DULERA) 100-5 MCG/ACT AERO Take 2 puffs first thing in am and then another 2 puffs about 12 hours later.  Marland Kitchen  mupirocin ointment (BACTROBAN) 2 % Apply topically as directed.  . ondansetron (ZOFRAN) 4 MG tablet Take 4 mg by mouth every 8 (eight) hours as needed for nausea.   . polyethylene glycol powder (GLYCOLAX/MIRALAX) powder Reported on 10/01/2015  . rosuvastatin (CRESTOR) 10 MG tablet Take 10 mg by mouth daily.    . sucralfate (CARAFATE) 1 g tablet TAKE 1 TABLET BY MOUTH TWICE DAILY  . sulfaSALAzine  (AZULFIDINE) 500 MG tablet Take 2 tablets by mouth twice daily  . venlafaxine XR (EFFEXOR-XR) 150 MG 24 hr capsule Take 150 mg by mouth daily with breakfast.  . vitamin B-12 (CYANOCOBALAMIN) 1000 MCG tablet Take 2 tablets by mouth once daily   . Vitamin D, Ergocalciferol, (DRISDOL) 50000 units CAPS capsule Take 50,000 Units by mouth once a week.   No facility-administered encounter medications on file as of 04/05/2019.     Allergies as of 04/05/2019 - Review Complete 04/05/2019  Allergen Reaction Noted  . Molds & smuts  02/09/2013  . Remicade [infliximab] Other (See Comments) 08/26/2006    Past Medical History:  Diagnosis Date  . Anxiety   . 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 Asthma   . B12 deficiency   . Chronic gastritis   . GERD (gastroesophageal reflux disease)   . Hyperlipidemia   . IBS (irritable bowel syndrome)   . Lupus ([place])    secondary to Rmicade  . Pancreatitis   . Rectal polyp 10/01/2015  . Ulcerative colitis     Past Surgical History:  Procedure Laterality Date  . BREAST CYST ASPIRATION     left  . ESOPHAGUS SURGERY    . SINUS SURGERY WITH INSTATRAK      Family History  Problem Relation Age of Onset  . Irritable bowel syndrome Father   . COPD Father   . Diabetes Maternal Grandfather   . Colon cancer Neg Hx   . Stomach cancer Neg Hx     Social History   Socioeconomic History  . Marital status: Married    Spouse name: Not on file  . Number of children: 0  . Years of education: Not on file  . Highest education level: Not on file  Occupational History  . Occupation: FLIGHT ATTENDANT  Social Needs  . Financial resource strain: Not on file  . Food insecurity    Worry: Not on file    Inability: Not on file  . Transportation needs    Medical: Not on file    Non-medical: Not on file  Tobacco Use  . Smoking status: Never Smoker  . Smokeless tobacco: Never Used  Substance and Sexual Activity  . Alcohol use: No    Alcohol/week: 0.0 standard  drinks  . Drug use: No  . Sexual activity: Yes    Partners: Male    Birth control/protection: Pill  Lifestyle  . Physical activity    Days per week: Not on file    Minutes per session: Not on file  . Stress: Not on file  Relationships  . Social Herbalist on phone: Not on file    Gets together: Not on file    Attends religious service: Not on file    Active member of club or organization: Not on file    Attends meetings of clubs or organizations: Not on file    Relationship status: Not on file  . Intimate partner violence    Fear of current or ex partner: Not on file  Emotionally abused: Not on file    Physically abused: Not on file    Forced sexual activity: Not on file  Other Topics Concern  . Not on file  Social History Narrative  . Not on file      Review of systems: Review of Systems as per HPI All other systems reviewed and are negative.   Observations/Objective:   Data Reviewed:  Reviewed labs, radiology imaging, old records and pertinent past GI work up   Assessment and Plan/Recommendations: 58 year old female with ulcerative colitis, currently in clinical remission on sulfasalazine and 6-MP  Check CBC, CMP, ferritin, iron panel, Q75, folic acid and CRP  Continue 6-MP and sulfasalazine along with folic acid  GERD: Continue Nexium and antireflux measures  Recall colonoscopy April 2021 for surveillance for IBD and history of adenomatous colon polyp  Follow-up office visit in 4 to 6 months     I discussed the assessment and treatment plan with the patient. The patient was provided an opportunity to ask questions and all were answered. The patient agreed with the plan and demonstrated an understanding of the instructions.   The patient was advised to call back or seek an in-person evaluation if the symptoms worsen or if the condition fails to improve as anticipated.  I provided 22 minutes of non-face-to-face time during this encounter.    Harl Bowie, MD   CC: Manfred Shirts, PA

## 2019-04-07 ENCOUNTER — Encounter: Payer: Self-pay | Admitting: Gastroenterology

## 2019-04-11 ENCOUNTER — Telehealth: Payer: Self-pay | Admitting: Gastroenterology

## 2019-04-11 NOTE — Telephone Encounter (Signed)
Noted  

## 2019-06-02 ENCOUNTER — Other Ambulatory Visit: Payer: Self-pay | Admitting: Gastroenterology

## 2019-08-24 ENCOUNTER — Other Ambulatory Visit: Payer: Self-pay | Admitting: Gastroenterology

## 2019-09-22 ENCOUNTER — Other Ambulatory Visit (INDEPENDENT_AMBULATORY_CARE_PROVIDER_SITE_OTHER): Payer: Medicare Other

## 2019-09-22 ENCOUNTER — Other Ambulatory Visit: Payer: Self-pay

## 2019-09-22 ENCOUNTER — Ambulatory Visit: Payer: Medicare Other | Admitting: Gastroenterology

## 2019-09-22 ENCOUNTER — Encounter: Payer: Self-pay | Admitting: Gastroenterology

## 2019-09-22 VITALS — BP 124/80 | HR 89 | Temp 97.8°F | Ht 69.0 in | Wt 263.0 lb

## 2019-09-22 DIAGNOSIS — M545 Low back pain, unspecified: Secondary | ICD-10-CM

## 2019-09-22 DIAGNOSIS — K219 Gastro-esophageal reflux disease without esophagitis: Secondary | ICD-10-CM | POA: Diagnosis not present

## 2019-09-22 DIAGNOSIS — R110 Nausea: Secondary | ICD-10-CM

## 2019-09-22 DIAGNOSIS — K51919 Ulcerative colitis, unspecified with unspecified complications: Secondary | ICD-10-CM

## 2019-09-22 LAB — CBC WITH DIFFERENTIAL/PLATELET
Basophils Absolute: 0.1 10*3/uL (ref 0.0–0.1)
Basophils Relative: 0.7 % (ref 0.0–3.0)
Eosinophils Absolute: 0.1 10*3/uL (ref 0.0–0.7)
Eosinophils Relative: 1.9 % (ref 0.0–5.0)
HCT: 36.1 % (ref 36.0–46.0)
Hemoglobin: 11.8 g/dL — ABNORMAL LOW (ref 12.0–15.0)
Lymphocytes Relative: 26.5 % (ref 12.0–46.0)
Lymphs Abs: 2.1 10*3/uL (ref 0.7–4.0)
MCHC: 32.6 g/dL (ref 30.0–36.0)
MCV: 85.5 fl (ref 78.0–100.0)
Monocytes Absolute: 0.5 10*3/uL (ref 0.1–1.0)
Monocytes Relative: 7 % (ref 3.0–12.0)
Neutro Abs: 5 10*3/uL (ref 1.4–7.7)
Neutrophils Relative %: 63.9 % (ref 43.0–77.0)
Platelets: 391 10*3/uL (ref 150.0–400.0)
RBC: 4.23 Mil/uL (ref 3.87–5.11)
RDW: 15.9 % — ABNORMAL HIGH (ref 11.5–15.5)
WBC: 7.8 10*3/uL (ref 4.0–10.5)

## 2019-09-22 LAB — HIGH SENSITIVITY CRP: CRP, High Sensitivity: 2.66 mg/L (ref 0.000–5.000)

## 2019-09-22 LAB — COMPREHENSIVE METABOLIC PANEL
ALT: 13 U/L (ref 0–35)
AST: 13 U/L (ref 0–37)
Albumin: 4.3 g/dL (ref 3.5–5.2)
Alkaline Phosphatase: 107 U/L (ref 39–117)
BUN: 13 mg/dL (ref 6–23)
CO2: 27 mEq/L (ref 19–32)
Calcium: 9.8 mg/dL (ref 8.4–10.5)
Chloride: 105 mEq/L (ref 96–112)
Creatinine, Ser: 0.79 mg/dL (ref 0.40–1.20)
GFR: 74.45 mL/min (ref >60.00)
Glucose, Bld: 107 mg/dL — ABNORMAL HIGH (ref 70–99)
Potassium: 4.1 mEq/L (ref 3.5–5.1)
Sodium: 140 mEq/L (ref 135–145)
Total Bilirubin: 0.3 mg/dL (ref 0.2–1.2)
Total Protein: 7.2 g/dL (ref 6.0–8.3)

## 2019-09-22 LAB — IBC PANEL
Iron: 91 ug/dL (ref 42–145)
Saturation Ratios: 26.6 % (ref 20.0–50.0)
Transferrin: 244 mg/dL (ref 212.0–360.0)

## 2019-09-22 LAB — VITAMIN B12: Vitamin B-12: 260 pg/mL (ref 211–911)

## 2019-09-22 LAB — FERRITIN: Ferritin: 19.9 ng/mL (ref 10.0–291.0)

## 2019-09-22 LAB — FOLATE: Folate: >24.1 ng/mL (ref >5.9)

## 2019-09-22 MED ORDER — MERCAPTOPURINE 50 MG PO TABS: ORAL_TABLET | ORAL | 3 refills | Status: DC

## 2019-09-22 MED ORDER — TRAMADOL HCL 50 MG PO TABS: 50.0000 mg | ORAL_TABLET | Freq: Two times a day (BID) | ORAL | 0 refills | Status: DC | PRN

## 2019-09-22 MED ORDER — FOLIC ACID 1 MG PO TABS: 1.0000 mg | ORAL_TABLET | Freq: Every day | ORAL | 3 refills | Status: DC

## 2019-09-22 MED ORDER — SULFASALAZINE 500 MG PO TABS: 1000.0000 mg | ORAL_TABLET | Freq: Two times a day (BID) | ORAL | 3 refills | Status: DC

## 2019-09-22 MED ORDER — ESOMEPRAZOLE MAGNESIUM 40 MG PO CPDR: DELAYED_RELEASE_CAPSULE | ORAL | 11 refills | Status: DC

## 2019-09-22 NOTE — Patient Instructions (Addendum)
We have sent your prescriptions to your pharmacy  We have referred you to Lois Huxley , MD  If you are age 60 or older, your body mass index should be between 23-30. Your Body mass index is 38.84 kg/m. If this is out of the aforementioned range listed, please consider follow up with your Primary Care Provider.  If you are age 59 or younger, your body mass index should be between 19-25. Your Body mass index is 38.84 kg/m. If this is out of the aformentioned range listed, please consider follow up with your Primary Care Provider.   Follow up in 4 months  I appreciate the  opportunity to care for you  Thank You   Harl Bowie , MD

## 2019-09-22 NOTE — Progress Notes (Signed)
Kaitlyn Jennings    [id number]    10-Feb-1961  Primary Care Physician:Beane, Mirian Mo, PA  Referring Physician: Manfred Shirts, [address] [address] [place],  [place] [address]   Chief complaint:  Ulcerative colitis  HPI:  59 yr F Ulcerative Colitis here for follow-up visit.  She is doing well from GI standpoint denies any diarrhea or rectal bleeding.  She feels ulcerative colitis is in remission on 6-MP and sulfasalazine she is having 2-3 formed bowel movements daily. She continues to have severe fatigue with low energy and is gaining weight, continues to be under tremendous stress. Denies any abdominal pain, vomiting, melena or blood in stool.  GI history: Ulcerative colitis initially diagnosed in 1998 Allergic reaction to Remicade, was maintained on Humira with loss of clinical response 2016(elevated antibody level with undetectable drug trough), switched to [facility] May 2017, was discontinued by patient due to lack of adequate insurance coverage with high out-of-pocket deductible. Subsequently switched to [facility] in 2018, but she has missed multiple infusions and was getting it erratically. [place] higher out-of-pocket deductible, patient did not want to start it.   Colonoscopy December 25, 2016:Moderately active proctitis,12 mm polyp removed(sessile serrated adenoma)  Outpatient Encounter Medications as of 09/22/2019  Medication Sig  . albuterol (PROVENTIL) (2.5 MG/3ML) 0.083% nebulizer solution Take 2.5 mg by nebulization as needed.    Marland Kitchen buPROPion (WELLBUTRIN XL) 150 MG 24 hr tablet Take 1 tablet by mouth daily.  . Calcium Carbonate-Vitamin D (CALCIUM 500 + D PO) Take 1 capsule by mouth daily.    Marland Kitchen dicyclomine (BENTYL) 20 MG tablet TAKE 1 TABLET BY MOUTH 4 TIMES DAILY WITH MEALS AND  AT BEDTIME  . esomeprazole (NEXIUM) 40 MG capsule TAKE 1 CAPSULE BY MOUTH TWICE DAILY BEFORE A MEAL  . ferrous sulfate 325 (65 FE) MG tablet Take 325 mg by mouth daily with breakfast.    .  fluticasone (FLONASE) 50 MCG/ACT nasal spray Place 2 sprays into both nostrils daily.  . folic acid (FOLVITE) 1 MG tablet Take 1 tablet by mouth once daily  . Lactobacillus Rhamnosus, GG, (CULTURELLE PO) Take 1 capsule by mouth daily.  . mercaptopurine (PURINETHOL) 50 MG tablet TAKE 1 TABLET BY MOUTH DAILY ON AN EMPTY STOMACH ONE  HOUR  BEFORE  OR  2  HOURS  AFTER  MEALS  . mometasone-formoterol (DULERA) 100-5 MCG/ACT AERO Take 2 puffs first thing in am and then another 2 puffs about 12 hours later.  . mupirocin ointment (BACTROBAN) 2 % Apply topically as directed.  . ondansetron (ZOFRAN) 4 MG tablet Take 4 mg by mouth every 8 (eight) hours as needed for nausea.   . polyethylene glycol powder (GLYCOLAX/MIRALAX) powder Reported on 10/01/2015  . rosuvastatin (CRESTOR) 10 MG tablet Take 10 mg by mouth daily.    Marland Kitchen sulfaSALAzine (AZULFIDINE) 500 MG tablet Take 2 tablets by mouth twice daily  . venlafaxine XR (EFFEXOR-XR) 150 MG 24 hr capsule Take 150 mg by mouth daily with breakfast.  . [DISCONTINUED] Cholecalciferol (VITAMIN D) 1000 UNITS capsule Take 1,000 Units by mouth daily.    . [DISCONTINUED] sucralfate (CARAFATE) 1 g tablet Take 1 tablet by mouth twice daily (Patient not taking: Reported on 09/22/2019)  . [DISCONTINUED] vitamin B-12 (CYANOCOBALAMIN) 1000 MCG tablet Take 2 tablets by mouth once daily   . [DISCONTINUED] Vitamin D, Ergocalciferol, (DRISDOL) 50000 units CAPS capsule Take 50,000 Units by mouth once a week.   No facility-administered encounter  medications on file as of 09/22/2019.    Allergies as of 09/22/2019 - Review Complete 09/22/2019  Allergen Reaction Noted  . Molds & smuts  02/09/2013  . Remicade [infliximab] Other (See Comments) 08/26/2006    Past Medical History:  Diagnosis Date  . Anxiety   . 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 Asthma   . B12 deficiency   . Chronic gastritis   . GERD (gastroesophageal reflux disease)   . Hyperlipidemia   . IBS (irritable  bowel syndrome)   . Lupus ([place])    secondary to Rmicade  . Pancreatitis   . Rectal polyp 10/01/2015  . Ulcerative colitis     Past Surgical History:  Procedure Laterality Date  . BREAST CYST ASPIRATION     left  . ESOPHAGUS SURGERY    . SINUS SURGERY WITH INSTATRAK      Family History  Problem Relation Age of Onset  . Irritable bowel syndrome Father   . COPD Father   . Diabetes Maternal Grandfather   . Colon cancer Neg Hx   . Stomach cancer Neg Hx     Social History   Socioeconomic History  . Marital status: Married    Spouse name: Not on file  . Number of children: 0  . Years of education: Not on file  . Highest education level: Not on file  Occupational History  . Occupation: FLIGHT ATTENDANT  Tobacco Use  . Smoking status: Never Smoker  . Smokeless tobacco: Never Used  Substance and Sexual Activity  . Alcohol use: No    Alcohol/week: 0.0 standard drinks  . Drug use: No  . Sexual activity: Yes    Partners: Male    Birth control/protection: Pill  Other Topics Concern  . Not on file  Social History Narrative  . Not on file   Social Determinants of Health   Financial Resource Strain:   . Difficulty of Paying Living Expenses: Not on file  Food Insecurity:   . Worried About Charity fundraiser in the Last Year: Not on file  . Ran Out of Food in the Last Year: Not on file  Transportation Needs:   . Lack of Transportation (Medical): Not on file  . Lack of Transportation (Non-Medical): Not on file  Physical Activity:   . Days of Exercise per Week: Not on file  . Minutes of Exercise per Session: Not on file  Stress:   . Feeling of Stress : Not on file  Social Connections:   . Frequency of Communication with Friends and Family: Not on file  . Frequency of Social Gatherings with Friends and Family: Not on file  . Attends Religious Services: Not on file  . Active Member of Clubs or Organizations: Not on file  . Attends Archivist Meetings: Not on  file  . Marital Status: Not on file  Intimate Partner Violence:   . Fear of Current or Ex-Partner: Not on file  . Emotionally Abused: Not on file  . Physically Abused: Not on file  . Sexually Abused: Not on file      Review of systems: Review of Systems  Constitutional: Negative for fever and chills.  Positive for fatigue HENT: Positive for sinus problem Eyes: Negative for blurred vision.  Respiratory: Positive for cough, shortness of breath and wheezing.   Cardiovascular: Negative for chest pain and palpitations.  Gastrointestinal: as per HPI Genitourinary: Negative for dysuria, urgency, frequency and hematuria.  Musculoskeletal: Positive for myalgias, back pain  and joint pain.  Skin: Negative for itching and rash.  Neurological: Negative for dizziness, tremors, focal weakness, seizures and loss of consciousness.  Endo/Heme/Allergies: Positive for seasonal allergies.  Psychiatric/Behavioral: Negative for depression, suicidal ideas and hallucinations.  All other systems reviewed and are negative.   Physical Exam: Vitals:   09/22/19 0808  BP: 124/80  Pulse: 89  Temp: 97.8 F (36.6 C)  SpO2: 97%   Body mass index is 38.84 kg/m. Gen:      No acute distress HEENT:  EOMI, sclera anicteric Neck:     No masses; no thyromegaly Lungs:    Wheezes bilaterally; normal respiratory effort CV:         Regular rate and rhythm; no murmurs Abd:      + bowel sounds; soft, non-tender; no palpable masses, no distension Ext:    No edema; adequate peripheral perfusion Skin:      Warm and dry; no rash Neuro: alert and oriented x 3 Psych: normal mood and affect  Data Reviewed:  Reviewed labs, radiology imaging, old records and pertinent past GI work up   Assessment and Plan/Recommendations:  60 year old female with history of pancolonic ulcerative colitis and clinical remission for follow-up visit  Ulcerative pancolitis: Continue 6-MP and sulfasalazine with folic acid  Severe  fatigue: Check CMP, CBC, iron panel with ferritin, B12, folate and CRP  GERD: Continue Nexium and antireflux measures  Colonoscopy April 2018 with removal of greater than 1 cm sessile serrated adenoma and moderate active colitis.  Initial recall in 2 years but will switch it to 3 years given she is doing well from [facility] perspective and is in clinical remission.   Complains of severe low back pain Will send referral to Dr. Gardenia Phlegm for evaluation and management Send prescription for tramadol 50 mg twice daily as needed, follow-up with PMD for additional pain management  Return in 4- 6 months or sooner if needed  This visit required 40 minutes of patient care (this includes precharting, chart review, review of results, face-to-face time used for counseling as well as treatment plan and follow-up. The patient was provided an opportunity to ask questions and all were answered. The patient agreed with the plan and demonstrated an understanding of the instructions.  Damaris Hippo , MD    CC: Manfred Shirts, PA

## 2019-09-23 ENCOUNTER — Encounter: Payer: Self-pay | Admitting: Gastroenterology

## 2019-09-26 ENCOUNTER — Other Ambulatory Visit: Payer: Self-pay

## 2019-09-26 MED ORDER — CYANOCOBALAMIN 1000 MCG/ML IJ SOLN: INTRAMUSCULAR | 16 refills | Status: DC

## 2019-09-26 MED ORDER — NEEDLES & SYRINGES MISC: 16 refills | Status: DC

## 2019-09-30 ENCOUNTER — Other Ambulatory Visit: Payer: Self-pay | Admitting: Gastroenterology

## 2019-10-03 ENCOUNTER — Other Ambulatory Visit: Payer: Self-pay | Admitting: Gastroenterology

## 2020-01-16 ENCOUNTER — Other Ambulatory Visit: Payer: Self-pay

## 2020-01-16 MED ORDER — CYANOCOBALAMIN 1000 MCG/ML IJ SOLN: INTRAMUSCULAR | 5 refills | Status: DC

## 2020-01-20 ENCOUNTER — Telehealth: Payer: Self-pay | Admitting: Gastroenterology

## 2020-01-20 MED ORDER — MESALAMINE 1.2 G PO TBEC: 4.8000 g | DELAYED_RELEASE_TABLET | Freq: Every day | ORAL | 11 refills | Status: DC

## 2020-01-20 NOTE — Telephone Encounter (Signed)
Spoke with [facility] and was told that there is a back order on sulfasalazine is backordered.  They do not know how long the medication will be backordered.  Is there something else the patient can use?

## 2020-01-20 NOTE — Telephone Encounter (Signed)
Left message for patient to return call to discuss medications.  Will continue efforts.

## 2020-01-20 NOTE — Telephone Encounter (Signed)
Please check if her insurance covers Lialda 4 tablets daily instead of sulfasalazine.  Please send her refills for a year.  Schedule follow-up office visit, next available appointment.  Thank you

## 2020-01-20 NOTE — Telephone Encounter (Signed)
Pt states that her pharmacy told her that sulfasalazine was discontinued. She will like an alternative option.

## 2020-01-20 NOTE — Telephone Encounter (Signed)
Patient aware that Lialda 1.2g take 4 tablets daily (4.8g total) was sent to pharmacy to replace sulfasalazine.  Patient will call our office with any future issues that she might have with her medications.  She is scheduled to see Dr Silverio Decamp in July 2021 and prefers to keep that appointment.  Patient agreed to plan and verbalized understanding.  No further questions.

## 2020-01-25 ENCOUNTER — Ambulatory Visit: Payer: Medicare Other | Admitting: Gastroenterology

## 2020-02-17 ENCOUNTER — Other Ambulatory Visit: Payer: Self-pay | Admitting: Gastroenterology

## 2020-03-21 ENCOUNTER — Ambulatory Visit: Payer: Medicare Other | Admitting: Gastroenterology

## 2020-05-24 ENCOUNTER — Encounter (HOSPITAL_COMMUNITY): Payer: Self-pay

## 2020-05-24 ENCOUNTER — Emergency Department (HOSPITAL_COMMUNITY): Payer: Medicare Other

## 2020-05-24 ENCOUNTER — Inpatient Hospital Stay (HOSPITAL_COMMUNITY)
Admission: EM | Admit: 2020-05-24 | Discharge: 2020-05-30 | DRG: 208 | Disposition: A | Discharge status: Home Health | Payer: Medicare Other | Attending: Internal Medicine | Admitting: Internal Medicine

## 2020-05-24 ENCOUNTER — Emergency Department (HOSPITAL_COMMUNITY): Payer: Medicare Other | Admitting: Registered Nurse

## 2020-05-24 ENCOUNTER — Other Ambulatory Visit: Payer: Self-pay

## 2020-05-24 ENCOUNTER — Emergency Department (HOSPITAL_COMMUNITY): Payer: Medicare Other | Admitting: Anesthesiology

## 2020-05-24 DIAGNOSIS — J386 Stenosis of larynx: Secondary | ICD-10-CM | POA: Diagnosis present

## 2020-05-24 DIAGNOSIS — J0410 Acute tracheitis without obstruction: Secondary | ICD-10-CM | POA: Diagnosis not present

## 2020-05-24 DIAGNOSIS — Z7951 Long term (current) use of inhaled steroids: Secondary | ICD-10-CM

## 2020-05-24 DIAGNOSIS — J398 Other specified diseases of upper respiratory tract: Secondary | ICD-10-CM | POA: Diagnosis present

## 2020-05-24 DIAGNOSIS — R739 Hyperglycemia, unspecified: Secondary | ICD-10-CM | POA: Diagnosis present

## 2020-05-24 DIAGNOSIS — J9601 Acute respiratory failure with hypoxia: Secondary | ICD-10-CM | POA: Diagnosis present

## 2020-05-24 DIAGNOSIS — Z6841 Body Mass Index (BMI) 40.0 and over, adult: Secondary | ICD-10-CM

## 2020-05-24 DIAGNOSIS — K589 Irritable bowel syndrome without diarrhea: Secondary | ICD-10-CM | POA: Diagnosis present

## 2020-05-24 DIAGNOSIS — R0602 Shortness of breath: Secondary | ICD-10-CM | POA: Diagnosis not present

## 2020-05-24 DIAGNOSIS — K219 Gastro-esophageal reflux disease without esophagitis: Secondary | ICD-10-CM | POA: Diagnosis present

## 2020-05-24 DIAGNOSIS — J45909 Unspecified asthma, uncomplicated: Secondary | ICD-10-CM | POA: Diagnosis present

## 2020-05-24 DIAGNOSIS — Z79899 Other long term (current) drug therapy: Secondary | ICD-10-CM

## 2020-05-24 DIAGNOSIS — T380X5A Adverse effect of glucocorticoids and synthetic analogues, initial encounter: Secondary | ICD-10-CM | POA: Diagnosis present

## 2020-05-24 DIAGNOSIS — F329 Major depressive disorder, single episode, unspecified: Secondary | ICD-10-CM | POA: Diagnosis present

## 2020-05-24 DIAGNOSIS — E669 Obesity, unspecified: Secondary | ICD-10-CM | POA: Diagnosis present

## 2020-05-24 DIAGNOSIS — J384 Edema of larynx: Secondary | ICD-10-CM | POA: Diagnosis present

## 2020-05-24 DIAGNOSIS — Z20822 Contact with and (suspected) exposure to covid-19: Secondary | ICD-10-CM | POA: Diagnosis present

## 2020-05-24 DIAGNOSIS — J9600 Acute respiratory failure, unspecified whether with hypoxia or hypercapnia: Secondary | ICD-10-CM | POA: Diagnosis present

## 2020-05-24 DIAGNOSIS — L930 Discoid lupus erythematosus: Secondary | ICD-10-CM | POA: Diagnosis present

## 2020-05-24 DIAGNOSIS — E785 Hyperlipidemia, unspecified: Secondary | ICD-10-CM | POA: Diagnosis present

## 2020-05-24 DIAGNOSIS — Z789 Other specified health status: Secondary | ICD-10-CM

## 2020-05-24 DIAGNOSIS — Z0189 Encounter for other specified special examinations: Secondary | ICD-10-CM

## 2020-05-24 DIAGNOSIS — Z4659 Encounter for fitting and adjustment of other gastrointestinal appliance and device: Secondary | ICD-10-CM

## 2020-05-24 DIAGNOSIS — R061 Stridor: Secondary | ICD-10-CM

## 2020-05-24 LAB — COMPREHENSIVE METABOLIC PANEL
ALT: 31 U/L (ref 0–44)
AST: 22 U/L (ref 15–41)
Albumin: 4 g/dL (ref 3.5–5.0)
Alkaline Phosphatase: 101 U/L (ref 38–126)
Anion gap: 11 (ref 5–15)
BUN: 11 mg/dL (ref 6–20)
CO2: 23 mmol/L (ref 22–32)
Calcium: 9.1 mg/dL (ref 8.9–10.3)
Chloride: 106 mmol/L (ref 98–111)
Creatinine, Ser: 0.67 mg/dL (ref 0.44–1.00)
GFR calc Af Amer: >60 mL/min (ref >60)
GFR calc non Af Amer: >60 mL/min (ref >60)
Glucose, Bld: 126 mg/dL — ABNORMAL HIGH (ref 70–99)
Potassium: 3.6 mmol/L (ref 3.5–5.1)
Sodium: 140 mmol/L (ref 135–145)
Total Bilirubin: 0.3 mg/dL (ref 0.3–1.2)
Total Protein: 7.3 g/dL (ref 6.5–8.1)

## 2020-05-24 LAB — CBC WITH DIFFERENTIAL/PLATELET
Abs Immature Granulocytes: 0.09 10*3/uL — ABNORMAL HIGH (ref 0.00–0.07)
Basophils Absolute: 0.1 10*3/uL (ref 0.0–0.1)
Basophils Relative: 1 %
Eosinophils Absolute: 0.6 10*3/uL — ABNORMAL HIGH (ref 0.0–0.5)
Eosinophils Relative: 5 %
HCT: 39.6 % (ref 36.0–46.0)
Hemoglobin: 12.9 g/dL (ref 12.0–15.0)
Immature Granulocytes: 1 %
Lymphocytes Relative: 25 %
Lymphs Abs: 3 10*3/uL (ref 0.7–4.0)
MCH: 28.4 pg (ref 26.0–34.0)
MCHC: 32.6 g/dL (ref 30.0–36.0)
MCV: 87.2 fL (ref 80.0–100.0)
Monocytes Absolute: 0.6 10*3/uL (ref 0.1–1.0)
Monocytes Relative: 5 %
Neutro Abs: 7.7 10*3/uL (ref 1.7–7.7)
Neutrophils Relative %: 63 %
Platelets: 407 10*3/uL — ABNORMAL HIGH (ref 150–400)
RBC: 4.54 MIL/uL (ref 3.87–5.11)
RDW: 14.6 % (ref 11.5–15.5)
WBC: 12 10*3/uL — ABNORMAL HIGH (ref 4.0–10.5)
nRBC: 0 % (ref 0.0–0.2)

## 2020-05-24 LAB — SARS CORONAVIRUS 2 BY RT PCR (HOSPITAL ORDER, PERFORMED IN ~~LOC~~ HOSPITAL LAB): SARS Coronavirus 2: NEGATIVE

## 2020-05-24 MED ORDER — PROPOFOL 10 MG/ML IV BOLUS
INTRAVENOUS | Status: DC | PRN
  Administered 2020-05-24: 100 mg via INTRAVENOUS

## 2020-05-24 MED ORDER — RACEPINEPHRINE HCL 2.25 % IN NEBU
0.5000 mL | INHALATION_SOLUTION | Freq: Once | RESPIRATORY_TRACT | Status: AC
  Administered 2020-05-24: 0.5 mL via RESPIRATORY_TRACT
  Filled 2020-05-24: qty 0.5

## 2020-05-24 MED ORDER — PROPOFOL 1000 MG/100ML IV EMUL
INTRAVENOUS | Status: AC
  Administered 2020-05-24: 55.6 mg via INTRAVENOUS
  Filled 2020-05-24: qty 100

## 2020-05-24 MED ORDER — MIDAZOLAM HCL 2 MG/2ML IJ SOLN
INTRAMUSCULAR | Status: AC
  Filled 2020-05-24: qty 4

## 2020-05-24 MED ORDER — DEXAMETHASONE SODIUM PHOSPHATE 10 MG/ML IJ SOLN
10.0000 mg | Freq: Once | INTRAMUSCULAR | Status: AC
  Administered 2020-05-24: 10 mg via INTRAVENOUS
  Filled 2020-05-24: qty 1

## 2020-05-24 MED ORDER — IOHEXOL 300 MG/ML  SOLN
75.0000 mL | Freq: Once | INTRAMUSCULAR | Status: AC | PRN
  Administered 2020-05-24: 75 mL via INTRAVENOUS

## 2020-05-24 MED ORDER — MIDAZOLAM HCL 2 MG/2ML IJ SOLN
INTRAMUSCULAR | Status: AC
  Filled 2020-05-24: qty 2

## 2020-05-24 MED ORDER — PROPOFOL 10 MG/ML IV BOLUS
INTRAVENOUS | Status: AC
  Administered 2020-05-24: 50 ug/kg/min via INTRAVENOUS
  Filled 2020-05-24: qty 20

## 2020-05-24 MED ORDER — SUCCINYLCHOLINE CHLORIDE 20 MG/ML IJ SOLN
INTRAMUSCULAR | Status: DC | PRN
  Administered 2020-05-24: 200 mg via INTRAVENOUS

## 2020-05-24 MED ORDER — DIPHENHYDRAMINE HCL 50 MG/ML IJ SOLN
INTRAMUSCULAR | Status: AC
  Administered 2020-05-24: 25 mg via INTRAVENOUS
  Filled 2020-05-24: qty 1

## 2020-05-24 MED ORDER — FENTANYL CITRATE (PF) 100 MCG/2ML IJ SOLN
INTRAMUSCULAR | Status: AC
  Filled 2020-05-24: qty 2

## 2020-05-24 MED ORDER — ALBUTEROL SULFATE HFA 108 (90 BASE) MCG/ACT IN AERS
6.0000 | INHALATION_SPRAY | Freq: Once | RESPIRATORY_TRACT | Status: AC
  Administered 2020-05-24: 6 via RESPIRATORY_TRACT
  Filled 2020-05-24: qty 6.7

## 2020-05-24 MED ORDER — AEROCHAMBER Z-STAT PLUS/MEDIUM MISC
1.0000 | Freq: Once | Status: DC
  Filled 2020-05-24: qty 1

## 2020-05-24 MED ORDER — LACTATED RINGERS IV BOLUS
250.0000 mL | Freq: Once | INTRAVENOUS | Status: AC
  Administered 2020-05-24: 250 mL via INTRAVENOUS

## 2020-05-24 NOTE — Anesthesia Procedure Notes (Addendum)
Procedure Name: Intubation Date/Time: 05/24/2020 11:35 PM Performed by: Merlinda Frederick, MD Pre-anesthesia Checklist: Patient identified, Emergency Drugs available, Suction available, Patient being monitored and Timeout performed Patient Re-evaluated:Patient Re-evaluated prior to induction Oxygen Delivery Method: Ambu bag Preoxygenation: Pre-oxygenation with 100% oxygen Induction Type: IV induction Ventilation: Mask ventilation without difficulty Laryngoscope Size: Glidescope and 3 Grade View: Grade I Tube type: Oral Tube size: 7.0 mm Number of attempts: 1 Airway Equipment and Method: Stylet and Video-laryngoscopy Placement Confirmation: ETT inserted through vocal cords under direct vision,  breath sounds checked- equal and bilateral and CO2 detector Secured at: 23 cm Tube secured with: ETT holder. Dental Injury: Teeth and Oropharynx as per pre-operative assessment  Comments: Patient with subglottic resistance. 7.0 ETT able to be passed.

## 2020-05-24 NOTE — Consult Note (Signed)
Reason for Consult: Respiratory distress and stridor Referring Physician: Dr. Langston Masker  HPI:  Kaitlyn Jennings is an 59 y.o. female who presents to the [facility] ER today c/o shortness of breath. She has a past medical history of ulcerative colitis, upper airway cough syndrome, irritable larynx syndrome, who presents today for evaluation of shortness of breath x 1.5 weeks.  She has been seen by her PCP and given steroids, cough syrup, antibiotics and nebulizers without significant relief.  She reports that 2 days ago she started feeling swelling in her throat.  She states that it has been getting worse and worse.  She has had difficulty sleeping due to her increased work of breathing. Her ER neck CT scan shows marked narrowing of the glottis and the subglottis. Her COVID test is negative.  Past Medical History:  Diagnosis Date  . Anxiety   . 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 Asthma   . B12 deficiency   . Chronic gastritis   . GERD (gastroesophageal reflux disease)   . Hyperlipidemia   . IBS (irritable bowel syndrome)   . Lupus ([place])    secondary to Rmicade  . Pancreatitis   . Rectal polyp 10/01/2015  . Ulcerative colitis     Past Surgical History:  Procedure Laterality Date  . BREAST CYST ASPIRATION     left  . ESOPHAGUS SURGERY    . SINUS SURGERY WITH INSTATRAK      Family History  Problem Relation Age of Onset  . Irritable bowel syndrome Father   . COPD Father   . Diabetes Maternal Grandfather   . Colon cancer Neg Hx   . Stomach cancer Neg Hx     Social History:  reports that she has never smoked. She has never used smokeless tobacco. She reports that she does not drink alcohol and does not use drugs.  Allergies:  Allergies  Allergen Reactions  . Amoxicillin Other (See Comments)    Abdominal pain Abdominal pain   . Molds & Smuts   . Oseltamivir     N/V stomach cramps   . Remicade [Infliximab] Other (See Comments)    REACTION: lupus, joint pain    Prior to Admission  medications   Medication Sig Start Date End Date Taking? Authorizing Provider  albuterol (PROVENTIL) (2.5 MG/3ML) 0.083% nebulizer solution Take 2.5 mg by nebulization as needed.     Yes [provider]  azithromycin (ZITHROMAX) 250 MG tablet Take 250 mg by mouth as directed. 2 TABLETS ON DAY 1, THEN 1 TABLET ON DAYS 2-5 05/21/20  Yes [provider]  buPROPion (WELLBUTRIN XL) 150 MG 24 hr tablet Take 1 tablet by mouth daily. 12/14/17  Yes [provider]  Calcium Carbonate-Vitamin D (CALCIUM 500 + D PO) Take 1 capsule by mouth daily.     Yes [provider]  cyanocobalamin (,VITAMIN B-12,) 1000 MCG/ML injection inject 1 ml every month Dispense with syringes and needles 01/16/20  Yes Nandigam, Kavitha V, MD  dicyclomine (BENTYL) 20 MG tablet TAKE 1 TABLET BY MOUTH 4 TIMES DAILY WITH MEALS AND  AT BEDTIME 01/13/18  Yes Nandigam, Kavitha V, MD  esomeprazole (NEXIUM) 40 MG capsule TAKE 1 CAPSULE BY MOUTH TWICE DAILY BEFORE A MEAL Patient taking differently: Take 40 mg by mouth daily. TAKE 1 CAPSULE BY MOUTH TWICE DAILY BEFORE A MEAL 09/22/19  Yes Nandigam, Kavitha V, MD  ferrous sulfate 325 (65 FE) MG tablet Take 325 mg by mouth daily with breakfast.  Yes [provider]  fluticasone (FLONASE) 50 MCG/ACT nasal spray Place 2 sprays into both nostrils daily.   Yes [provider]  folic acid (FOLVITE) 1 MG tablet Take 1 tablet by mouth once daily 02/17/20  Yes Nandigam, Kavitha V, MD  Lactobacillus Rhamnosus, GG, (CULTURELLE PO) Take 1 capsule by mouth daily.   Yes [provider]  mercaptopurine (PURINETHOL) 50 MG tablet TAKE 1 TABLET BY MOUTH DAILY ON AN EMPTY STOMACH ONE  HOUR  BEFORE  OR  2  HOURS  AFTER  MEALS 09/22/19  Yes Nandigam, Venia Minks, MD  mesalamine (LIALDA) 1.2 g EC tablet Take 4 tablets (4.8 g total) by mouth daily with breakfast. 01/20/20  Yes Nandigam, Venia Minks, MD  mometasone-formoterol (DULERA) 100-5 MCG/ACT AERO Take 2 puffs  first thing in am and then another 2 puffs about 12 hours later. 04/05/18  Yes Tanda Rockers, MD  mupirocin ointment (BACTROBAN) 2 % Apply topically as directed. 12/14/17  Yes [provider]  Needles & Syringes MISC Use to inject B12 weekly x 4 then every month 09/26/19  Yes Nandigam, Venia Minks, MD  ondansetron (ZOFRAN) 4 MG tablet Take 4 mg by mouth every 8 (eight) hours as needed for nausea.    Yes [provider]  polyethylene glycol powder (GLYCOLAX/MIRALAX) powder Reported on 10/01/2015 02/18/11  Yes Lafayette Dragon, MD  promethazine-dextromethorphan (PROMETHAZINE-DM) 6.25-15 MG/5ML syrup Take 5 mLs by mouth 4 (four) times daily as needed. 05/16/20  Yes [provider]  rosuvastatin (CRESTOR) 10 MG tablet Take 10 mg by mouth daily.     Yes [provider]  sucralfate (CARAFATE) 1 g tablet Take 1 tablet by mouth twice daily 10/03/19  Yes Nandigam, Venia Minks, MD  traMADol (ULTRAM) 50 MG tablet Take 1 tablet (50 mg total) by mouth 2 (two) times daily as needed. 09/22/19  Yes Mauri Pole, MD  venlafaxine XR (EFFEXOR-XR) 150 MG 24 hr capsule Take 150 mg by mouth daily with breakfast.   Yes [provider]     Results for orders placed or performed during the hospital encounter of 05/24/20 (from the past 48 hour(s))  SARS Coronavirus 2 by RT PCR (hospital order, performed in [facility] hospital lab) Nasopharyngeal Nasopharyngeal Swab     Status: None   Collection Time: 05/24/20  8:14 PM   Specimen: Nasopharyngeal Swab  Result Value Ref Range   SARS Coronavirus 2 NEGATIVE NEGATIVE    Comment: (NOTE) SARS-CoV-2 target nucleic acids are NOT DETECTED.  The SARS-CoV-2 RNA is generally detectable in upper and lower respiratory specimens during the acute phase of infection. The lowest concentration of SARS-CoV-2 viral copies this assay can detect is 250 copies / mL. A negative result does not preclude SARS-CoV-2 infection and should not be used as the  sole basis for treatment or other patient management decisions.  A negative result may occur with improper specimen collection / handling, submission of specimen other than nasopharyngeal swab, presence of viral mutation(s) within the areas targeted by this assay, and inadequate number of viral copies (<250 copies / mL). A negative result must be combined with clinical observations, patient history, and epidemiological information.  Fact Sheet for Patients:   StrictlyIdeas.no  Fact Sheet for Healthcare Providers: BankingDealers.co.za  This test is not yet approved or  cleared by the [place] FDA and has been authorized for detection and/or diagnosis of SARS-CoV-2 by FDA under an Emergency Use Authorization (EUA).  This EUA will remain in effect (  meaning this test can be used) for the duration of the COVID-19 declaration under Section 564(b)(1) of the Act, 21 U.S.C. section 360bbb-3(b)(1), unless the authorization is terminated or revoked sooner.  Performed at [facility], [place] [address] [place], [place] [address]   CBC with Differential     Status: Abnormal   Collection Time: 05/24/20  8:45 PM  Result Value Ref Range   WBC 12.0 (H) 4.0 - 10.5 K/uL   RBC 4.54 3.87 - 5.11 MIL/uL   Hemoglobin 12.9 12.0 - 15.0 g/dL   HCT 39.6 36 - 46 %   MCV 87.2 80.0 - 100.0 fL   MCH 28.4 26.0 - 34.0 pg   MCHC 32.6 30.0 - 36.0 g/dL   RDW 14.6 11.5 - 15.5 %   Platelets 407 (H) 150 - 400 K/uL   nRBC 0.0 0.0 - 0.2 %   Neutrophils Relative % 63 %   Neutro Abs 7.7 1.7 - 7.7 K/uL   Lymphocytes Relative 25 %   Lymphs Abs 3.0 0.7 - 4.0 K/uL   Monocytes Relative 5 %   Monocytes Absolute 0.6 0 - 1 K/uL   Eosinophils Relative 5 %   Eosinophils Absolute 0.6 (H) 0 - 0 K/uL   Basophils Relative 1 %   Basophils Absolute 0.1 0 - 0 K/uL   Immature Granulocytes 1 %   Abs Immature Granulocytes 0.09 (H) 0.00 - 0.07 K/uL    Comment:  Performed at [facility], [place] [address], [place], [place] [address]  Comprehensive metabolic panel     Status: Abnormal   Collection Time: 05/24/20  8:45 PM  Result Value Ref Range   Sodium 140 135 - 145 mmol/L   Potassium 3.6 3.5 - 5.1 mmol/L   Chloride 106 98 - 111 mmol/L   CO2 23 22 - 32 mmol/L   Glucose, Bld 126 (H) 70 - 99 mg/dL    Comment: Glucose reference range applies only to samples taken after fasting for at least 8 hours.   BUN 11 6 - 20 mg/dL   Creatinine, Ser 0.67 0.44 - 1.00 mg/dL   Calcium 9.1 8.9 - 10.3 mg/dL   Total Protein 7.3 6.5 - 8.1 g/dL   Albumin 4.0 3.5 - 5.0 g/dL   AST 22 15 - 41 U/L   ALT 31 0 - 44 U/L   Alkaline Phosphatase 101 38 - 126 U/L   Total Bilirubin 0.3 0.3 - 1.2 mg/dL   GFR calc non Af Amer >60 >60 mL/min   GFR calc Af Amer >60 >60 mL/min   Anion gap 11 5 - 15    Comment: Performed at [facility], [place] [address] [place], [place] [address]    DG Chest 2 View  Result Date: 05/24/2020 CLINICAL DATA:  Dyspnea EXAM: CHEST - 2 VIEW COMPARISON:  04/05/2018 FINDINGS: The heart size and mediastinal contours are within normal limits. Both lungs are clear. The visualized skeletal structures are unremarkable. IMPRESSION: No active cardiopulmonary disease. Electronically Signed   By: Fidela Salisbury MD   On: 05/24/2020 17:59   CT Soft Tissue Neck W Contrast  Result Date: 05/24/2020 CLINICAL DATA:  Stridor EXAM: CT NECK WITH CONTRAST TECHNIQUE: Multidetector CT imaging of the neck was performed using the standard protocol following the bolus administration of intravenous contrast. CONTRAST:  30m OMNIPAQUE IOHEXOL 300 MG/ML  SOLN COMPARISON:  None. FINDINGS: PHARYNX AND LARYNX: There is marked narrowing of the glottic and infraglottic laryngeal airway with minimum  diameter of 4 mm. The pharynx is normal. Normal epiglottis. No retropharyngeal abnormality. SALIVARY GLANDS: Normal parotid, submandibular and sublingual  glands. THYROID: Normal. LYMPH NODES: No enlarged or abnormal density lymph nodes. VASCULAR: Major cervical vessels are patent. LIMITED INTRACRANIAL: Normal. VISUALIZED ORBITS: Normal. MASTOIDS AND VISUALIZED PARANASAL SINUSES: No fluid levels or advanced mucosal thickening. No mastoid effusion. SKELETON: No bony spinal canal stenosis. No lytic or blastic lesions. UPPER CHEST: There is debris in the upper trachea. Lung apices are clear. OTHER: None. IMPRESSION: 1. Marked narrowing of the glottic and infraglottic laryngeal airway with minimum diameter of 4 mm. 2. Debris in the upper trachea. Electronically Signed   By: Ulyses Jarred M.D.   On: 05/24/2020 21:58   Review of systems Unable to obtain. Pt is intubated in the ER.  Blood pressure 140/73, pulse 93, temperature 98.7 F (37.1 C), temperature source Oral, resp. rate 16, height 5' 9"  (1.753 m), weight 111.1 kg, last menstrual period 11/12/2016, SpO2 95 %. General appearance: alert, cooperative and mild distress  Eyes: Pupils are equal, round, reactive to light. Extraocular motion is intact.  Ears: Examination of the ears shows normal auricles and external auditory canals bilaterally.  Nose: Nasal examination shows normal mucosa, septum, turbinates.  Face: Facial examination shows no asymmetry. Palpation of the face elicit no significant tenderness.  Mouth: Oral cavity examination shows no mucosal lacerations. No significant trismus is noted.  Neck: Palpation of the neck reveals no lymphadenopathy or mass. The trachea is midline. The thyroid is not significantly enlarged.  Neuro: Cranial nerves 2-12 are all grossly in tact.  Procedure:  Flexible Fiberoptic Laryngoscopy Anesthesia: None. Indication: Stridor and respiratory distress Description: Risks, benefits, and alternatives of flexible endoscopy were explained to the patient. Specific mention was made of the risk of throat numbness with difficulty swallowing, possible bleeding from the nose  and mouth, and pain from the procedure.  The patient gave oral consent to proceed. The flexible scope was inserted into the right nasal cavity and advanced towards the nasopharynx.  Visualized mucosa over the turbinates and septum were normal.  The nasopharynx was clear.  Oropharyngeal walls were symmetric and mobile without lesion, mass, or edema.  Hypopharynx was also without  lesion or edema.  Larynx was mobile without lesions.  No lesions or asymmetry in the supraglottic larynx.  Arytenoid mucosa was normal. Both vocal cords were mobile without glottic obstruction.  True vocal folds were pale yellow and without mass or lesion.    Assessment/Plan: Stridor and respiratory distress, likely secondary to subglottic edema. She is intubated with a 7-0 ET tube. - Pt's vocal cords are noted to be normal without any mass or lesion. - No supraglottic obstruction. - Admit to ICU. - IV steroid - Decision on extubation will depend on reduction of her subglottic edema/ cuff leak. - Will follow  Imir Brumbach W Jaelynne Hockley 05/24/2020, 11:41 PM

## 2020-05-24 NOTE — ED Provider Notes (Signed)
[place] DEPT Provider Note   CSN: [id number] Arrival date & time: 05/24/20  2774     History Chief Complaint  Patient presents with  . Shortness of Breath    Kaitlyn Jennings is a 59 y.o. female with a past medical history of ulcerative colitis, upper airway cough syndrome, who presents today for evaluation of shortness of breath however a week and a half with cough.  She has been seen by her PCP and given steroids, cough syrup, antibiotics and nebulizers without significant relief.  She reports that 2 days ago she started feeling swelling in her throat.  She states that it has been getting worse and worse.  She has had difficulty sleeping due to her increased work of breathing.  According to her note from [facility] on 05/16/2020 when she had a phone visit, she had complained of chills, dyspnea, frontal headache, myalgias, nonproductive cough, purulent rhinorrhea, sneezing, wheezing, and chest congestion.  Her symptoms started on 05/10/2020.  In that note it was reported that while patient has not traveled recently her brother and daughter-in-law were in [place] and her husband had then been around them.  Her husband got ill a few days before she did.  She did have a fever however that resolved.  She reports loss of taste and smell.  She had a Covid test at [facility] which was negative.  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     HPI     Past Medical History:  Diagnosis Date  . Anxiety   . 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 Asthma   . B12 deficiency   . Chronic gastritis   . GERD (gastroesophageal reflux disease)   . Hyperlipidemia   . IBS (irritable bowel syndrome)   . Lupus ([place])    secondary to Rmicade  . Pancreatitis   . Rectal polyp 10/01/2015  . Ulcerative colitis     Patient Active Problem List   Diagnosis Date  Noted  . Upper airway cough syndrome 04/05/2018  . B12 DEFICIENCY 03/26/2010  . GASTRITIS 02/04/2008  . ABDOMINAL PAIN, EPIGASTRIC 02/04/2008  . ANXIETY 09/25/2007  . Ulcerative colitis ([place]) 09/25/2007  . IRRITABLE BOWEL SYNDROME 09/25/2007  . ARTHRITIS 09/25/2007  . PANCREATITIS, ACUTE, HX OF 09/25/2007    Past Surgical History:  Procedure Laterality Date  . BREAST CYST ASPIRATION     left  . ESOPHAGUS SURGERY    . SINUS SURGERY WITH INSTATRAK       OB History   No obstetric history on file.     Family History  Problem Relation Age of Onset  . Irritable bowel syndrome Father   . COPD Father   . Diabetes Maternal Grandfather   . Colon cancer Neg Hx   . Stomach cancer Neg Hx     Social History   Tobacco Use  . Smoking status: Never Smoker  . Smokeless tobacco: Never Used  Vaping Use  . Vaping Use: Never used  Substance Use Topics  . Alcohol use: No    Alcohol/week: 0.0 standard drinks  . Drug use: No    Home Medications Prior to Admission medications   Medication Sig Start Date End Date Taking? Authorizing Provider  albuterol (PROVENTIL) (2.5 MG/3ML) 0.083% nebulizer solution Take 2.5 mg by nebulization as needed.     Yes [provider]  azithromycin (ZITHROMAX) 250 MG tablet Take 250 mg by mouth as directed. 2 TABLETS ON DAY 1, THEN 1 TABLET ON DAYS 2-5 05/21/20  Yes [provider]  buPROPion (WELLBUTRIN XL) 150 MG 24 hr tablet Take 1 tablet by mouth daily. 12/14/17  Yes [provider]  Calcium Carbonate-Vitamin D (CALCIUM 500 + D PO) Take 1 capsule by mouth daily.     Yes [provider]  cyanocobalamin (,VITAMIN B-12,) 1000 MCG/ML injection inject 1 ml every month Dispense with syringes and needles 01/16/20  Yes Nandigam, Kavitha V, MD  dicyclomine (BENTYL) 20 MG tablet TAKE 1 TABLET BY MOUTH 4 TIMES DAILY WITH MEALS AND  AT BEDTIME 01/13/18  Yes Nandigam, Kavitha V, MD  esomeprazole (NEXIUM) 40 MG capsule TAKE 1 CAPSULE BY  MOUTH TWICE DAILY BEFORE A MEAL Patient taking differently: Take 40 mg by mouth daily. TAKE 1 CAPSULE BY MOUTH TWICE DAILY BEFORE A MEAL 09/22/19  Yes Nandigam, Venia Minks, MD  ferrous sulfate 325 (65 FE) MG tablet Take 325 mg by mouth daily with breakfast.     Yes [provider]  fluticasone (FLONASE) 50 MCG/ACT nasal spray Place 2 sprays into both nostrils daily.   Yes [provider]  folic acid (FOLVITE) 1 MG tablet Take 1 tablet by mouth once daily 02/17/20  Yes Nandigam, Kavitha V, MD  Lactobacillus Rhamnosus, GG, (CULTURELLE PO) Take 1 capsule by mouth daily.   Yes [provider]  mercaptopurine (PURINETHOL) 50 MG tablet TAKE 1 TABLET BY MOUTH DAILY ON AN EMPTY STOMACH ONE  HOUR  BEFORE  OR  2  HOURS  AFTER  MEALS 09/22/19  Yes Nandigam, Venia Minks, MD  mesalamine (LIALDA) 1.2 g EC tablet Take 4 tablets (4.8 g total) by mouth daily with breakfast. 01/20/20  Yes Nandigam, Venia Minks, MD  mometasone-formoterol (DULERA) 100-5 MCG/ACT AERO Take 2 puffs first thing in am and then another 2 puffs about 12 hours later. 04/05/18  Yes Tanda Rockers, MD  mupirocin ointment (BACTROBAN) 2 % Apply topically as directed. 12/14/17  Yes [provider]  Needles & Syringes MISC Use to inject B12 weekly x 4 then every month 09/26/19  Yes Nandigam, Venia Minks, MD  ondansetron (ZOFRAN) 4 MG tablet Take 4 mg by mouth every 8 (eight) hours as needed for nausea.    Yes [provider]  polyethylene glycol powder (GLYCOLAX/MIRALAX) powder Reported on 10/01/2015 02/18/11  Yes Lafayette Dragon, MD  promethazine-dextromethorphan (PROMETHAZINE-DM) 6.25-15 MG/5ML syrup Take 5 mLs by mouth 4 (four) times daily as needed. 05/16/20  Yes [provider]  rosuvastatin (CRESTOR) 10 MG tablet Take 10 mg by mouth daily.     Yes [provider]  sucralfate (CARAFATE) 1 g tablet Take 1 tablet by mouth twice daily 10/03/19  Yes Nandigam, Venia Minks, MD  traMADol (ULTRAM) 50 MG tablet  Take 1 tablet (50 mg total) by mouth 2 (two) times daily as needed. 09/22/19  Yes Nandigam, Venia Minks, MD  venlafaxine XR (EFFEXOR-XR) 150 MG 24 hr capsule Take 150 mg by mouth daily with breakfast.   Yes [provider]    Allergies    Amoxicillin, Molds & smuts, Oseltamivir, and Remicade [infliximab]  Review of Systems   Review of Systems  Physical Exam Updated Vital Signs BP 140/73 (BP Location: Right Arm)   Pulse 93   Temp 98.7 F (37.1 C) (Oral)   Resp 16   Ht 5' 9"  (1.753 m)   Wt 111.1 kg  LMP 11/12/2016 (Approximate)   SpO2 95%   BMI 36.18 kg/m   Physical Exam Vitals and nursing note reviewed.  Constitutional:      General: She is in acute distress.     Appearance: She is not diaphoretic.  HENT:     Head: Normocephalic and atraumatic.     Mouth/Throat:     Mouth: Mucous membranes are moist.  Eyes:     General: No scleral icterus.       Right eye: No discharge.        Left eye: No discharge.     Conjunctiva/sclera: Conjunctivae normal.  Cardiovascular:     Rate and Rhythm: Normal rate and regular rhythm.  Pulmonary:     Effort: Accessory muscle usage present. No respiratory distress.     Breath sounds: Stridor present.     Comments: Increased effort, accessory muscle usage.  Obvious stridor audible in the room.  Chest:     Chest wall: No tenderness.  Abdominal:     General: There is no distension.  Musculoskeletal:        General: No deformity.     Cervical back: Normal range of motion and neck supple.     Right lower leg: No tenderness. No edema.     Left lower leg: No tenderness. No edema.  Skin:    General: Skin is warm and dry.  Neurological:     General: No focal deficit present.     Mental Status: She is alert.     Motor: No abnormal muscle tone.  Psychiatric:        Mood and Affect: Mood normal.        Behavior: Behavior normal.     ED Results / Procedures / Treatments   Labs (all labs ordered are listed, but only abnormal  results are displayed) Labs Reviewed  CBC WITH DIFFERENTIAL/PLATELET - Abnormal; Notable for the following components:      Result Value   WBC 12.0 (*)    Platelets 407 (*)    Eosinophils Absolute 0.6 (*)    Abs Immature Granulocytes 0.09 (*)    All other components within normal limits  COMPREHENSIVE METABOLIC PANEL - Abnormal; Notable for the following components:   Glucose, Bld 126 (*)    All other components within normal limits  SARS CORONAVIRUS 2 BY RT PCR (HOSPITAL ORDER, [facility])  RESPIRATORY PANEL BY PCR    EKG EKG Interpretation  Date/Time:  Thursday May 24 2020 21:15:20 EDT Ventricular Rate:  94 PR Interval:    QRS Duration: 104 QT Interval:  372 QTC Calculation: 466 R Axis:   66 Text Interpretation: Sinus rhythm Ventricular premature complex Low voltage, precordial leads No STEMI Confirmed by Octaviano Glow [phone]) on 05/24/2020 9:34:54 PM   Radiology DG Chest 2 View  Result Date: 05/24/2020 CLINICAL DATA:  Dyspnea EXAM: CHEST - 2 VIEW COMPARISON:  04/05/2018 FINDINGS: The heart size and mediastinal contours are within normal limits. Both lungs are clear. The visualized skeletal structures are unremarkable. IMPRESSION: No active cardiopulmonary disease. Electronically Signed   By: Fidela Salisbury MD   On: 05/24/2020 17:59   CT Soft Tissue Neck W Contrast  Result Date: 05/24/2020 CLINICAL DATA:  Stridor EXAM: CT NECK WITH CONTRAST TECHNIQUE: Multidetector CT imaging of the neck was performed using the standard protocol following the bolus administration of intravenous contrast. CONTRAST:  45m OMNIPAQUE IOHEXOL 300 MG/ML  SOLN COMPARISON:  None. FINDINGS: PHARYNX AND LARYNX: There is marked  narrowing of the glottic and infraglottic laryngeal airway with minimum diameter of 4 mm. The pharynx is normal. Normal epiglottis. No retropharyngeal abnormality. SALIVARY GLANDS: Normal parotid, submandibular and sublingual glands. THYROID:  Normal. LYMPH NODES: No enlarged or abnormal density lymph nodes. VASCULAR: Major cervical vessels are patent. LIMITED INTRACRANIAL: Normal. VISUALIZED ORBITS: Normal. MASTOIDS AND VISUALIZED PARANASAL SINUSES: No fluid levels or advanced mucosal thickening. No mastoid effusion. SKELETON: No bony spinal canal stenosis. No lytic or blastic lesions. UPPER CHEST: There is debris in the upper trachea. Lung apices are clear. OTHER: None. IMPRESSION: 1. Marked narrowing of the glottic and infraglottic laryngeal airway with minimum diameter of 4 mm. 2. Debris in the upper trachea. Electronically Signed   By: Ulyses Jarred M.D.   On: 05/24/2020 21:58    Procedures .Critical Care Performed by: Lorin Glass, PA-C Authorized by: Lorin Glass, PA-C   Critical care provider statement:    Critical care time (minutes):  75   Critical care time was exclusive of:  Separately billable procedures and treating other patients and teaching time   Critical care was necessary to treat or prevent imminent or life-threatening deterioration of the following conditions:  Respiratory failure (Airway obstruction)   Critical care was time spent personally by me on the following activities:  Discussions with consultants, evaluation of patient's response to treatment, examination of patient, ordering and performing treatments and interventions, ordering and review of laboratory studies, ordering and review of radiographic studies, pulse oximetry, re-evaluation of patient's condition, obtaining history from patient or surrogate and review of old charts   (including critical care time)  Medications Ordered in ED Medications  aerochamber Z-Stat Plus/medium 1 each (0 each Other Hold 05/24/20 2053)  fentaNYL (SUBLIMAZE) 100 MCG/2ML injection (has no administration in time range)  midazolam (VERSED) 2 MG/2ML injection (has no administration in time range)  propofol (DIPRIVAN) 1000 MG/100ML infusion (has no  administration in time range)  diphenhydrAMINE (BENADRYL) 50 MG/ML injection (has no administration in time range)  albuterol (VENTOLIN HFA) 108 (90 Base) MCG/ACT inhaler 6 puff (6 puffs Inhalation Given 05/24/20 2045)  dexamethasone (DECADRON) injection 10 mg (10 mg Intravenous Given 05/24/20 2045)  iohexol (OMNIPAQUE) 300 MG/ML solution 75 mL (75 mLs Intravenous Contrast Given 05/24/20 2134)  lactated ringers bolus 250 mL (250 mLs Intravenous New Bag/Given 05/24/20 2214)  Racepinephrine HCl 2.25 % nebulizer solution 0.5 mL (0.5 mLs Nebulization Given 05/24/20 2220)  propofol (DIPRIVAN) 10 mg/mL bolus/IV push (  Override pull for Anesthesia 05/24/20 2342)    ED Course  I have reviewed the triage vital signs and the nursing notes.  Pertinent labs & imaging results that were available during my care of the patient were reviewed by me and considered in my medical decision making (see chart for details).  Clinical Course as of May 26 15  Thu May 24, 2020  2015 Asked RN to move patient to the back.    [EH]  2015 I called radiology, Lower laryngeal airway is narrow, at level of vocal cords and around, drops down to 80m diameter He says it doesn't look like a mass.   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  Patient reports of worsening for 3 days.  She also reports that she is unvaccinated for Covid, and has lost the taste and smell  for the past 2 weeks.  She is a progressively worsening shortness of breath and cough for 2 weeks.  She has been on steroids, and also tried nebulizers at home, but feels like it is not helping her.  On exam she is able to speak in full sentences.  She is 95% on room air.  She is sitting upright.  She does have what sounds like inspiratory  stridor, but no expiratory stridor.  She has some diminished breath sounds bilaterally but no audible wheezing.  It is not clear to me whether this is true laryngeal edema or airway swelling, or else a flareup of her chronic condition.  She says she is had similar episodes in the past which normally respond to albuterol.  I am unaware of any form of laryngeal edema that would respond quickly to albuterol.   [MT]  2053 She is afebrile nontoxic-appearing. I have a low suspicion for tracheal bronchitis or deep space infection.  She has no other signs or symptoms of anaphylaxis.  I spoke to Dr. Benjamine Mola from ENT on the phone, who reviewed her records, and advised that we proceed with a CT scan of soft tissue of the neck to look for evidence of airway obstruction.  I have also placed a consult to critical care and will ask them come down and evaluate the patient regarding this possible stridor.   [MT]  2054 Covid test is pending, she will get decadron and nebulizers once she has a result.   [MT]  2201 Patient is reevaluated, her oxygen is now down to 91% on room air. Placed on 2 L.   [EH]  2202 PHARYNX AND LARYNX: There is marked narrowing of the glottic and infraglottic laryngeal airway with minimum diameter of 4 mm. The pharynx is normal. Normal epiglottis. No retropharyngeal abnormality.   [MT]  2206 Dr. Langston Masker spoke with Dr. Benjamine Mola, I paged critical care.    [EH]  2207 Spoke with Marcie Bal CRNA with anesthesia.  Dr. Benjamine Mola is enroute.    [EH]  2217 Paged critical care again   [EH]  2233 I spoke with Montey Hora from critical care.  They are in a code, will call back.    [EH]  2236 I spoke with Dr. Lucile Shutters with critical care, Dr. Langston Masker to speak with him also.     [EH]  2244 Critical ca   [EH]  2250 I spoke to cirtical care attending who is very concerned about level of narrowing and agreed to move to intubation.  This is given the fact that the patient is mildly hypoxic now on 2L [place] and reports she is  feeling "tired" breathing.  She requires conscious effort for inspiration and cannot sleep.   [MT]  2250 Patient intubated by anesthesia team, now on versed and fentanyl, will need ICU admission.   [MT]  2339 Patient has been intubated by anesthesia.    [EH]  Fri May 25, 2020  0012 ICU team has been notified of patient's intubation, and they've been requested to assume care.  Patient's husband was also present prior to intubation and fully informed of her situation.   [MT]    Clinical Course User Index [EH] Lorin Glass, PA-C [MT] Langston Masker Carola Rhine, MD   MDM Rules/Calculators/A&P                         Patient is a 59 year old woman who presents today for evaluation of about 10 days of multiple  symptoms.  She tested negative for Covid, and here tested negative.  Chest x-ray is clear.  On exam she has obvious stridor with increased work of breathing and accessory muscle usage.  I initially evaluated her she was 95% on room air, however while in the ER she began to desaturate down to 90 to 91% on room air, this rebounded with 2 L nasal cannula.  CBC shows mild leukocytosis at 12.0, she has been on steroids prior to arrival.  CMP shows hyperglycemia at 126.  After discussions with Dr. Benjamine Mola of ENT CT neck was obtained.  This shows marked narrowing of the glottic and infraglottic laryngeal airway with a minimum diameter of 4 mm with normal pharynx and epiglottis.  Multiple conversations with specialist including PCCM, ENT, and anesthesia were had by both myself and Dr. Langston Masker.    Patient was intubated by anesthesia.   Patient will be admitted by PCCM.    Note: Portions of this report may have been transcribed using voice recognition software. Every effort was made to ensure accuracy; however, inadvertent computerized transcription errors may be present   Final Clinical Impression(s) / ED Diagnoses Final diagnoses:  Stridor  Anticipated difficulty with intubation    Rx / DC  Orders ED Discharge Orders    None       Ollen Gross 05/25/20 0025    Wyvonnia Dusky, MD 05/25/20 [phone]

## 2020-05-24 NOTE — ED Triage Notes (Signed)
Patient c/o SOB x 1 1/2 weeks. Cough, SInus issues. Patient states she has had steroids, cough syrup, nebulizer, and antibiotics prescribed.

## 2020-05-24 NOTE — Anesthesia Preprocedure Evaluation (Signed)
Anesthesia Evaluation  Patient identified by MRN, date of birth, ID band Patient awake    Reviewed: Allergy & Precautions, Patient's Chart, lab work & pertinent test results  Airway Mallampati: III  TM Distance: >3 FB Neck ROM: Full    Dental no notable dental hx.    Pulmonary asthma ,    Pulmonary exam normal   + stridor     Cardiovascular negative cardio ROS   Rhythm:Regular Rate:Tachycardia     Neuro/Psych Anxiety negative neurological ROS     GI/Hepatic Neg liver ROS, PUD, GERD  Controlled,  Endo/Other  obesity  Renal/GU negative Renal ROS     Musculoskeletal  (+) Arthritis ,   Abdominal (+) + obese,   Peds negative pediatric ROS (+)  Hematology   Anesthesia Other Findings   Reproductive/Obstetrics                             Anesthesia Physical Anesthesia Plan  ASA: III and emergent  Anesthesia Plan: General   Post-op Pain Management:    Induction:   PONV Risk Score and Plan:   Airway Management Planned: Oral ETT and Video Laryngoscope Planned  Additional Equipment:   Intra-op Plan:   Post-operative Plan: Post-operative intubation/ventilation  Informed Consent:   Plan Discussed with:   Anesthesia Plan Comments: (ENT at bedside and evaluated patient with fiberoptic. Normal epiglottis and vocal cords. Decision made for anesthesia to intubate with ENT at bedside as backup. )        Anesthesia Quick Evaluation

## 2020-05-25 ENCOUNTER — Inpatient Hospital Stay (HOSPITAL_COMMUNITY): Payer: Medicare Other

## 2020-05-25 DIAGNOSIS — Z789 Other specified health status: Secondary | ICD-10-CM | POA: Diagnosis not present

## 2020-05-25 DIAGNOSIS — J9601 Acute respiratory failure with hypoxia: Secondary | ICD-10-CM | POA: Diagnosis present

## 2020-05-25 DIAGNOSIS — R739 Hyperglycemia, unspecified: Secondary | ICD-10-CM | POA: Diagnosis present

## 2020-05-25 DIAGNOSIS — R061 Stridor: Secondary | ICD-10-CM

## 2020-05-25 DIAGNOSIS — Z4659 Encounter for fitting and adjustment of other gastrointestinal appliance and device: Secondary | ICD-10-CM | POA: Diagnosis not present

## 2020-05-25 DIAGNOSIS — J384 Edema of larynx: Secondary | ICD-10-CM | POA: Diagnosis present

## 2020-05-25 DIAGNOSIS — F329 Major depressive disorder, single episode, unspecified: Secondary | ICD-10-CM | POA: Diagnosis present

## 2020-05-25 DIAGNOSIS — J386 Stenosis of larynx: Secondary | ICD-10-CM | POA: Diagnosis present

## 2020-05-25 DIAGNOSIS — L930 Discoid lupus erythematosus: Secondary | ICD-10-CM | POA: Diagnosis present

## 2020-05-25 DIAGNOSIS — K589 Irritable bowel syndrome without diarrhea: Secondary | ICD-10-CM | POA: Diagnosis present

## 2020-05-25 DIAGNOSIS — J0410 Acute tracheitis without obstruction: Secondary | ICD-10-CM | POA: Diagnosis present

## 2020-05-25 DIAGNOSIS — T380X5A Adverse effect of glucocorticoids and synthetic analogues, initial encounter: Secondary | ICD-10-CM | POA: Diagnosis present

## 2020-05-25 DIAGNOSIS — Z6841 Body Mass Index (BMI) 40.0 and over, adult: Secondary | ICD-10-CM | POA: Diagnosis not present

## 2020-05-25 DIAGNOSIS — Z7951 Long term (current) use of inhaled steroids: Secondary | ICD-10-CM | POA: Diagnosis not present

## 2020-05-25 DIAGNOSIS — E785 Hyperlipidemia, unspecified: Secondary | ICD-10-CM | POA: Diagnosis present

## 2020-05-25 DIAGNOSIS — K219 Gastro-esophageal reflux disease without esophagitis: Secondary | ICD-10-CM | POA: Diagnosis present

## 2020-05-25 DIAGNOSIS — R0602 Shortness of breath: Secondary | ICD-10-CM | POA: Diagnosis present

## 2020-05-25 DIAGNOSIS — E669 Obesity, unspecified: Secondary | ICD-10-CM | POA: Diagnosis present

## 2020-05-25 DIAGNOSIS — Z79899 Other long term (current) drug therapy: Secondary | ICD-10-CM | POA: Diagnosis not present

## 2020-05-25 DIAGNOSIS — J398 Other specified diseases of upper respiratory tract: Secondary | ICD-10-CM | POA: Diagnosis present

## 2020-05-25 DIAGNOSIS — J9600 Acute respiratory failure, unspecified whether with hypoxia or hypercapnia: Secondary | ICD-10-CM | POA: Diagnosis present

## 2020-05-25 DIAGNOSIS — J45909 Unspecified asthma, uncomplicated: Secondary | ICD-10-CM | POA: Diagnosis present

## 2020-05-25 DIAGNOSIS — Z20822 Contact with and (suspected) exposure to covid-19: Secondary | ICD-10-CM | POA: Diagnosis present

## 2020-05-25 LAB — BLOOD GAS, ARTERIAL
Acid-base deficit: 0.5 mmol/L (ref 0.0–2.0)
Bicarbonate: 24.8 mmol/L (ref 20.0–28.0)
FIO2: 20
O2 Saturation: 95.5 %
Patient temperature: 98.7
pCO2 arterial: 46.1 mmHg (ref 32.0–48.0)
pH, Arterial: 7.35 (ref 7.350–7.450)
pO2, Arterial: 93.6 mmHg (ref 83.0–108.0)

## 2020-05-25 LAB — BASIC METABOLIC PANEL
Anion gap: 11 (ref 5–15)
BUN: 14 mg/dL (ref 6–20)
CO2: 22 mmol/L (ref 22–32)
Calcium: 8.5 mg/dL — ABNORMAL LOW (ref 8.9–10.3)
Chloride: 103 mmol/L (ref 98–111)
Creatinine, Ser: 0.72 mg/dL (ref 0.44–1.00)
GFR calc Af Amer: >60 mL/min (ref >60)
GFR calc non Af Amer: >60 mL/min (ref >60)
Glucose, Bld: 205 mg/dL — ABNORMAL HIGH (ref 70–99)
Potassium: 3.6 mmol/L (ref 3.5–5.1)
Sodium: 136 mmol/L (ref 135–145)

## 2020-05-25 LAB — GLUCOSE, CAPILLARY
Glucose-Capillary: 174 mg/dL — ABNORMAL HIGH (ref 70–99)
Glucose-Capillary: 174 mg/dL — ABNORMAL HIGH (ref 70–99)
Glucose-Capillary: 165 mg/dL — ABNORMAL HIGH (ref 70–99)
Glucose-Capillary: 168 mg/dL — ABNORMAL HIGH (ref 70–99)

## 2020-05-25 LAB — RESPIRATORY PANEL BY PCR

## 2020-05-25 LAB — LACTIC ACID, PLASMA: Lactic Acid, Venous: 2 mmol/L (ref 0.5–1.9)

## 2020-05-25 LAB — CBC
HCT: 37.6 % (ref 36.0–46.0)
Hemoglobin: 11.7 g/dL — ABNORMAL LOW (ref 12.0–15.0)
MCH: 28.4 pg (ref 26.0–34.0)
MCHC: 31.1 g/dL (ref 30.0–36.0)
MCV: 91.3 fL (ref 80.0–100.0)
Platelets: 326 10*3/uL (ref 150–400)
RBC: 4.12 MIL/uL (ref 3.87–5.11)
RDW: 14.6 % (ref 11.5–15.5)
WBC: 11.7 10*3/uL — ABNORMAL HIGH (ref 4.0–10.5)
nRBC: 0 % (ref 0.0–0.2)

## 2020-05-25 LAB — PROCALCITONIN: Procalcitonin: <0.1 ng/mL

## 2020-05-25 LAB — PHOSPHORUS: Phosphorus: 3 mg/dL (ref 2.5–4.6)

## 2020-05-25 LAB — MRSA PCR SCREENING: MRSA by PCR: POSITIVE — AB

## 2020-05-25 LAB — HIV ANTIBODY (ROUTINE TESTING W REFLEX): HIV Screen 4th Generation wRfx: NONREACTIVE

## 2020-05-25 LAB — MAGNESIUM: Magnesium: 1.9 mg/dL (ref 1.7–2.4)

## 2020-05-25 MED ORDER — VANCOMYCIN HCL 2000 MG/400ML IV SOLN
2000.0000 mg | INTRAVENOUS | Status: AC
  Administered 2020-05-25: 2000 mg via INTRAVENOUS
  Filled 2020-05-25: qty 400

## 2020-05-25 MED ORDER — POLYETHYLENE GLYCOL 3350 17 G PO PACK: 17.0000 g | PACK | Freq: Every day | ORAL | Status: DC | PRN

## 2020-05-25 MED ORDER — LACTATED RINGERS IV SOLN: INTRAVENOUS | Status: DC | PRN

## 2020-05-25 MED ORDER — FENTANYL CITRATE (PF) 100 MCG/2ML IJ SOLN: 100.0000 ug | Freq: Once | INTRAMUSCULAR | Status: DC

## 2020-05-25 MED ORDER — SODIUM CHLORIDE 0.9 % IV SOLN
500.0000 mg | Freq: Every day | INTRAVENOUS | Status: AC
  Administered 2020-05-25 – 2020-05-29 (×5): 500 mg via INTRAVENOUS
  Filled 2020-05-25 (×5): qty 500

## 2020-05-25 MED ORDER — SODIUM CHLORIDE 0.9 % IV SOLN
1.0000 g | INTRAVENOUS | Status: DC
  Administered 2020-05-25 – 2020-05-30 (×6): 1 g via INTRAVENOUS
  Filled 2020-05-25: qty 10
  Filled 2020-05-25: qty 1
  Filled 2020-05-25 (×6): qty 10

## 2020-05-25 MED ORDER — DOCUSATE SODIUM 100 MG PO CAPS: 100.0000 mg | ORAL_CAPSULE | Freq: Two times a day (BID) | ORAL | Status: DC | PRN

## 2020-05-25 MED ORDER — DIPHENHYDRAMINE HCL 50 MG/ML IJ SOLN: 25.0000 mg | Freq: Once | INTRAMUSCULAR | Status: AC

## 2020-05-25 MED ORDER — DICYCLOMINE HCL 20 MG PO TABS
20.0000 mg | ORAL_TABLET | Freq: Three times a day (TID) | ORAL | Status: DC
  Administered 2020-05-25 – 2020-05-27 (×11): 20 mg
  Filled 2020-05-25 (×13): qty 1

## 2020-05-25 MED ORDER — ONDANSETRON HCL 4 MG/2ML IJ SOLN
4.0000 mg | Freq: Four times a day (QID) | INTRAMUSCULAR | Status: DC | PRN
  Administered 2020-05-29: 4 mg via INTRAVENOUS
  Filled 2020-05-25: qty 2

## 2020-05-25 MED ORDER — SUCCINYLCHOLINE CHLORIDE 20 MG/ML IJ SOLN
INTRAMUSCULAR | Status: DC | PRN
  Administered 2020-05-24: 200 mg via INTRAVENOUS

## 2020-05-25 MED ORDER — FENTANYL 2500MCG IN NS 250ML (10MCG/ML) PREMIX INFUSION
0.0000 ug/h | INTRAVENOUS | Status: DC
  Administered 2020-05-25: 25 ug/h via INTRAVENOUS
  Administered 2020-05-26: 100 ug/h via INTRAVENOUS
  Administered 2020-05-26: 150 ug/h via INTRAVENOUS
  Administered 2020-05-27 – 2020-05-28 (×2): 75 ug/h via INTRAVENOUS
  Filled 2020-05-25 (×4): qty 250

## 2020-05-25 MED ORDER — ACETAMINOPHEN 325 MG PO TABS: 650.0000 mg | ORAL_TABLET | ORAL | Status: DC | PRN

## 2020-05-25 MED ORDER — CHLORHEXIDINE GLUCONATE 0.12% ORAL RINSE (MEDLINE KIT)
15.0000 mL | Freq: Two times a day (BID) | OROMUCOSAL | Status: DC
  Administered 2020-05-25 – 2020-05-28 (×6): 15 mL via OROMUCOSAL

## 2020-05-25 MED ORDER — PROPOFOL 10 MG/ML IV BOLUS: 0.5000 mg/kg | Freq: Once | INTRAVENOUS | Status: AC

## 2020-05-25 MED ORDER — MIDAZOLAM HCL 5 MG/5ML IJ SOLN
INTRAMUSCULAR | Status: DC | PRN
  Administered 2020-05-24 (×2): 1 mg via INTRAVENOUS

## 2020-05-25 MED ORDER — VANCOMYCIN HCL IN DEXTROSE 1-5 GM/200ML-% IV SOLN: 1000.0000 mg | Freq: Two times a day (BID) | INTRAVENOUS | Status: DC

## 2020-05-25 MED ORDER — CHLORHEXIDINE GLUCONATE CLOTH 2 % EX PADS
6.0000 | MEDICATED_PAD | Freq: Every day | CUTANEOUS | Status: DC
  Administered 2020-05-25 – 2020-05-29 (×7): 6 via TOPICAL

## 2020-05-25 MED ORDER — PANTOPRAZOLE SODIUM 40 MG IV SOLR
40.0000 mg | Freq: Every day | INTRAVENOUS | Status: DC
  Administered 2020-05-25 – 2020-05-27 (×4): 40 mg via INTRAVENOUS
  Filled 2020-05-25 (×4): qty 40

## 2020-05-25 MED ORDER — PROPOFOL 1000 MG/100ML IV EMUL
5.0000 ug/kg/min | INTRAVENOUS | Status: DC
  Administered 2020-05-25: 19.952 ug/kg/min via INTRAVENOUS
  Administered 2020-05-25: 40 ug/kg/min via INTRAVENOUS
  Administered 2020-05-25: 50 ug/kg/min via INTRAVENOUS
  Administered 2020-05-25 (×2): 20 ug/kg/min via INTRAVENOUS
  Administered 2020-05-26: 15 ug/kg/min via INTRAVENOUS
  Filled 2020-05-25 (×6): qty 100

## 2020-05-25 MED ORDER — DEXAMETHASONE SODIUM PHOSPHATE 4 MG/ML IJ SOLN
4.0000 mg | Freq: Four times a day (QID) | INTRAMUSCULAR | Status: DC
  Administered 2020-05-25 – 2020-05-29 (×19): 4 mg via INTRAVENOUS
  Filled 2020-05-25 (×19): qty 1

## 2020-05-25 MED ORDER — ENOXAPARIN SODIUM 40 MG/0.4ML ~~LOC~~ SOLN
40.0000 mg | SUBCUTANEOUS | Status: DC
  Administered 2020-05-25 – 2020-05-29 (×5): 40 mg via SUBCUTANEOUS
  Filled 2020-05-25 (×6): qty 0.4

## 2020-05-25 MED ORDER — LACTATED RINGERS IV SOLN: INTRAVENOUS | Status: DC

## 2020-05-25 MED ORDER — ROSUVASTATIN CALCIUM 10 MG PO TABS
10.0000 mg | ORAL_TABLET | Freq: Every day | ORAL | Status: DC
  Administered 2020-05-25 – 2020-05-27 (×3): 10 mg
  Filled 2020-05-25 (×3): qty 1

## 2020-05-25 MED ORDER — MIDAZOLAM 50MG/50ML (1MG/ML) PREMIX INFUSION
0.5000 mg/h | INTRAVENOUS | Status: DC
  Administered 2020-05-25: 0.5 mg/h via INTRAVENOUS
  Filled 2020-05-25: qty 50

## 2020-05-25 MED ORDER — MIDAZOLAM HCL 2 MG/2ML IJ SOLN: 1.0000 mg | Freq: Once | INTRAMUSCULAR | Status: DC

## 2020-05-25 MED ORDER — PROPOFOL 10 MG/ML IV BOLUS
INTRAVENOUS | Status: DC | PRN
  Administered 2020-05-24: 100 mg via INTRAVENOUS

## 2020-05-25 MED ORDER — FOLIC ACID 1 MG PO TABS
1.0000 mg | ORAL_TABLET | Freq: Every day | ORAL | Status: DC
  Administered 2020-05-25 – 2020-05-27 (×3): 1 mg
  Filled 2020-05-25 (×3): qty 1

## 2020-05-25 MED ORDER — FENTANYL CITRATE (PF) 100 MCG/2ML IJ SOLN
INTRAMUSCULAR | Status: DC | PRN
Start: 2020-05-24 — End: 2020-05-25
  Administered 2020-05-24: 100 ug via INTRAVENOUS

## 2020-05-25 MED ORDER — MUPIROCIN 2 % EX OINT
1.0000 "application " | TOPICAL_OINTMENT | Freq: Two times a day (BID) | CUTANEOUS | Status: AC
  Administered 2020-05-25 – 2020-05-29 (×10): 1 via NASAL
  Filled 2020-05-25: qty 22

## 2020-05-25 MED ORDER — ORAL CARE MOUTH RINSE
15.0000 mL | OROMUCOSAL | Status: DC
  Administered 2020-05-25 – 2020-05-28 (×32): 15 mL via OROMUCOSAL

## 2020-05-25 NOTE — ED Notes (Signed)
Patient's Versed was stopped while ED. Remainder of versd drip was placed in sharps box

## 2020-05-25 NOTE — ED Provider Notes (Signed)
.  Critical Care Performed by: Wyvonnia Dusky, MD Authorized by: Wyvonnia Dusky, MD   Critical care provider statement:    Critical care time (minutes):  65   Critical care was necessary to treat or prevent imminent or life-threatening deterioration of the following conditions:  Respiratory failure   Critical care was time spent personally by me on the following activities:  Discussions with consultants, evaluation of patient's response to treatment, examination of patient, ordering and performing treatments and interventions, ordering and review of laboratory studies, ordering and review of radiographic studies, pulse oximetry, re-evaluation of patient's condition, obtaining history from patient or surrogate and review of old charts Comments:     Frequent bedside reassessments of respiratory status, hypoxic requiring supplemental oxygen, discussion with ENT consultants, intubation and airway and ventilator management     Wyvonnia Dusky, MD 05/25/20 [phone]

## 2020-05-25 NOTE — ED Notes (Signed)
ED TO INPATIENT HANDOFF REPORT  ED Nurse Name and Phone #: [facility] [phone]  S Name/Age/Gender Kaitlyn Jennings 59 y.o. female Room/Bed: WA20/WA20  Code Status   Code Status: Full Code  Home/SNF/Other Home Patient oriented to: none Is this baseline? No   Triage Complete: Triage complete  Chief Complaint Acute respiratory failure ([place]) [J96.00]  Triage Note Patient c/o SOB x 1 1/2 weeks. Cough, SInus issues. Patient states she has had steroids, cough syrup, nebulizer, and antibiotics prescribed.    Allergies Allergies  Allergen Reactions  . Amoxicillin Other (See Comments)    Abdominal pain Abdominal pain   . Molds & Smuts   . Oseltamivir     N/V stomach cramps   . Remicade [Infliximab] Other (See Comments)    REACTION: lupus, joint pain    Level of Care/Admitting Diagnosis ED Disposition    ED Disposition Condition Comment   Admit  Hospital Area: [facility] [[id number]]  Level of Care: ICU [6]  May admit patient to [facility] or [facility] if equivalent level of care is available:: Yes  Covid Evaluation: Confirmed COVID Negative  Diagnosis: Acute respiratory failure ([place]) [518.81.ICD-9-CM]  Admitting Physician: Deland Pretty [[id number]]  Attending Physician: Deland Pretty [[id number]]  Estimated length of stay: 3 - 4 days  Certification:: I certify this patient will need inpatient services for at least 2 midnights       B Medical/Surgery History Past Medical History:  Diagnosis Date  . Anxiety   . 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 Asthma   . B12 deficiency   . Chronic gastritis   . GERD (gastroesophageal reflux disease)   . Hyperlipidemia   . IBS (irritable bowel syndrome)   . Lupus ([place])    secondary to Rmicade  . Pancreatitis   . Rectal polyp 10/01/2015  . Ulcerative colitis    Past Surgical History:  Procedure Laterality Date  . BREAST CYST ASPIRATION     left  . ESOPHAGUS SURGERY    . SINUS  SURGERY WITH INSTATRAK       A IV Location/Drains/Wounds Patient Lines/Drains/Airways Status    Active Line/Drains/Airways    Name Placement date Placement time Site Days   Peripheral IV 05/24/20 Left Antecubital 05/24/20  2045  Antecubital  1   Peripheral IV 05/24/20 Right Antecubital 05/24/20  2300  Antecubital  1   Peripheral IV 05/25/20 Right Hand 05/25/20  0005  Hand  less than 1   Airway 7 mm 05/24/20  2335   1          Intake/Output Last 24 hours  Intake/Output Summary (Last 24 hours) at 05/25/2020 0204 Last data filed at 05/25/2020 0100 Gross per 24 hour  Intake 1500.47 ml  Output --  Net 1500.47 ml    Labs/Imaging Results for orders placed or performed during the hospital encounter of 05/24/20 (from the past 48 hour(s))  SARS Coronavirus 2 by RT PCR (hospital order, performed in [facility] hospital lab) Nasopharyngeal Nasopharyngeal Swab     Status: None   Collection Time: 05/24/20  8:14 PM   Specimen: Nasopharyngeal Swab  Result Value Ref Range   SARS Coronavirus 2 NEGATIVE NEGATIVE    Comment: (NOTE) SARS-CoV-2 target nucleic acids are NOT DETECTED.  The SARS-CoV-2 RNA is generally detectable in upper and lower respiratory specimens during the acute phase of infection. The lowest concentration of SARS-CoV-2 viral copies this assay can detect is 250 copies / mL. A negative  result does not preclude SARS-CoV-2 infection and should not be used as the sole basis for treatment or other patient management decisions.  A negative result may occur with improper specimen collection / handling, submission of specimen other than nasopharyngeal swab, presence of viral mutation(s) within the areas targeted by this assay, and inadequate number of viral copies (<250 copies / mL). A negative result must be combined with clinical observations, patient history, and epidemiological information.  Fact Sheet for Patients:   StrictlyIdeas.no  Fact Sheet  for Healthcare Providers: BankingDealers.co.za  This test is not yet approved or  cleared by the [place] FDA and has been authorized for detection and/or diagnosis of SARS-CoV-2 by FDA under an Emergency Use Authorization (EUA).  This EUA will remain in effect (meaning this test can be used) for the duration of the COVID-19 declaration under Section 564(b)(1) of the Act, 21 U.S.C. section 360bbb-3(b)(1), unless the authorization is terminated or revoked sooner.  Performed at [facility], [place] [address] [place], [place] [address]   CBC with Differential     Status: Abnormal   Collection Time: 05/24/20  8:45 PM  Result Value Ref Range   WBC 12.0 (H) 4.0 - 10.5 K/uL   RBC 4.54 3.87 - 5.11 MIL/uL   Hemoglobin 12.9 12.0 - 15.0 g/dL   HCT 39.6 36 - 46 %   MCV 87.2 80.0 - 100.0 fL   MCH 28.4 26.0 - 34.0 pg   MCHC 32.6 30.0 - 36.0 g/dL   RDW 14.6 11.5 - 15.5 %   Platelets 407 (H) 150 - 400 K/uL   nRBC 0.0 0.0 - 0.2 %   Neutrophils Relative % 63 %   Neutro Abs 7.7 1.7 - 7.7 K/uL   Lymphocytes Relative 25 %   Lymphs Abs 3.0 0.7 - 4.0 K/uL   Monocytes Relative 5 %   Monocytes Absolute 0.6 0 - 1 K/uL   Eosinophils Relative 5 %   Eosinophils Absolute 0.6 (H) 0 - 0 K/uL   Basophils Relative 1 %   Basophils Absolute 0.1 0 - 0 K/uL   Immature Granulocytes 1 %   Abs Immature Granulocytes 0.09 (H) 0.00 - 0.07 K/uL    Comment: Performed at [facility], [place] [address] [place], [place] [address]  Comprehensive metabolic panel     Status: Abnormal   Collection Time: 05/24/20  8:45 PM  Result Value Ref Range   Sodium 140 135 - 145 mmol/L   Potassium 3.6 3.5 - 5.1 mmol/L   Chloride 106 98 - 111 mmol/L   CO2 23 22 - 32 mmol/L   Glucose, Bld 126 (H) 70 - 99 mg/dL    Comment: Glucose reference range applies only to samples taken after fasting for at least 8 hours.   BUN 11 6 - 20 mg/dL   Creatinine, Ser 0.67 0.44 - 1.00 mg/dL    Calcium 9.1 8.9 - 10.3 mg/dL   Total Protein 7.3 6.5 - 8.1 g/dL   Albumin 4.0 3.5 - 5.0 g/dL   AST 22 15 - 41 U/L   ALT 31 0 - 44 U/L   Alkaline Phosphatase 101 38 - 126 U/L   Total Bilirubin 0.3 0.3 - 1.2 mg/dL   GFR calc non Af Amer >60 >60 mL/min   GFR calc Af Amer >60 >60 mL/min   Anion gap 11 5 - 15    Comment: Performed at [facility], [place] [address], [place], [place] [address]  Blood gas, arterial     Status: None   Collection Time: 05/25/20  1:46 AM  Result Value Ref Range   FIO2 20.00    pH, Arterial 7.350 7.35 - 7.45   pCO2 arterial 46.1 32 - 48 mmHg   pO2, Arterial 93.6 83 - 108 mmHg   Bicarbonate 24.8 20.0 - 28.0 mmol/L   Acid-base deficit 0.5 0.0 - 2.0 mmol/L   O2 Saturation 95.5 %   Patient temperature 98.7    Allens test (pass/fail) PASS PASS    Comment: Performed at [facility], [place] [address], [place], [place] [address]   DG Chest 2 View  Result Date: 05/24/2020 CLINICAL DATA:  Dyspnea EXAM: CHEST - 2 VIEW COMPARISON:  04/05/2018 FINDINGS: The heart size and mediastinal contours are within normal limits. Both lungs are clear. The visualized skeletal structures are unremarkable. IMPRESSION: No active cardiopulmonary disease. Electronically Signed   By: Fidela Salisbury MD   On: 05/24/2020 17:59   CT Soft Tissue Neck W Contrast  Result Date: 05/24/2020 CLINICAL DATA:  Stridor EXAM: CT NECK WITH CONTRAST TECHNIQUE: Multidetector CT imaging of the neck was performed using the standard protocol following the bolus administration of intravenous contrast. CONTRAST:  75m OMNIPAQUE IOHEXOL 300 MG/ML  SOLN COMPARISON:  None. FINDINGS: PHARYNX AND LARYNX: There is marked narrowing of the glottic and infraglottic laryngeal airway with minimum diameter of 4 mm. The pharynx is normal. Normal epiglottis. No retropharyngeal abnormality. SALIVARY GLANDS: Normal parotid, submandibular and sublingual glands. THYROID: Normal. LYMPH NODES: No  enlarged or abnormal density lymph nodes. VASCULAR: Major cervical vessels are patent. LIMITED INTRACRANIAL: Normal. VISUALIZED ORBITS: Normal. MASTOIDS AND VISUALIZED PARANASAL SINUSES: No fluid levels or advanced mucosal thickening. No mastoid effusion. SKELETON: No bony spinal canal stenosis. No lytic or blastic lesions. UPPER CHEST: There is debris in the upper trachea. Lung apices are clear. OTHER: None. IMPRESSION: 1. Marked narrowing of the glottic and infraglottic laryngeal airway with minimum diameter of 4 mm. 2. Debris in the upper trachea. Electronically Signed   By: KUlyses JarredM.D.   On: 05/24/2020 21:58   DG Chest Portable 1 View  Result Date: 05/25/2020 CLINICAL DATA:  Intubated, stridor EXAM: PORTABLE CHEST 1 VIEW COMPARISON:  05/24/2020 FINDINGS: Single frontal view of the chest demonstrates endotracheal tube overlying tracheal air column, tip at level of thoracic inlet. The carina is poorly visualized. Cardiac silhouette is unremarkable. Lung volumes are diminished with crowding of the central vasculature. No airspace disease, effusion, or pneumothorax. No acute bony abnormalities. IMPRESSION: 1. Endotracheal tube at level of thoracic inlet. 2. Low lung volumes. Electronically Signed   By: MRanda NgoM.D.   On: 05/25/2020 00:07    Pending Labs Unresulted Labs (From admission, onward)          Start     Ordered   05/25/20 00174 Basic metabolic panel  Tomorrow morning,   R        05/25/20 0112   05/25/20 0500  Blood gas, arterial  Tomorrow morning,   R        05/25/20 0112   05/25/20 0500  CBC  Tomorrow morning,   R        05/25/20 0112   05/25/20 0500  Magnesium  Tomorrow morning,   R        05/25/20 0112   05/25/20 0500  Phosphorus  Tomorrow morning,   R        05/25/20 0112   05/25/20 0113  CBC  (enoxaparin (LOVENOX)    CrCl >/= 30 ml/min)  Once,   STAT       Comments: Baseline for enoxaparin therapy IF NOT ALREADY DRAWN.  Notify MD if PLT < 100 K.    05/25/20 0112    05/25/20 0113  Lactic acid, plasma  STAT Now then every 3 hours,   R (with STAT occurrences)      05/25/20 0112   05/25/20 0113  Procalcitonin  Once,   STAT        05/25/20 0112   05/25/20 0107  HIV Antibody (routine testing w rflx)  (HIV Antibody (Routine testing w reflex) panel)  Once,   STAT        05/25/20 0112   05/24/20 2207  Respiratory Panel by PCR  (Respiratory virus panel with precautions)  Once,   STAT        05/24/20 2206          Vitals/Pain Today's Vitals   05/25/20 0055 05/25/20 0138 05/25/20 0144 05/25/20 0203  BP: 102/65 95/62 98/60  (!) 103/55  Pulse: 83   81  Resp: 16   16  Temp:    98.3 F (36.8 C)  TempSrc:    Oral  SpO2: 94%   96%  Weight:      Height:      PainSc:        Isolation Precautions No active isolations  Medications Medications  aerochamber Z-Stat Plus/medium 1 each (0 each Other Hold 05/24/20 2053)  fentaNYL 2576mg in NS 2576m(1029mml) infusion-PREMIX (50 mcg/hr Intravenous Rate/Dose Change 05/25/20 0032)  fentaNYL (SUBLIMAZE) injection 100 mcg ( Intravenous Not Given 05/25/20 0122)  midazolam (VERSED) injection 1 mg ( Intravenous Not Given 05/25/20 0121)  docusate sodium (COLACE) capsule 100 mg (has no administration in time range)  polyethylene glycol (MIRALAX / GLYCOLAX) packet 17 g (has no administration in time range)  pantoprazole (PROTONIX) injection 40 mg (40 mg Intravenous Given 05/25/20 0136)  acetaminophen (TYLENOL) tablet 650 mg (has no administration in time range)  lactated ringers infusion ( Intravenous New Bag/Given 05/25/20 0120)  ondansetron (ZOFRAN) injection 4 mg (has no administration in time range)  enoxaparin (LOVENOX) injection 40 mg (has no administration in time range)  dexamethasone (DECADRON) injection 4 mg (4 mg Intravenous Given 05/25/20 0145)  propofol (DIPRIVAN) 1000 MG/100ML infusion (50 mcg/kg/min  111.1 kg Intravenous New Bag/Given 05/25/20 0148)  cefTRIAXone (ROCEPHIN) 1 g in sodium chloride 0.9 % 100 mL  IVPB (has no administration in time range)  vancomycin (VANCOREADY) IVPB 2000 mg/400 mL (has no administration in time range)  vancomycin (VANCOCIN) IVPB 1000 mg/200 mL premix (has no administration in time range)  albuterol (VENTOLIN HFA) 108 (90 Base) MCG/ACT inhaler 6 puff (6 puffs Inhalation Given 05/24/20 2045)  dexamethasone (DECADRON) injection 10 mg (10 mg Intravenous Given 05/24/20 2045)  iohexol (OMNIPAQUE) 300 MG/ML solution 75 mL (75 mLs Intravenous Contrast Given 05/24/20 2134)  lactated ringers bolus 250 mL (0 mLs Intravenous Stopped 05/25/20 0040)  Racepinephrine HCl 2.25 % nebulizer solution 0.5 mL (0.5 mLs Nebulization Given 05/24/20 2220)  propofol (DIPRIVAN) 10 mg/mL bolus/IV push (  Override pull for Anesthesia 05/24/20 2342)  propofol (DIPRIVAN) 10 mg/mL bolus/IV push 55.6 mg (55.6 mg Intravenous Given 05/24/20 2347)  diphenhydrAMINE (BENADRYL) injection 25 mg (25 mg Intravenous Given 05/24/20 2346)    Mobility non-ambulatory Moderate fall risk   Focused Assessments Cardiac Assessment Handoff:    No results found for: CKTOTAL, CKMB, CKMBINDEX, TROPONINI No results found  for: DDIMER Does the Patient currently have chest pain? No      R Recommendations: See Admitting Provider Note  Report given to:   Additional Notes:

## 2020-05-25 NOTE — Anesthesia Procedure Notes (Signed)
Procedure Name: Intubation Date/Time: 05/24/2020 11:35 PM Performed by: Lissa Morales, CRNA Pre-anesthesia Checklist: Patient identified, Emergency Drugs available, Suction available and Patient being monitored Patient Re-evaluated:Patient Re-evaluated prior to induction Oxygen Delivery Method: Circle system utilized Preoxygenation: Pre-oxygenation with 100% oxygen Induction Type: IV induction Ventilation: Mask ventilation without difficulty Laryngoscope Size: Glidescope and 3 Grade View: Grade II Tube type: Oral Tube size: 7.0 mm Number of attempts: 1 Airway Equipment and Method: Stylet and Oral airway Placement Confirmation: ETT inserted through vocal cords under direct vision,  positive ETCO2,  breath sounds checked- equal and bilateral and CO2 detector Secured at: 22 (at lips ) cm Tube secured with: Tape Dental Injury: Teeth and Oropharynx as per pre-operative assessment  Difficulty Due To: Difficulty was anticipated and Difficult Airway-  due to edematous airway Comments: Anesthesia was called to intubate patient in ER with ENT Dr. Benjamine Mola at bedside after he evaluated pt with nasal fiberoptic  Scope. No edema or swelling around the cords. Dr. Elgie Congo here and  Pt preoxygenated with 100% O2  With O2 sats  Up to 100% prior to intubation.(  Preop O2 sats  90%)  And stridorous..  Glidescope  Used with MAC 3 handle . Good visualization with scope and ETT 7.0 passed with slight increased pressure after going through cords. Bilateral breath sounds  Equal at 22 at lips  O2 sats 98%.+ ETCO2. . Chest Xray obtained.

## 2020-05-25 NOTE — Progress Notes (Signed)
Patient's two wedding rings locked with security. Key and paperwork on patient's chart. All other  Belongings sent home with husband from ED.

## 2020-05-25 NOTE — Progress Notes (Signed)
NAME:  Kaitlyn Jennings, MRN:  [id number], DOB:  10/22/1960, LOS: 0 ADMISSION DATE:  05/24/2020, CONSULTATION DATE:  05/24/2020 CHIEF COMPLAINT:  SOB & swelling in throat  Brief History   59 y/o F presenting to [facility] ED with 2 days of swelling in her throat and increasing shortness of breath.   Past Medical History  Ulcerative Colitis Lupus- s/t Remicade IBS HLD GERD Asthma Arthritis- s/t Remicade Anxiety Pancreatitis  Significant Hospital Events   9/16 >> Difficult airway intubation for airway protection d/t subglottic edema 9/17 >> Admit to ICU  Consults:  ENT  Procedures:  ETT 9/16 >>   Significant Diagnostic Tests:  CT neck w/ contrast 9/16 >> narrowing of the glottic and subglottic laryngeal airway w/ minimum diameter of 4 mm CXR 9/16 >> confirming ETT & OGT placement, low lung volumes  Micro Data:  COVID 9/16 >> negative Resp Panel 9/16 >> MRSA 9/17 >> positive  Antimicrobials:  Ceftriaxone 9/17 >>  Vancomycin 9/17 >>  Interim history/subjective:  Intubated for airway protection, receiving steroids Pt appears comfortable on fentanyl & propofol drips  Objective   Blood pressure 125/66, pulse 75, temperature 98.1 F (36.7 C), temperature source Axillary, resp. rate 20, height 5' 9"  (1.753 m), weight 123.6 kg, last menstrual period 11/12/2016, SpO2 96 %.    Vent Mode: PRVC FiO2 (%):  [50 %-70 %] 50 % Set Rate:  [16 bmp] 16 bmp Vt Set:  [520 mL] 520 mL PEEP:  [5 cmH20] 5 cmH20 Plateau Pressure:  [17 cmH20-18 cmH20] 18 cmH20   Intake/Output Summary (Last 24 hours) at 05/25/2020 0841 Last data filed at 05/25/2020 0800 Gross per 24 hour  Intake 2689.55 ml  Output 325 ml  Net 2364.55 ml   Filed Weights   05/24/20 1639 05/25/20 0320  Weight: 111.1 kg 123.6 kg    Examination:  General: Adult female, sedated on ventilator, awakens to voice with pleasant disposition HENT: MM pink/moist, anicteric Lungs: ETT, unlabored, breath sounds clear/diminished, minimal  cuff leak: 20-40 Cardiovascular: s1s2, RRR, NSR on monitor, no murmurs/gallops/rubs, +3 pulses  Abdomen: soft, non tender, active bowel sounds- OGT to ILWS with red tinged output Extremities: warm/dry, no edema Neuro: Responds to voice, follows commands, denies pain, smiling at husband GU: foley in place  Resolved Hospital Problem list     Assessment & Plan:   Acute Respiratory Failure due to subglottic edema 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 Panel - PRVC: 8 mL/kg - SBT/WUA Daily - VAP protocol in place - PAD protocol in place - VTE/SUP in place - Strict I&O's  Ulcerative Colitis - Unable to administer per OGT - Resume when able to take PO's   Irritable Bowel Syndrome - will restart dicyclomine  Hyperlipidemia - will restart home Crestor  Steroid Induced Hyperglycemia - Q 4 CBG initiated, will start SSI if needed  Best practice:  Diet: NPO Pain/Anxiety/Delirium protocol (if indicated): Fentanyl & Propofol VAP protocol (if indicated): Initiated DVT prophylaxis: Lovenox & SCD's GI prophylaxis: Protonix Glucose control: Q 4, range 140-180 Mobility: Bedrest Code Status: FULL Family Communication: Updated husband at bedside Disposition: ICU  Labs   CBC: Recent Labs  Lab 05/24/20 2045 05/25/20 0335  WBC 12.0* 11.7*  NEUTROABS 7.7  --   HGB 12.9 11.7*  HCT 39.6 37.6  MCV 87.2 91.3  PLT 407* 798    Basic Metabolic Panel: Recent Labs  Lab 05/24/20 2045 05/25/20 0335  NA 140 136  K 3.6 3.6  CL 106 103  CO2 23 22  GLUCOSE 126* 205*  BUN 11 14  CREATININE 0.67 0.72  CALCIUM 9.1 8.5*  MG  --  1.9  PHOS  --  3.0   GFR: Estimated Creatinine Clearance: 106.6 mL/min (by C-G formula based on SCr of 0.72 mg/dL). Recent Labs  Lab  05/24/20 2045 05/25/20 0335  PROCALCITON  --  <0.10  WBC 12.0* 11.7*  LATICACIDVEN  --  2.0*    Liver Function Tests: Recent Labs  Lab 05/24/20 2045  AST 22  ALT 31  ALKPHOS 101  BILITOT 0.3  PROT 7.3  ALBUMIN 4.0   No results for input(s): LIPASE, AMYLASE in the last 168 hours. No results for input(s): AMMONIA in the last 168 hours.  ABG    Component Value Date/Time   PHART 7.350 05/25/2020 0146   PCO2ART 46.1 05/25/2020 0146   PO2ART 93.6 05/25/2020 0146   HCO3 24.8 05/25/2020 0146   ACIDBASEDEF 0.5 05/25/2020 0146   O2SAT 95.5 05/25/2020 0146     Coagulation Profile: No results for input(s): INR, PROTIME in the last 168 hours.  Cardiac Enzymes: No results for input(s): CKTOTAL, CKMB, CKMBINDEX, TROPONINI in the last 168 hours.  HbA1C: Hgb A1c MFr Bld  Date/Time Value Ref Range Status  08/20/2012 08:24 AM 6.2 4.6 - 6.5 % Final    Comment:    Glycemic Control Guidelines for People with Diabetes:Non Diabetic:  <6%Goal of Therapy: <7%Additional Action Suggested:  >8%     CBG: No results for input(s): GLUCAP in the last 168 hours.   Past Medical History  She,  has a past medical history of Anxiety, Arthritis, Asthma, B12 deficiency, Chronic gastritis, GERD (gastroesophageal reflux disease), Hyperlipidemia, IBS (irritable bowel syndrome), Lupus ([place]), Pancreatitis, Rectal polyp (10/01/2015), and Ulcerative colitis.   Surgical History    Past Surgical History:  Procedure Laterality Date  . BREAST CYST ASPIRATION     left  . ESOPHAGUS SURGERY    . SINUS SURGERY WITH INSTATRAK       Social History   reports that she has never smoked. She has never used smokeless tobacco. She reports that she does not drink alcohol and does not use drugs.   Family History   Her family history includes COPD in her father; Diabetes in her maternal grandfather; Irritable bowel syndrome in her father. There is no history of Colon cancer or Stomach cancer.    Allergies Allergies  Allergen Reactions  . Amoxicillin Other (See Comments)    Abdominal pain Abdominal pain   . Molds & Smuts   . Oseltamivir     N/V stomach cramps   . Remicade [Infliximab] Other (See Comments)    REACTION: lupus, joint pain     Home Medications  Prior to Admission medications   Medication Sig Start Date End Date Taking? Authorizing Provider  albuterol (PROVENTIL) (2.5 MG/3ML) 0.083% nebulizer solution Take 2.5 mg by nebulization as needed.     Yes [provider]  azithromycin (ZITHROMAX) 250 MG tablet Take 250 mg by mouth as directed. 2 TABLETS ON DAY 1, THEN 1 TABLET ON DAYS 2-5 05/21/20  Yes [provider]  buPROPion (WELLBUTRIN XL) 150 MG 24 hr tablet Take 1 tablet by mouth daily. 12/14/17  Yes [provider]  Calcium Carbonate-Vitamin D (CALCIUM 500 + D  PO) Take 1 capsule by mouth daily.     Yes [provider]  cyanocobalamin (,VITAMIN B-12,) 1000 MCG/ML injection inject 1 ml every month Dispense with syringes and needles 01/16/20  Yes Nandigam, Kavitha V, MD  dicyclomine (BENTYL) 20 MG tablet TAKE 1 TABLET BY MOUTH 4 TIMES DAILY WITH MEALS AND  AT BEDTIME 01/13/18  Yes Nandigam, Kavitha V, MD  esomeprazole (NEXIUM) 40 MG capsule TAKE 1 CAPSULE BY MOUTH TWICE DAILY BEFORE A MEAL Patient taking differently: Take 40 mg by mouth daily. TAKE 1 CAPSULE BY MOUTH TWICE DAILY BEFORE A MEAL 09/22/19  Yes Nandigam, Venia Minks, MD  ferrous sulfate 325 (65 FE) MG tablet Take 325 mg by mouth daily with breakfast.     Yes [provider]  fluticasone (FLONASE) 50 MCG/ACT nasal spray Place 2 sprays into both nostrils daily.   Yes [provider]  folic acid (FOLVITE) 1 MG tablet Take 1 tablet by mouth once daily 02/17/20  Yes Nandigam, Kavitha V, MD  Lactobacillus Rhamnosus, GG, (CULTURELLE PO) Take 1 capsule by mouth daily.   Yes [provider]  mercaptopurine (PURINETHOL) 50 MG tablet TAKE 1 TABLET BY MOUTH DAILY  ON AN EMPTY STOMACH ONE  HOUR  BEFORE  OR  2  HOURS  AFTER  MEALS 09/22/19  Yes Nandigam, Venia Minks, MD  mesalamine (LIALDA) 1.2 g EC tablet Take 4 tablets (4.8 g total) by mouth daily with breakfast. 01/20/20  Yes Nandigam, Venia Minks, MD  mometasone-formoterol (DULERA) 100-5 MCG/ACT AERO Take 2 puffs first thing in am and then another 2 puffs about 12 hours later. 04/05/18  Yes Tanda Rockers, MD  mupirocin ointment (BACTROBAN) 2 % Apply topically as directed. 12/14/17  Yes [provider]  Needles & Syringes MISC Use to inject B12 weekly x 4 then every month 09/26/19  Yes Nandigam, Venia Minks, MD  ondansetron (ZOFRAN) 4 MG tablet Take 4 mg by mouth every 8 (eight) hours as needed for nausea.    Yes [provider]  polyethylene glycol powder (GLYCOLAX/MIRALAX) powder Reported on 10/01/2015 02/18/11  Yes Lafayette Dragon, MD  promethazine-dextromethorphan (PROMETHAZINE-DM) 6.25-15 MG/5ML syrup Take 5 mLs by mouth 4 (four) times daily as needed. 05/16/20  Yes [provider]  rosuvastatin (CRESTOR) 10 MG tablet Take 10 mg by mouth daily.     Yes [provider]  sucralfate (CARAFATE) 1 g tablet Take 1 tablet by mouth twice daily 10/03/19  Yes Nandigam, Venia Minks, MD  traMADol (ULTRAM) 50 MG tablet Take 1 tablet (50 mg total) by mouth 2 (two) times daily as needed. 09/22/19  Yes Nandigam, Venia Minks, MD  venlafaxine XR (EFFEXOR-XR) 150 MG 24 hr capsule Take 150 mg by mouth daily with breakfast.   Yes [provider]     Critical care time: N/A      Domingo Pulse [place], AGACNP-BC  Pulmonary & Critical Care    Please see Amion for pager details.

## 2020-05-25 NOTE — Transfer of Care (Signed)
Immediate Anesthesia Transfer of Care Note  Patient: Kaitlyn Jennings  Procedure(s) Performed: AN AD HOC INTUBATION  Patient LocationER  Anesthesia Type:GA  Level of Consciousness: sedated and Patient remains intubated per anesthesia plan  Airway & Oxygen Therapy: Patient placed on Ventilator (see vital sign flow sheet for setting)  Post-op Assessment: Post -op Vital signs reviewed and stable  Post vital signs: stable  Last Vitals:  Vitals Value Taken Time  BP    Temp    Pulse    Resp    SpO2      Last Pain:  Vitals:   05/24/20 2215  TempSrc: Oral  PainSc:          Complications: No complications documented.

## 2020-05-25 NOTE — Progress Notes (Signed)
CRITICAL VALUE ALERT  Critical Value:  Lactic Acid 2.0, MRSA by PCR positive  Date & Time Notied:  05/25/2020 0435  Provider Notified: Su Grand   Orders Received/Actions taken: Bactroban standing orders.

## 2020-05-25 NOTE — Progress Notes (Signed)
Initial Nutrition Assessment  DOCUMENTATION CODES:   Obesity unspecified  INTERVENTION:  - will monitor for plans concerning nutrition.  NUTRITION DIAGNOSIS:   Inadequate oral intake related to inability to eat as evidenced by NPO status.  GOAL:   Provide needs based on ASPEN/SCCM guidelines  MONITOR:   Vent status, Labs, Weight trends  REASON FOR ASSESSMENT:   Ventilator  ASSESSMENT:   59 year old female with medical history of IBS/ulcerative colitis, anxiety, pancreatitis, vitamin B12 deficiency, chronic gastritis, GERD, asthma, arthritis, lupus, and hyperlipidemia. She presented to the ED with 1-week hx of upper respiratory-type symptoms of rhinorrhea, headaches, sore throat, and generalized malaise. In the 48 hours PTA she had increased difficulty swallowing. CT of neck done and patient was intubated in the ED by ENT under anesthesia.  Patient intubated with OGT to LIS with ~75 ml output of dark brown/maroon. No family/visitors present at this time.   She has not been seen by a RD at [facility] at any time in the past. Weight today is 272 lb. Weight yesterday documented as 245 lb which appears to be a stated weight. PTA, the most recently documented weight was on 09/22/19 when she weighed 262 lb.   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  Medications reviewed; 1 mg folvite/day.  IVF; LR @ 75 ml/hr.     NUTRITION - FOCUSED PHYSICAL EXAM:  completed; no muscle or fat depletions, moderate edema to BLE.   Diet Order:   Diet Order            Diet NPO time specified  Diet effective now                 EDUCATION NEEDS:    No education needs have been identified at this time  Skin:  Skin Assessment: Reviewed RN Assessment  Last BM:  PTA/unknown  Height:   Ht Readings from Last 1 Encounters:  05/24/20 5' 9"  (1.753 m)    Weight:   Wt Readings from Last 1 Encounters:  05/25/20 123.6 kg     Estimated Nutritional Needs:  Kcal:  1360-1730 kcal Protein:  >/= 132 grams Fluid:  >/= 1.8 L/day      Jarome Matin, MS, RD, LDN, CNSC Inpatient Clinical Dietitian RD pager # available in AMION  After hours/weekend pager # available in [facility]

## 2020-05-25 NOTE — Progress Notes (Signed)
OGT is to LIS per orders. Drainage is blood-tinged; CCM notified by this RN. Per CCM, GI will be consulted d/t ulcerative colitis Hx. This RN will continue to carefully monitor output per CCM orders.

## 2020-05-25 NOTE — Progress Notes (Signed)
Pt transported from [place] to room 1234.  Pt remained stable and comfortable on mechanical ventilation during transport.  Pt suctioned prior to transport.

## 2020-05-25 NOTE — TOC Initial Note (Signed)
Transition of Care [facility]) - Initial/Assessment Note    Patient Details  Name: Kaitlyn Jennings MRN: [id number] Date of Birth: 07-29-1961  Transition of Care [facility]) CM/SW Contact:    Leeroy Cha, RN Phone Number: 05/25/2020, 9:20 AM  Clinical Narrative:                 Patient with subglottis obstruction and resp. Failure, ventilated by ENT due to swelling, pmh-lupus, iv abx,iv sedation, iv decadron, iv lr at 75cc/hr Plan is to return home once airway is stable and patent.  Expected Discharge Plan: Home/Self Care Barriers to Discharge: Continued Medical Work up   Patient Goals and CMS Choice Patient states their goals for this hospitalization and ongoing recovery are:: unable to state on vent and sedation      Expected Discharge Plan and Services Expected Discharge Plan: Home/Self Care   Discharge Planning Services: CM Consult   Living arrangements for the past 2 months: Single Family Home                                      Prior Living Arrangements/Services Living arrangements for the past 2 months: Single Family Home Lives with:: Spouse Patient language and need for interpreter reviewed:: Yes        Need for Family Participation in Patient Care: Yes (Comment) Care giver support system in place?: Yes (comment)   Criminal Activity/Legal Involvement Pertinent to Current Situation/Hospitalization: No - Comment as needed  Activities of Daily Living Home Assistive Devices/Equipment: Eyeglasses ADL Screening (condition at time of admission) Patient's cognitive ability adequate to safely complete daily activities?: Yes Is the patient deaf or have difficulty hearing?: No Does the patient have difficulty seeing, even when wearing glasses/contacts?: No Does the patient have difficulty concentrating, remembering, or making decisions?: No Patient able to express need for assistance with ADLs?: Yes Does the patient have difficulty dressing or bathing?:  No Independently performs ADLs?: Yes (appropriate for developmental age) Does the patient have difficulty walking or climbing stairs?: Yes Weakness of Legs: Both Weakness of Arms/Hands: Both  Permission Sought/Granted                  Emotional Assessment Appearance:: Appears stated age Attitude/Demeanor/Rapport: Unable to Assess Affect (typically observed): Unable to Assess Orientation: : Fluctuating Orientation (Suspected and/or reported Sundowners) (on vent and sedated) Alcohol / Substance Use: Not Applicable Psych Involvement: No (comment)  Admission diagnosis:  Acute respiratory failure ([place]) [J96.00] Stridor [R06.1] Anticipated difficulty with intubation [Z78.9] Patient Active Problem List   Diagnosis Date Noted  . Acute respiratory failure ([place]) 05/25/2020  . Upper airway cough syndrome 04/05/2018  . B12 DEFICIENCY 03/26/2010  . GASTRITIS 02/04/2008  . ABDOMINAL PAIN, EPIGASTRIC 02/04/2008  . ANXIETY 09/25/2007  . Ulcerative colitis ([place]) 09/25/2007  . IRRITABLE BOWEL SYNDROME 09/25/2007  . ARTHRITIS 09/25/2007  . PANCREATITIS, ACUTE, HX OF 09/25/2007   PCP:  Manfred Shirts, PA Pharmacy:   [place] ([place]), [place] [address] [place] ([place]) [place] [address] Phone: [phone] Fax: [phone]  [facility] - [place], [place] [address] [place] [address] Phone: [phone] Fax: [phone]     Social Determinants of Health (SDOH) Interventions    Readmission Risk Interventions No flowsheet data found.

## 2020-05-25 NOTE — Progress Notes (Signed)
eLink Physician-Brief Progress Note Patient Name: Kaitlyn Jennings DOB: Feb 07, 1961 MRN: [id number]   Date of Service  05/25/2020  HPI/Events of Note  Patient intubated in the ED at [facility]  Secondary to concerns of evolving laryngeal edema with potential for causing life threatening upper airway obstruction. Per ENT the swelling appears to be mucosal , possibly from a viral syndrome.  eICU Interventions  New Patient Evaluation completed.        Kerry Kass Remedios Mckone 05/25/2020, 3:15 AM

## 2020-05-25 NOTE — Progress Notes (Signed)
Cuff leak assessment completed at time of vent check. Patient initially had mild leak present. No plan to extubate today.

## 2020-05-25 NOTE — H&P (Signed)
NAME:  Kaitlyn Jennings, MRN:  [id number], DOB:  01/16/1961, LOS: 0 ADMISSION DATE:  05/24/2020,  CHIEF COMPLAINT: Neck fullness and difficulty breathing  Brief History   59 year old with acute respiratory failure secondary to subglottic edema.  History of present illness   59 year old white female that presented to the emergency room from home. She complains of approximately 1 week of upper respiratory type symptoms-rhinorrhea, headaches, sore throat, generalized malaise. Over the past 48 hours patient has had increasing feeling of throat fullness and difficulty swallowing. She had been seen by her GP and put on prednisone and antibiotics, 4 days ago. Her and her husband tested negative proxy 1 week ago for Covid. He continues to have the same symptoms she has had with that section of the difficulty swallowing or throat fullness. Patient presented to the emergency room with stridor but was able to verbalize without difficulty. CT of the neck demonstrated a 4 mm airway just distal to the vocal cords. Patient was intubated by anesthesia and ENT.    Consults:  ENT Anesthesia  Procedures:  Laryngoscopy Oral intubation  Significant Diagnostic Tests:  CAT scan soft tissue neck: IMPRESSION: 1. Marked narrowing of the glottic and infraglottic laryngeal airway with minimum diameter of 4 mm. 2. Debris in the upper trachea.   Antimicrobials:  Vancomycin Ceftriaxone   Objective   Blood pressure 115/64, pulse 80, temperature 98.3 F (36.8 C), temperature source Oral, resp. rate 16, height 5' 9"  (1.753 m), weight 111.1 kg, last menstrual period 11/12/2016, SpO2 97 %.    Vent Mode: PRVC FiO2 (%):  [60 %-70 %] 70 % Set Rate:  [16 bmp] 16 bmp Vt Set:  [520 mL] 520 mL PEEP:  [5 cmH20] 5 cmH20 Plateau Pressure:  [17 cmH20] 17 cmH20   Intake/Output Summary (Last 24 hours) at 05/25/2020 0252 Last data filed at 05/25/2020 0209 Gross per 24 hour  Intake 1571.24 ml  Output --  Net 1571.24 ml    Filed Weights   05/24/20 1639  Weight: 111.1 kg    Examination: Examination occurred post intubation. General: No acute distress. HENT: Atraumatic/normocephalic orally intubated with #7 endotracheal tube. No lymphadenopathy no significant fullness of the exterior aspect of the neck. Lungs: CTA B no wheezing rales or rhonchi noted. Cardiovascular: Regular rate no rub murmur gallop appreciated Abdomen: Soft, nondistended, no rebound/rigidity/guarding though significantly limited by neurologic status. Positive bowel sounds. Extremities: Distal pulse intact x4. No significant edema. There is no cyanosis. Neuro: Unconscious/unresponsive chemically sedated. RASS -4 GU: Foley catheter in place    Assessment & Plan:  Acute respiratory failure Subglottic edema/tracheitis Upper respiratory infection Asthma Lupus erythematosus   Plan: Patient was emergently intubated with anesthesia and ENT. Laryngoscopy was performed at that time. Started on Decadron 4 mg IV every 6 hours. Vancomycin and Rocephin were started. Standard ventilator protocol was initiated. Monitor I's/O's.    Best practice:  Diet: N.p.o. Pain/Anxiety/Delirium protocol (if indicated): Propofol/fentanyl VAP protocol (if indicated): Initiated DVT prophylaxis: Lovenox GI prophylaxis: Protonix Glucose control: Monitor glucose levels. Mobility: Bed rest Code Status: Full Family Communication: Updated patient's husband at bedside. Disposition: Admit to the intensive care unit  Labs   CBC: Recent Labs  Lab 05/24/20 2045  WBC 12.0*  NEUTROABS 7.7  HGB 12.9  HCT 39.6  MCV 87.2  PLT 407*    Basic Metabolic Panel: Recent Labs  Lab 05/24/20 2045  NA 140  K 3.6  CL 106  CO2 23  GLUCOSE 126*  BUN 11  CREATININE  0.67  CALCIUM 9.1   GFR: Estimated Creatinine Clearance: 100.6 mL/min (by C-G formula based on SCr of 0.67 mg/dL). Recent Labs  Lab 05/24/20 2045  WBC 12.0*    Liver Function  Tests: Recent Labs  Lab 05/24/20 2045  AST 22  ALT 31  ALKPHOS 101  BILITOT 0.3  PROT 7.3  ALBUMIN 4.0   No results for input(s): LIPASE, AMYLASE in the last 168 hours. No results for input(s): AMMONIA in the last 168 hours.  ABG    Component Value Date/Time   PHART 7.350 05/25/2020 0146   PCO2ART 46.1 05/25/2020 0146   PO2ART 93.6 05/25/2020 0146   HCO3 24.8 05/25/2020 0146   ACIDBASEDEF 0.5 05/25/2020 0146   O2SAT 95.5 05/25/2020 0146     Coagulation Profile: No results for input(s): INR, PROTIME in the last 168 hours.  Cardiac Enzymes: No results for input(s): CKTOTAL, CKMB, CKMBINDEX, TROPONINI in the last 168 hours.  HbA1C: Hgb A1c MFr Bld  Date/Time Value Ref Range Status  08/20/2012 08:24 AM 6.2 4.6 - 6.5 % Final    Comment:    Glycemic Control Guidelines for People with Diabetes:Non Diabetic:  <6%Goal of Therapy: <7%Additional Action Suggested:  >8%     CBG: No results for input(s): GLUCAP in the last 168 hours.  Review of Systems:   Unable to perform secondary to patient's neurologic status.  Past Medical History  She,  has a past medical history of Anxiety, Arthritis, Asthma, B12 deficiency, Chronic gastritis, GERD (gastroesophageal reflux disease), Hyperlipidemia, IBS (irritable bowel syndrome), Lupus ([place]), Pancreatitis, Rectal polyp (10/01/2015), and Ulcerative colitis.   Surgical History    Past Surgical History:  Procedure Laterality Date   BREAST CYST ASPIRATION     left   ESOPHAGUS SURGERY     SINUS SURGERY WITH INSTATRAK       Social History   reports that she has never smoked. She has never used smokeless tobacco. She reports that she does not drink alcohol and does not use drugs. Per patient's husband  Family History   Her family history includes COPD in her father; Diabetes in her maternal grandfather; Irritable bowel syndrome in her father. There is no history of Colon cancer or Stomach cancer.   Allergies Allergies  Allergen  Reactions   Amoxicillin Other (See Comments)    Abdominal pain Abdominal pain    Molds & Smuts    Oseltamivir     N/V stomach cramps    Remicade [Infliximab] Other (See Comments)    REACTION: lupus, joint pain     Home Medications  Prior to Admission medications   Medication Sig Start Date End Date Taking? Authorizing Provider  albuterol (PROVENTIL) (2.5 MG/3ML) 0.083% nebulizer solution Take 2.5 mg by nebulization as needed.     Yes [provider]  azithromycin (ZITHROMAX) 250 MG tablet Take 250 mg by mouth as directed. 2 TABLETS ON DAY 1, THEN 1 TABLET ON DAYS 2-5 05/21/20  Yes [provider]  buPROPion (WELLBUTRIN XL) 150 MG 24 hr tablet Take 1 tablet by mouth daily. 12/14/17  Yes [provider]  Calcium Carbonate-Vitamin D (CALCIUM 500 + D PO) Take 1 capsule by mouth daily.     Yes [provider]  cyanocobalamin (,VITAMIN B-12,) 1000 MCG/ML injection inject 1 ml every month Dispense with syringes and needles 01/16/20  Yes Nandigam, Kavitha V, MD  dicyclomine (BENTYL) 20 MG tablet TAKE 1 TABLET BY MOUTH 4 TIMES DAILY WITH MEALS AND  AT BEDTIME 01/13/18  Yes Nandigam, Venia Minks, MD  esomeprazole (NEXIUM) 40 MG capsule TAKE 1 CAPSULE BY MOUTH TWICE DAILY BEFORE A MEAL Patient taking differently: Take 40 mg by mouth daily. TAKE 1 CAPSULE BY MOUTH TWICE DAILY BEFORE A MEAL 09/22/19  Yes Nandigam, Venia Minks, MD  ferrous sulfate 325 (65 FE) MG tablet Take 325 mg by mouth daily with breakfast.     Yes [provider]  fluticasone (FLONASE) 50 MCG/ACT nasal spray Place 2 sprays into both nostrils daily.   Yes [provider]  folic acid (FOLVITE) 1 MG tablet Take 1 tablet by mouth once daily 02/17/20  Yes Nandigam, Kavitha V, MD  Lactobacillus Rhamnosus, GG, (CULTURELLE PO) Take 1 capsule by mouth daily.   Yes [provider]  mercaptopurine (PURINETHOL) 50 MG tablet TAKE 1 TABLET BY MOUTH DAILY ON AN EMPTY STOMACH ONE  HOUR   BEFORE  OR  2  HOURS  AFTER  MEALS 09/22/19  Yes Nandigam, Venia Minks, MD  mesalamine (LIALDA) 1.2 g EC tablet Take 4 tablets (4.8 g total) by mouth daily with breakfast. 01/20/20  Yes Nandigam, Venia Minks, MD  mometasone-formoterol (DULERA) 100-5 MCG/ACT AERO Take 2 puffs first thing in am and then another 2 puffs about 12 hours later. 04/05/18  Yes Tanda Rockers, MD  mupirocin ointment (BACTROBAN) 2 % Apply topically as directed. 12/14/17  Yes [provider]  Needles & Syringes MISC Use to inject B12 weekly x 4 then every month 09/26/19  Yes Nandigam, Venia Minks, MD  ondansetron (ZOFRAN) 4 MG tablet Take 4 mg by mouth every 8 (eight) hours as needed for nausea.    Yes [provider]  polyethylene glycol powder (GLYCOLAX/MIRALAX) powder Reported on 10/01/2015 02/18/11  Yes Lafayette Dragon, MD  promethazine-dextromethorphan (PROMETHAZINE-DM) 6.25-15 MG/5ML syrup Take 5 mLs by mouth 4 (four) times daily as needed. 05/16/20  Yes [provider]  rosuvastatin (CRESTOR) 10 MG tablet Take 10 mg by mouth daily.     Yes [provider]  sucralfate (CARAFATE) 1 g tablet Take 1 tablet by mouth twice daily 10/03/19  Yes Nandigam, Venia Minks, MD  traMADol (ULTRAM) 50 MG tablet Take 1 tablet (50 mg total) by mouth 2 (two) times daily as needed. 09/22/19  Yes Nandigam, Venia Minks, MD  venlafaxine XR (EFFEXOR-XR) 150 MG 24 hr capsule Take 150 mg by mouth daily with breakfast.   Yes [provider]     Critical care time: 55 minutes

## 2020-05-25 NOTE — Plan of Care (Signed)
RN will continue to monitor patient's progression of care plan.

## 2020-05-25 NOTE — Progress Notes (Signed)
Patient's urine is very cloudy with a large amount of thick, pink-tinged sediment gathering along catheter tubing and in bag. Dr. Elsworth Soho notified by this RN, and orders placed to collect a urine culture. This RN will continue to monitor UOP.

## 2020-05-25 NOTE — Progress Notes (Signed)
Pharmacy Antibiotic Note  Kaitlyn Jennings is a 59 y.o. female admitted on 05/24/2020 with subglottic edema.  Intubated in the ED.  Pharmacy has been consulted for Vancomycin dosing for acute tracheitis.  Plan: Vancomycin 2033m IV x 1 loading dose followed by 1gm IV q12h Ceftriaxone per MD Monitor vancomycin levels as needed Follow renal function  Height: 5' 9"  (175.3 cm) Weight: 111.1 kg (245 lb) IBW/kg (Calculated) : 66.2  Temp (24hrs), Avg:98.5 F (36.9 C), Min:98.3 F (36.8 C), Max:98.7 F (37.1 C)  Recent Labs  Lab 05/24/20 2045  WBC 12.0*  CREATININE 0.67    Estimated Creatinine Clearance: 100.6 mL/min (by C-G formula based on SCr of 0.67 mg/dL).    Allergies  Allergen Reactions  . Amoxicillin Other (See Comments)    Abdominal pain Abdominal pain   . Molds & Smuts   . Oseltamivir     N/V stomach cramps   . Remicade [Infliximab] Other (See Comments)    REACTION: lupus, joint pain    Antimicrobials this admission: 9/17 Ceftriaxone >>   9/17 Vancomycin >>    Dose adjustments this admission:   Microbiology results: 9/16 Covid: negative   Thank you for allowing pharmacy to be a part of this patient's care.  PEverette Rank PharmD 05/25/2020 1:37 AM

## 2020-05-26 ENCOUNTER — Inpatient Hospital Stay (HOSPITAL_COMMUNITY): Payer: Medicare Other

## 2020-05-26 LAB — GLUCOSE, CAPILLARY
Glucose-Capillary: 183 mg/dL — ABNORMAL HIGH (ref 70–99)
Glucose-Capillary: 164 mg/dL — ABNORMAL HIGH (ref 70–99)
Glucose-Capillary: 137 mg/dL — ABNORMAL HIGH (ref 70–99)
Glucose-Capillary: 146 mg/dL — ABNORMAL HIGH (ref 70–99)
Glucose-Capillary: 150 mg/dL — ABNORMAL HIGH (ref 70–99)

## 2020-05-26 LAB — CBC WITH DIFFERENTIAL/PLATELET
Abs Immature Granulocytes: 0.12 10*3/uL — ABNORMAL HIGH (ref 0.00–0.07)
Basophils Absolute: 0 10*3/uL (ref 0.0–0.1)
Basophils Relative: 0 %
Eosinophils Absolute: 0 10*3/uL (ref 0.0–0.5)
Eosinophils Relative: 0 %
HCT: 34.2 % — ABNORMAL LOW (ref 36.0–46.0)
Hemoglobin: 10.9 g/dL — ABNORMAL LOW (ref 12.0–15.0)
Immature Granulocytes: 1 %
Lymphocytes Relative: 5 %
Lymphs Abs: 0.7 10*3/uL (ref 0.7–4.0)
MCH: 28.4 pg (ref 26.0–34.0)
MCHC: 31.9 g/dL (ref 30.0–36.0)
MCV: 89.1 fL (ref 80.0–100.0)
Monocytes Absolute: 0.4 10*3/uL (ref 0.1–1.0)
Monocytes Relative: 3 %
Neutro Abs: 13.1 10*3/uL — ABNORMAL HIGH (ref 1.7–7.7)
Neutrophils Relative %: 91 %
Platelets: 313 10*3/uL (ref 150–400)
RBC: 3.84 MIL/uL — ABNORMAL LOW (ref 3.87–5.11)
RDW: 14.6 % (ref 11.5–15.5)
WBC: 14.3 10*3/uL — ABNORMAL HIGH (ref 4.0–10.5)
nRBC: 0 % (ref 0.0–0.2)

## 2020-05-26 LAB — COMPREHENSIVE METABOLIC PANEL
ALT: 29 U/L (ref 0–44)
AST: 20 U/L (ref 15–41)
Albumin: 3.1 g/dL — ABNORMAL LOW (ref 3.5–5.0)
Alkaline Phosphatase: 73 U/L (ref 38–126)
Anion gap: 11 (ref 5–15)
BUN: 23 mg/dL — ABNORMAL HIGH (ref 6–20)
CO2: 24 mmol/L (ref 22–32)
Calcium: 9 mg/dL (ref 8.9–10.3)
Chloride: 103 mmol/L (ref 98–111)
Creatinine, Ser: 0.84 mg/dL (ref 0.44–1.00)
GFR calc Af Amer: >60 mL/min (ref >60)
GFR calc non Af Amer: >60 mL/min (ref >60)
Glucose, Bld: 172 mg/dL — ABNORMAL HIGH (ref 70–99)
Potassium: 3.9 mmol/L (ref 3.5–5.1)
Sodium: 138 mmol/L (ref 135–145)
Total Bilirubin: 0.3 mg/dL (ref 0.3–1.2)
Total Protein: 6.2 g/dL — ABNORMAL LOW (ref 6.5–8.1)

## 2020-05-26 LAB — MAGNESIUM
Magnesium: 2.1 mg/dL (ref 1.7–2.4)
Magnesium: 2.2 mg/dL (ref 1.7–2.4)

## 2020-05-26 LAB — URINE CULTURE: Culture: NO GROWTH

## 2020-05-26 LAB — PHOSPHORUS: Phosphorus: 3.2 mg/dL (ref 2.5–4.6)

## 2020-05-26 MED ORDER — VITAL HIGH PROTEIN PO LIQD
1000.0000 mL | ORAL | Status: DC
  Administered 2020-05-26: 1000 mL

## 2020-05-26 MED ORDER — PROSOURCE TF PO LIQD
45.0000 mL | Freq: Two times a day (BID) | ORAL | Status: DC
  Administered 2020-05-26 – 2020-05-27 (×2): 45 mL
  Filled 2020-05-26: qty 45

## 2020-05-26 NOTE — Progress Notes (Signed)
eLink Physician-Brief Progress Note Patient Name: Kaitlyn Jennings DOB: 1961-01-09 MRN: [id number]   Date of Service  05/26/2020  HPI/Events of Note  Patient needs a.m. labs ordered.  eICU Interventions   a.m. labs ordered.        Kerry Kass Neeko Pharo 05/26/2020, 5:52 AM

## 2020-05-26 NOTE — Progress Notes (Signed)
NAME:  BREUNNA NORDMANN, MRN:  [id number], DOB:  06-22-1961, LOS: 1 ADMISSION DATE:  05/24/2020, CONSULTATION DATE:  05/24/2020 CHIEF COMPLAINT:  SOB & swelling in throat  Brief History   59 y/o F presenting to [facility] ED with 2 days of swelling in her throat and increasing shortness of breath.  Past Medical History  Ulcerative Colitis Lupus- s/t Remicade IBS HLD GERD Asthma Arthritis- s/t Remicade Anxiety Pancreatitis  Significant Hospital Events   9/16 >> Difficult airway intubation for airway protection d/t subglottic edema 9/17 >> Admit to ICU  Consults:  ENT  Procedures:  ETT 9/16 >>   Significant Diagnostic Tests:  CT neck w/ contrast 9/16 >> narrowing of the glottic and subglottic laryngeal airway w/ minimum diameter of 4 mm   Micro Data:  COVID 9/16 >> negative Resp Panel 9/16 >> MRSA 9/17 >> positive  Antimicrobials:  Ceftriaxone 9/17 >>  Vancomycin 9/17 >>  Interim history/subjective:   Continues on the vent.  No acute events Has minimal cuff leak with no change compared to yesterday.  Objective   Blood pressure 107/62, pulse 67, temperature 98.5 F (36.9 C), temperature source Oral, resp. rate (!) 24, height 5' 9"  (1.753 m), weight 128.1 kg, last menstrual period 11/12/2016, SpO2 93 %.    Vent Mode: PSV;CPAP FiO2 (%):  [30 %-40 %] 40 % Set Rate:  [16 bmp] 16 bmp Vt Set:  [520 mL] 520 mL PEEP:  [5 cmH20] 5 cmH20 Pressure Support:  [5 cmH20] 5 cmH20 Plateau Pressure:  [17 cmH20-19 cmH20] 19 cmH20   Intake/Output Summary (Last 24 hours) at 05/26/2020 0931 Last data filed at 05/26/2020 0900 Gross per 24 hour  Intake 2763.68 ml  Output 825 ml  Net 1938.68 ml   Filed Weights   05/24/20 1639 05/25/20 0320 05/26/20 0500  Weight: 111.1 kg 123.6 kg 128.1 kg    Examination:  Gen:      No acute distress HEENT:  EOMI, sclera anicteric Neck:     No masses; no thyromegaly, ETT Lungs:    Clear to auscultation bilaterally; normal respiratory effort CV:          Regular rate and rhythm; no murmurs Abd:      + bowel sounds; soft, non-tender; no palpable masses, no distension Ext:    No edema; adequate peripheral perfusion Skin:      Warm and dry; no rash Neuro: Awake, responsive  Labs significant for glucose 164 BUN/creatinine 23/0.84 Chest x-ray 9/18 personally reviewed with low lung volumes, retrocardiac opacity.  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  Discussed with ENT.  Anticipate extubation in the next day or 2.  Ulcerative Colitis Unable to administer per OGT Resume when able to take PO's   Irritable Bowel Syndrome Dicyclomine  Hyperlipidemia Crestor  Steroid Induced Hyperglycemia Monitor sugars  Best practice:  Diet: Tube feeds Pain/Anxiety/Delirium protocol (if indicated): Fentanyl & Propofol VAP protocol (if indicated): Initiated DVT prophylaxis: Lovenox & SCD's GI prophylaxis: Protonix Glucose control: Q 4, range 140-180 Mobility: Bedrest Code Status: FULL Family Communication: Updated patient Disposition: ICU  Critical care time:    The patient is critically ill with multiple organ system failure and requires high complexity decision  making for assessment and support, frequent evaluation and titration of therapies, advanced monitoring, review of radiographic studies and interpretation of complex data.   Critical Care Time devoted to patient care services, exclusive of separately billable procedures, described in this note is 45 minutes.   Marshell Garfinkel MD  Pulmonary and Critical Care Please see [email or web address] for pager details.  05/26/2020, 9:38 AM

## 2020-05-26 NOTE — Progress Notes (Signed)
Subjective: Resting comfortably in bed. On vent.  Objective: Vital signs in last 24 hours: Temp:  [97.7 F (36.5 C)-98.7 F (37.1 C)] 98.5 F (36.9 C) (09/18 0822) Pulse Rate:  [56-84] 67 (09/18 0847) Resp:  [14-24] 24 (09/18 0847) BP: (92-159)/(52-78) 107/62 (09/18 0800) SpO2:  [91 %-98 %] 93 % (09/18 0847) FiO2 (%):  [30 %-50 %] 50 % (09/18 1113) Weight:  [128.1 kg] 128.1 kg (09/18 0500)  Physical Exam: General appearance: alert, cooperative. Eyes: Pupils are equal, round, reactive to light. Extraocular motion is intact.  Ears: Examination of the ears shows normal auricles and external auditory canals bilaterally.  Nose: Nasal examination shows normal mucosa, septum, turbinates.  Face: Facial examination shows no asymmetry. Palpation of the face elicit no significant tenderness.  Mouth: Oral cavity examination shows no mucosal injury. No significant trismus is noted. ET tube in place. Neck: Palpation of the neck reveals no lymphadenopathy or mass. The trachea is midline. The thyroid is not significantly enlarged.    Recent Labs    05/25/20 0335 05/26/20 0746  WBC 11.7* 14.3*  HGB 11.7* 10.9*  HCT 37.6 34.2*  PLT 326 313   Recent Labs    05/25/20 0335 05/26/20 0746  NA 136 138  K 3.6 3.9  CL 103 103  CO2 22 24  GLUCOSE 205* 172*  BUN 14 23*  CREATININE 0.72 0.84  CALCIUM 8.5* 9.0    Medications:  I have reviewed the patient's current medications. Scheduled: . aerochamber Z-Stat Plus/medium  1 each Other Once  . chlorhexidine gluconate (MEDLINE KIT)  15 mL Mouth Rinse BID  . Chlorhexidine Gluconate Cloth  6 each Topical Daily  . dexamethasone (DECADRON) injection  4 mg Intravenous Q6H  . dicyclomine  20 mg Per Tube TID AC & HS  . enoxaparin (LOVENOX) injection  40 mg Subcutaneous Q24H  . fentaNYL (SUBLIMAZE) injection  100 mcg Intravenous Once  . folic acid  1 mg Per Tube Daily  . mouth rinse  15 mL Mouth Rinse 10 times per day  . midazolam  1 mg  Intravenous Once  . mupirocin ointment  1 application Nasal BID  . pantoprazole (PROTONIX) IV  40 mg Intravenous QHS  . rosuvastatin  10 mg Per Tube Daily   Continuous: . azithromycin 500 mg (05/26/20 1047)  . cefTRIAXone (ROCEPHIN)  IV Stopped (05/26/20 0308)  . fentaNYL infusion INTRAVENOUS 100 mcg/hr (05/26/20 0900)  . lactated ringers 75 mL/hr at 05/26/20 0900  . propofol (DIPRIVAN) infusion Stopped (05/26/20 0850)    Assessment/Plan: Stridor and respiratory distress, likely secondary to subglottic edema.  - Intubated with a 7-0 ET tube. - Continue IV steroid - Consider extubation tomorrow if significant cuff leak is noted. - Will follow   LOS: 1 day   Kaitlyn Jennings W [name] 05/26/2020, 11:14 AM

## 2020-05-27 LAB — PHOSPHORUS
Phosphorus: 3.5 mg/dL (ref 2.5–4.6)
Phosphorus: 3.2 mg/dL (ref 2.5–4.6)

## 2020-05-27 LAB — BASIC METABOLIC PANEL
Anion gap: 11 (ref 5–15)
BUN: 30 mg/dL — ABNORMAL HIGH (ref 6–20)
CO2: 25 mmol/L (ref 22–32)
Calcium: 9 mg/dL (ref 8.9–10.3)
Chloride: 103 mmol/L (ref 98–111)
Creatinine, Ser: 0.76 mg/dL (ref 0.44–1.00)
GFR calc Af Amer: >60 mL/min (ref >60)
GFR calc non Af Amer: >60 mL/min (ref >60)
Glucose, Bld: 186 mg/dL — ABNORMAL HIGH (ref 70–99)
Potassium: 4.1 mmol/L (ref 3.5–5.1)
Sodium: 139 mmol/L (ref 135–145)

## 2020-05-27 LAB — CBC
HCT: 32.9 % — ABNORMAL LOW (ref 36.0–46.0)
Hemoglobin: 10.1 g/dL — ABNORMAL LOW (ref 12.0–15.0)
MCH: 28.1 pg (ref 26.0–34.0)
MCHC: 30.7 g/dL (ref 30.0–36.0)
MCV: 91.4 fL (ref 80.0–100.0)
Platelets: 270 10*3/uL (ref 150–400)
RBC: 3.6 MIL/uL — ABNORMAL LOW (ref 3.87–5.11)
RDW: 14.6 % (ref 11.5–15.5)
WBC: 11.5 10*3/uL — ABNORMAL HIGH (ref 4.0–10.5)
nRBC: 0 % (ref 0.0–0.2)

## 2020-05-27 LAB — MAGNESIUM
Magnesium: 2.3 mg/dL (ref 1.7–2.4)
Magnesium: 2.4 mg/dL (ref 1.7–2.4)

## 2020-05-27 LAB — GLUCOSE, CAPILLARY: Glucose-Capillary: 178 mg/dL — ABNORMAL HIGH (ref 70–99)

## 2020-05-27 MED ORDER — VITAL HIGH PROTEIN PO LIQD
1000.0000 mL | ORAL | Status: DC
  Administered 2020-05-27: 1000 mL

## 2020-05-27 MED ORDER — CHLORHEXIDINE GLUCONATE 0.12 % MT SOLN
OROMUCOSAL | Status: AC
  Administered 2020-05-27: 15 mL via OROMUCOSAL
  Filled 2020-05-27: qty 15

## 2020-05-27 NOTE — Progress Notes (Signed)
NAME:  Kaitlyn Jennings, MRN:  [id number], DOB:  [birth year], LOS: 2 ADMISSION DATE:  05/24/2020, CONSULTATION DATE:  05/24/2020 CHIEF COMPLAINT:  SOB & swelling in throat  Brief History   59 y/o F presenting to [facility] ED with 2 days of swelling in her throat and increasing shortness of breath.  Past Medical History  Ulcerative Colitis Lupus- s/t Remicade IBS HLD GERD Asthma Arthritis- s/t Remicade Anxiety Pancreatitis  Significant Hospital Events   9/16 >> Difficult airway intubation for airway protection d/t subglottic edema 9/17 >> Admit to ICU 9/18 Minimal cuff leak 9/19   Consults:  ENT  Procedures:  ETT 9/16 >>   Significant Diagnostic Tests:  CT neck w/ contrast 9/16 >> narrowing of the glottic and subglottic laryngeal airway w/ minimum diameter of 4 mm  Micro Data:  COVID 9/16 >> negative Resp Panel 9/16 >> MRSA 9/17 >> positive  Antimicrobials:  Ceftriaxone 9/17 >>  Vancomycin 9/17 >>  Interim history/subjective:   Weaning on the vent with no acute events Does have some cuff leak today  Objective   Blood pressure (!) 134/115, pulse 72, temperature 98 F (36.7 C), temperature source Axillary, resp. rate 15, height 5' 9"  (1.753 m), weight 129.9 kg, last menstrual period 11/12/2016, SpO2 92 %.    Vent Mode: PSV;CPAP FiO2 (%):  [40 %-50 %] 40 % Set Rate:  [16 bmp] 16 bmp Vt Set:  [520 mL] 520 mL PEEP:  [5 cmH20] 5 cmH20 Pressure Support:  [5 cmH20] 5 cmH20 Plateau Pressure:  [16 cmH20-30 cmH20] 23 cmH20   Intake/Output Summary (Last 24 hours) at 05/27/2020 0852 Last data filed at 05/27/2020 0600 Gross per 24 hour  Intake 2907.38 ml  Output 400 ml  Net 2507.38 ml   Filed Weights   05/25/20 0320 05/26/20 0500 05/27/20 0500  Weight: 123.6 kg 128.1 kg 129.9 kg    Examination:  Gen:      No acute distress HEENT:  EOMI, sclera anicteric Neck:     No masses; no thyromegaly, ETT Lungs:    Clear to auscultation bilaterally; normal respiratory effort CV:          Regular rate and rhythm; no murmurs Abd:      + bowel sounds; soft, non-tender; no palpable masses, no distension Ext:    No edema; adequate peripheral perfusion Skin:      Warm and dry; no rash Neuro: alert and oriented x 3 Psych: normal mood and affect  Labs significant for Elevated glucose at 186, WBC 11.5 No new imaging  Resolved Hospital Problem list     Assessment & Plan:   Acute Respiratory Failure due to subglottic edema 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  Discussed with ENT.  Anticipate extubation in the next day or 2  Ulcerative Colitis Unable to administer per OGT Resume when able to take PO's   Irritable Bowel Syndrome Dicyclomine  Hyperlipidemia Crestor  Steroid Induced Hyperglycemia Monitor sugars  Best practice:  Diet: Tube feeds Pain/Anxiety/Delirium protocol (if indicated): Fentanyl & Propofol VAP protocol (if indicated): Initiated DVT prophylaxis: Lovenox & SCD's GI prophylaxis: Protonix Glucose control: Q 4, range 140-180 Mobility: Bedrest Code Status: FULL Family Communication: Updated patient Disposition: ICU  Critical care time:    The patient is critically ill with multiple organ system failure and requires  high complexity decision making for assessment and support, frequent evaluation and titration of therapies, advanced monitoring, review of radiographic studies and interpretation of complex data.   Critical Care Time devoted to patient care services, exclusive of separately billable procedures, described in this note is 45 minutes.   Marshell Garfinkel MD [facility] Please see [email or web address] for pager details.  05/27/2020, 8:52 AM

## 2020-05-27 NOTE — Progress Notes (Signed)
Nutrition Follow-up  RD working remotely.  DOCUMENTATION CODES:   Obesity unspecified  INTERVENTION:  - will adjust TF regimen: Vital High Protein @ 65 ml/hr. - this regimen will provide 1560 kcal, 136 grams protein, and 1304 ml free water.  - free water flush, if desired, to be per CCM.   NUTRITION DIAGNOSIS:   Inadequate oral intake related to inability to eat as evidenced by NPO status. -ongoing  GOAL:   Provide needs based on ASPEN/SCCM guidelines -to be met with TF regimen   MONITOR:   Vent status, TF tolerance, Labs, Weight trends  REASON FOR ASSESSMENT:   Consult Enteral/tube feeding initiation and management  ASSESSMENT:   59 year old female with medical history of IBS/ulcerative colitis, anxiety, pancreatitis, vitamin B12 deficiency, chronic gastritis, GERD, asthma, arthritis, lupus, and hyperlipidemia. She presented to the ED with 1-week hx of upper respiratory-type symptoms of rhinorrhea, headaches, sore throat, and generalized malaise. In the 48 hours PTA she had increased difficulty swallowing. CT of neck done and patient was intubated in the ED by ENT under anesthesia.  Patient remains intubated with OGT (gastric) in place. TF started per protocol yesterday at ~1615: Vital High Protein @ 40 ml/hr with 45 ml Prosource TF BID. This regimen is providing 1040 kcal, 106 grams protein, and 802 ml free water.   No BM since PTA. Weight +6.3 kg/14 lb since 91/7. No information documented in the edema section of flow sheet at this time. Current estimated nutrition needs remain appropriate.   Per notes: - acute respiratory failure d/t subglottic edema - possible extubation in 1-2 days - hx of ulcerative colitis, IBS - steroid-induced hyperglycemia   Labs reviewed; BUN: 30 mg/dl. Medications reviewed; 1 mg folvite/day,  IVF; LR @ 75 ml/hr.    Diet Order:   Diet Order            Diet NPO time specified  Diet effective now                 EDUCATION NEEDS:    No education needs have been identified at this time  Skin:  Skin Assessment: Reviewed RN Assessment  Last BM:  PTA/unknown  Height:   Ht Readings from Last 1 Encounters:  05/24/20 _0  (1.753 m)    Weight:   Wt Readings from Last 1 Encounters:  05/27/20 129.9 kg     Estimated Nutritional Needs:  Kcal:  1360-1730 kcal Protein:  >/= 132 grams Fluid:  >/= 1.8 L/day      Jarome Matin, MS, RD, LDN, CNSC Inpatient Clinical Dietitian RD pager # available in AMION  After hours/weekend pager # available in [facility]

## 2020-05-28 LAB — GLUCOSE, CAPILLARY
Glucose-Capillary: 175 mg/dL — ABNORMAL HIGH (ref 70–99)
Glucose-Capillary: 148 mg/dL — ABNORMAL HIGH (ref 70–99)
Glucose-Capillary: 138 mg/dL — ABNORMAL HIGH (ref 70–99)
Glucose-Capillary: 161 mg/dL — ABNORMAL HIGH (ref 70–99)
Glucose-Capillary: 161 mg/dL — ABNORMAL HIGH (ref 70–99)
Glucose-Capillary: 167 mg/dL — ABNORMAL HIGH (ref 70–99)
Glucose-Capillary: 167 mg/dL — ABNORMAL HIGH (ref 70–99)
Glucose-Capillary: 191 mg/dL — ABNORMAL HIGH (ref 70–99)
Glucose-Capillary: 143 mg/dL — ABNORMAL HIGH (ref 70–99)
Glucose-Capillary: 161 mg/dL — ABNORMAL HIGH (ref 70–99)
Glucose-Capillary: 139 mg/dL — ABNORMAL HIGH (ref 70–99)

## 2020-05-28 LAB — BASIC METABOLIC PANEL
Anion gap: 8 (ref 5–15)
BUN: 27 mg/dL — ABNORMAL HIGH (ref 6–20)
CO2: 27 mmol/L (ref 22–32)
Calcium: 8.6 mg/dL — ABNORMAL LOW (ref 8.9–10.3)
Chloride: 104 mmol/L (ref 98–111)
Creatinine, Ser: 0.55 mg/dL (ref 0.44–1.00)
GFR calc Af Amer: >60 mL/min (ref >60)
GFR calc non Af Amer: >60 mL/min (ref >60)
Glucose, Bld: 156 mg/dL — ABNORMAL HIGH (ref 70–99)
Potassium: 4 mmol/L (ref 3.5–5.1)
Sodium: 139 mmol/L (ref 135–145)

## 2020-05-28 LAB — MAGNESIUM: Magnesium: 2.5 mg/dL — ABNORMAL HIGH (ref 1.7–2.4)

## 2020-05-28 LAB — PHOSPHORUS: Phosphorus: 2.7 mg/dL (ref 2.5–4.6)

## 2020-05-28 MED ORDER — CHLORHEXIDINE GLUCONATE 0.12 % MT SOLN
15.0000 mL | Freq: Two times a day (BID) | OROMUCOSAL | Status: DC
  Administered 2020-05-28 – 2020-05-29 (×3): 15 mL via OROMUCOSAL
  Filled 2020-05-28 (×3): qty 15

## 2020-05-28 MED ORDER — ROCURONIUM BROMIDE 10 MG/ML (PF) SYRINGE
PREFILLED_SYRINGE | INTRAVENOUS | Status: AC
  Filled 2020-05-28: qty 10

## 2020-05-28 MED ORDER — STERILE WATER FOR INJECTION IJ SOLN
INTRAMUSCULAR | Status: AC
  Filled 2020-05-28: qty 10

## 2020-05-28 MED ORDER — ORAL CARE MOUTH RINSE
15.0000 mL | Freq: Two times a day (BID) | OROMUCOSAL | Status: DC
  Administered 2020-05-28 – 2020-05-29 (×3): 15 mL via OROMUCOSAL

## 2020-05-28 MED ORDER — LIDOCAINE HCL (CARDIAC) PF 100 MG/5ML IV SOSY
PREFILLED_SYRINGE | INTRAVENOUS | Status: AC
  Filled 2020-05-28: qty 5

## 2020-05-28 MED ORDER — DICYCLOMINE HCL 20 MG PO TABS
20.0000 mg | ORAL_TABLET | Freq: Three times a day (TID) | ORAL | Status: DC
  Administered 2020-05-28 – 2020-05-30 (×8): 20 mg via ORAL
  Filled 2020-05-28 (×11): qty 1

## 2020-05-28 MED ORDER — FOLIC ACID 1 MG PO TABS
1.0000 mg | ORAL_TABLET | Freq: Every day | ORAL | Status: DC
  Administered 2020-05-28 – 2020-05-30 (×3): 1 mg via ORAL
  Filled 2020-05-28 (×3): qty 1

## 2020-05-28 MED ORDER — MIDAZOLAM HCL 2 MG/2ML IJ SOLN
INTRAMUSCULAR | Status: AC
  Filled 2020-05-28: qty 4

## 2020-05-28 MED ORDER — PANTOPRAZOLE SODIUM 40 MG IV SOLR
40.0000 mg | Freq: Two times a day (BID) | INTRAVENOUS | Status: DC
  Administered 2020-05-28 – 2020-05-30 (×4): 40 mg via INTRAVENOUS
  Filled 2020-05-28 (×4): qty 40

## 2020-05-28 MED ORDER — ETOMIDATE 2 MG/ML IV SOLN
INTRAVENOUS | Status: AC
  Filled 2020-05-28: qty 20

## 2020-05-28 MED ORDER — ROSUVASTATIN CALCIUM 10 MG PO TABS
10.0000 mg | ORAL_TABLET | Freq: Every day | ORAL | Status: DC
  Administered 2020-05-28 – 2020-05-30 (×3): 10 mg via ORAL
  Filled 2020-05-28 (×3): qty 1

## 2020-05-28 NOTE — TOC Progression Note (Signed)
Transition of Care [facility]) - Progression Note    Patient Details  Name: EMANUELLA NICKLE MRN: [id number] Date of Birth: Feb 02, 1961  Transition of Care [facility] CM/SW Contact  Leeroy Cha, RN Phone Number: 05/28/2020, 10:52 AM  Clinical Narrative:    Extubated this am at 1039-tolerated well now on 4l/o2/, iv decadron,iv zithromax,iv rocephin, iv lr at kvo Following for progression and toc needs.   Expected Discharge Plan: Home/Self Care Barriers to Discharge: Continued Medical Work up  Expected Discharge Plan and Services Expected Discharge Plan: Home/Self Care   Discharge Planning Services: CM Consult   Living arrangements for the past 2 months: Single Family Home                                       Social Determinants of Health (SDOH) Interventions    Readmission Risk Interventions No flowsheet data found.

## 2020-05-28 NOTE — Progress Notes (Addendum)
Assessed patient post extubation.  Patient resting in bed, normal work of breathing on [place] O2.  No stridor or accessory muscle use.  Will continue to monitor.    Plan for increase in PPI to BID x2 weeks then daily.     Will transfer to SDU and to [facility].  Monitor airway overnight in ICU/SDU.     Noe Gens, MSN, NP-C [facility] 05/28/2020, 12:42 PM   Please see [email or web address] for pager details.

## 2020-05-28 NOTE — Progress Notes (Signed)
Subjective: Resting comfortably in bed. Extubated this morning. Breathing well on room air.  Objective: Vital signs in last 24 hours: Temp:  [97.5 F (36.4 C)-98.2 F (36.8 C)] 98.1 F (36.7 C) (09/20 0800) Pulse Rate:  [52-96] 58 (09/20 0800) Resp:  [11-25] 15 (09/20 0800) BP: (113-150)/(62-107) 147/78 (09/20 0800) SpO2:  [86 %-100 %] 100 % (09/20 0930) FiO2 (%):  [40 %] 40 % (09/20 0800) Weight:  [131.7 kg] 131.7 kg (09/20 0500)  Physical Exam: General appearance:alert, cooperative. Eyes: Pupils are equal, round, reactive to light. Extraocular motion is intact.  Ears: Examination of the ears shows normal auricles and external auditory canals bilaterally.  Nose: Nasal examination shows normal mucosa, septum, turbinates.  Face: Facial examination shows no asymmetry. Palpation of the face elicit no significant tenderness.  Mouth: Oral cavity examination shows no mucosal injury. No significant trismus is noted.  Neck: Palpation of the neck reveals no lymphadenopathy or mass. The trachea is midline. The thyroid is not significantly enlarged. No stridor.   Recent Labs    05/26/20 0746 05/27/20 0322  WBC 14.3* 11.5*  HGB 10.9* 10.1*  HCT 34.2* 32.9*  PLT 313 270   Recent Labs    05/26/20 0746 05/27/20 0322  NA 138 139  K 3.9 4.1  CL 103 103  CO2 24 25  GLUCOSE 172* 186*  BUN 23* 30*  CREATININE 0.84 0.76  CALCIUM 9.0 9.0    Medications:  I have reviewed the patient's current medications. Scheduled: . aerochamber Z-Stat Plus/medium  1 each Other Once  . chlorhexidine gluconate (MEDLINE KIT)  15 mL Mouth Rinse BID  . Chlorhexidine Gluconate Cloth  6 each Topical Daily  . dexamethasone (DECADRON) injection  4 mg Intravenous Q6H  . dicyclomine  20 mg Oral TID AC & HS  . enoxaparin (LOVENOX) injection  40 mg Subcutaneous Q24H  . folic acid  1 mg Oral Daily  . lidocaine (cardiac) 100 mg/56m      . mouth rinse  15 mL Mouth Rinse 10 times per day  . mupirocin  ointment  1 application Nasal BID  . pantoprazole (PROTONIX) IV  40 mg Intravenous QHS  . rosuvastatin  10 mg Oral Daily  . sterile water (preservative free)       Continuous: . azithromycin 500 mg (05/28/20 1036)  . cefTRIAXone (ROCEPHIN)  IV Stopped (05/28/20 0249)  . lactated ringers 75 mL/hr at 05/28/20 0800    Assessment/Plan: Pt admitted for stridor and respiratory distress secondary to subglottic edema.  - Extubated this AM. Doing well. - No significant stridor. - Her voice is hoarse. - Consider d/c home on prednisone dose pak. - Patient may follow up with me as an outpatient after discharge.   LOS: 3 days   Kaitlyn Jennings W Avonell Lenig 05/28/2020, 10:39 AM

## 2020-05-28 NOTE — Procedures (Signed)
Extubation Procedure Note  Patient Details:   Name: DONITA NEWLAND DOB: 1960/10/29 MRN: [id number]   Airway Documentation:    Vent end date: 05/28/20 Vent end time: 0930   Evaluation  O2 sats: stable throughout Complications: No apparent complications Patient did tolerate procedure well. Bilateral Breath Sounds: Clear, Diminished   Yes   Pt was extubated to 4L [place] per MD order. Pt was suctioned and had a small cuff leak heard prior to extubation. MD aware of edema and at bedside for procedure. Pt tolerated well, was able to speak afterwards and no stridor heard from RT or MD at this time. RT will continue to monitor.   Aaima Gaddie A Danisa Kopec 05/28/2020, 9:37 AM

## 2020-05-28 NOTE — Progress Notes (Signed)
   NAME:  Kaitlyn Jennings, MRN:  [id number], DOB:  1961-05-18, LOS: 3 ADMISSION DATE:  05/24/2020, CONSULTATION DATE:  05/24/2020 CHIEF COMPLAINT:  SOB & swelling in throat  Brief History   59 [facility]/o F [facility] with 2 days of swelling in her throat and increasing shortness of breath. Intubated for respiratory distress and concern for subglottic edema.   Past Medical History  Ulcerative Colitis Lupus- s/t Remicade IBS HLD GERD Asthma Arthritis- s/t Remicade Anxiety Pancreatitis  Significant Hospital Events   9/16 Difficult airway intubation for airway protection d/t subglottic edema 9/17 Admit to ICU 9/18 Minimal cuff leak 9/20 Extubated  Consults:  ENT  Procedures:  ETT 9/16 >> 9/20  Significant Diagnostic Tests:  CT neck w/ contrast 9/16 >> narrowing of the glottic and subglottic laryngeal airway w/ minimum diameter of 4 mm  Micro Data:  COVID 9/16 >> negative Resp Panel 9/16 >> MRSA 9/17 >> positive  Antimicrobials:  Ceftriaxone 9/17 >>  Azithromycin 9/17 >>  Interim history/subjective:  No acute events  Afebrile  Pt communicative despite being on vent   Objective   Blood pressure (!) 147/78, pulse (!) 58, temperature 98.1 F (36.7 C), temperature source Axillary, resp. rate 15, height 5' 9"  (1.753 m), weight 131.7 kg, last menstrual period 11/12/2016, SpO2 100 %.    Vent Mode: PSV;CPAP FiO2 (%):  [40 %] 40 % Set Rate:  [16 bmp] 16 bmp Vt Set:  [520 mL] 520 mL PEEP:  [5 cmH20] 5 cmH20 Pressure Support:  [5 cmH20] 5 cmH20 Plateau Pressure:  [16 cmH20-22 cmH20] 16 cmH20   Intake/Output Summary (Last 24 hours) at 05/28/2020 0908 Last data filed at 05/28/2020 0800 Gross per 24 hour  Intake 2677.51 ml  Output 1075 ml  Net 1602.51 ml   Filed Weights   05/26/20 0500 05/27/20 0500 05/28/20 0500  Weight: 128.1 kg 129.9 kg 131.7 kg    Examination:  General: adult female lying in bed on vent in NAD HEENT: MM pink/moist, ETT, good dentition, anicteric    Neuro: Awake / alert, communicates by mouthing words, MAE CV: s1s2 RRR, no m/r/g PULM: non-labored on vent, able to pull 1.5L on PSV, small air leak noted on exam with RT GI: soft, bsx4 active  Extremities: warm/dry, no edema  Skin: no rashes or lesions  Resolved Hospital Problem list     Assessment & Plan:   Acute Respiratory Failure due to subglottic edema 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  RVP negative.  -PSV wean with plan for extubation  -anesthesia notified of extubation and potential for difficult airway  -continue decadron 9/20, consider reduction vs stopping 9/21  -D4/5 abx  -follow intermittent CXR  -appreciate ENT assistance with patient care  Ulcerative Colitis -resume home meds when cleared for PO's   Irritable Bowel Syndrome -continue dicyclomine   Hyperlipidemia -crestor   Steroid Induced Hyperglycemia -follow glucose   Best practice:  Diet: NPO x4 hours, then advance as tolerated Pain/Anxiety/Delirium protocol (if indicated): n/a VAP protocol (if indicated): n/a DVT prophylaxis: Lovenox & SCD's GI prophylaxis: Protonix Glucose control: Q 4, range 140-180 Mobility: Bedrest Code Status: FULL Family Communication: Patient updated on plan of care  Disposition: ICU  Critical care time: 76 minutes    Noe Gens, MSN, NP-C [facility] 05/28/2020, 9:44 AM   Please see [email or web address] for pager details.

## 2020-05-28 NOTE — Evaluation (Signed)
Clinical/Bedside Swallow Evaluation Patient Details  Name: Kaitlyn Jennings MRN: [id number] Date of Birth: 03-Oct-1960  Today's Date: 05/28/2020 Time: SLP Start Time (ACUTE ONLY): 1450 SLP Stop Time (ACUTE ONLY): 1541 SLP Time Calculation (min) (ACUTE ONLY): 51 min  Past Medical History:  Past Medical History:  Diagnosis Date  . Anxiety   . 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 Asthma   . B12 deficiency   . Chronic gastritis   . GERD (gastroesophageal reflux disease)   . Hyperlipidemia   . IBS (irritable bowel syndrome)   . Lupus ([place])    secondary to Rmicade  . Pancreatitis   . Rectal polyp 10/01/2015  . Ulcerative colitis    Past Surgical History:  Past Surgical History:  Procedure Laterality Date  . BREAST CYST ASPIRATION     left  . ESOPHAGUS SURGERY    . SINUS SURGERY WITH INSTATRAK     HPI:  59 yo female adm to [facility] with stridor, had one week h/o upper respiratory symptoms including rhinorrhea, HAs, sore throat and generalized malaise.  Pt with subglottic edema - airway opening 4 mm requiring intubation by ENT under anesthesia, intubated 9/16-9/20 am.  Swallow eval ordered.  Pt reports h/o lupus that has resolved since being off Remicade, IBS, asthma, broncithis, anxiety, pancreatitis.  She was noted to have debris in her upper airway on imaging prior to intubation.  Pt admits to h/o GERD and takes a PPI once a day- BID if needed.  She reports MD is aware of this medication regimen * her PCP.  Pt denies dysphagia - does admit to some throat clearing that is chronic. Pt also reports h/o esophageal "surgery" approximately 20 years ago where scarring was noted. She reports her voice becomes very hoarse when she gets bronchitis and she states she is sick "all of the time".  Imaging shows LLL pna vs ATX.   Assessment / Plan / Recommendation Clinical Impression  Pt presents with very hoarse voice s/p intubation x4 days for subglottic edema of unknown cause.  Pt admits to hoareness  to nearly current level when she has bronchitis. OME unremarkable and pt passed Yale swallow challenge on 2nd attempt.    She admit to baseline coughing when taking pills with carbonated beverages, sensing "bubbling" in her proximal esophagus and having difficulty taking pills with water (due to pills lodging in proximal esophagus).  She reports better tolerance with medication with cranberry juice or chocolate milk due to thickness. Her premorbid dysphagia symptoms appear consistent with esophageal esophageal deficits.    Today pt consumed 6 ounces water, 4 ounces juice, 4 graham crackers, 4 ounces orange sherbert and 2 ounces applesauce with subtle throat clearing *which she states is normal* and subtle cough x2 only.  She does report slow clearance of food/drink - whichis new with this event- pointing to proximal esophagus or distal pharynx thus suspect possible mild edema that may impact swallowing/sensation, etc.  No increased dyspnea noted during intake however, educated pt to importance of swallow/respiratory reciprocity for airway protection with po thus need to monitor.   Recommend full liquids with strict precautions and anticipate dietary advancement will be appropriate as possible subglottic/pharyngeal edema decreases.  Question if she may have some stridorous inhalation - but she states her respiratory pattern is normal for her. Educated pt to importance of strict aspiration and reflux precautions.  Of note, she was advised by Dr Melvyn Novas in 2019/8 to take her PPI BID per her statement, but reports  she generally only takes if once a day.       Will follow up to assure po tolerance and assure indication for instrumental swallow evaluation is not indicated. Using teach back, pt agreeable to plan.    SLP advised pt to follow up with Dr Melvyn Novas as an OP as she has not seen him since 04/2018 when she saw him for upper airway cough syndrome.    SLP Visit Diagnosis: Dysphagia, unspecified (R13.10)     Aspiration Risk  Mild aspiration risk    Diet Recommendation Other (Comment) (full liquids advised initially given pt's sensation of slow clearance at proximal esophagus or distal pharynx - pt reports "near intubation site")   Liquid Administration via: Cup;No straw Medication Administration: Whole meds with puree (or with cranberry juice, chocolate milk, etc) Supervision: Patient able to self feed Compensations: Slow rate;Small sips/bites Postural Changes: Seated upright at 90 degrees;Remain upright for at least 30 minutes after po intake    Other  Recommendations Oral Care Recommendations: Oral care BID   Follow up Recommendations    TBD, advised to follow up with pulmonologist as an OP    Frequency and Duration min 1 x/week  1 week       Prognosis Prognosis for Safe Diet Advancement: Good      Swallow Study   General Date of Onset: 05/28/20 HPI: 59 yo female adm to [facility] with stridor, had one week h/o upper respiratory symptoms including rhinorrhea, HAs, sore throat and generalized malaise.  Pt with subglottic edema - airway opening 4 mm requiring intubation by ENT under anesthesia, intubated 9/16-9/20 am.  Swallow eval ordered.  Pt reports h/o lupus that has resolved since being off Remicade, IBS, asthma, broncithis, anxiety, pancreatitis.  She was noted to have debris in her upper airway on imaging prior to intubation.  Pt admits to h/o GERD and takes a PPI once a day- BID if needed.  She reports MD is aware of this medication regimen * her PCP.  Pt denies dysphagia - does admit to some throat clearing that is chronic. Pt also reports h/o esophageal "surgery" approximately 20 years ago where scarring was noted. She reports her voice becomes very hoarse when she gets bronchitis and she states she is sick "all of the time".  Imaging shows LLL pna vs ATX. Type of Study: Bedside Swallow Evaluation Diet Prior to this Study: NPO Temperature Spikes Noted: No Respiratory Status: Room  air History of Recent Intubation: Yes Length of Intubations (days): 4 days Date extubated: 05/28/20 Behavior/Cognition: Alert;Cooperative;Pleasant mood Oral Cavity Assessment: Within Functional Limits Oral Care Completed by SLP: No Oral Cavity - Dentition: Adequate natural dentition Vision: Functional for self-feeding Self-Feeding Abilities: Able to feed self Patient Positioning: Upright in bed Baseline Vocal Quality: Hoarse;Low vocal intensity Volitional Cough: Strong Volitional Swallow: Able to elicit    Oral/Motor/Sensory Function Overall Oral Motor/Sensory Function: Within functional limits   Ice Chips Ice chips: Not tested   Thin Liquid Thin Liquid: Impaired Pharyngeal  Phase Impairments: Throat Clearing - Immediate Other Comments: frequent throat clearing after intake - noted to be after approx 25% of boluses, pt states this is normal for her    Nectar Thick Nectar Thick Liquid: Not tested   Honey Thick Honey Thick Liquid: Not tested   Puree Puree: Within functional limits Presentation: Self Fed;Spoon   Solid     Solid: Within functional limits Presentation: Self Fed      Macario Golds 05/28/2020,4:07 PM  Kathleen Lime, MS [facility]  [place] Office [phone]

## 2020-05-29 DIAGNOSIS — Z4659 Encounter for fitting and adjustment of other gastrointestinal appliance and device: Secondary | ICD-10-CM

## 2020-05-29 DIAGNOSIS — Z789 Other specified health status: Secondary | ICD-10-CM

## 2020-05-29 LAB — CBC
HCT: 33.9 % — ABNORMAL LOW (ref 36.0–46.0)
Hemoglobin: 10.8 g/dL — ABNORMAL LOW (ref 12.0–15.0)
MCH: 28.1 pg (ref 26.0–34.0)
MCHC: 31.9 g/dL (ref 30.0–36.0)
MCV: 88.3 fL (ref 80.0–100.0)
Platelets: 286 10*3/uL (ref 150–400)
RBC: 3.84 MIL/uL — ABNORMAL LOW (ref 3.87–5.11)
RDW: 14.1 % (ref 11.5–15.5)
WBC: 10.6 10*3/uL — ABNORMAL HIGH (ref 4.0–10.5)
nRBC: 0 % (ref 0.0–0.2)

## 2020-05-29 LAB — GLUCOSE, CAPILLARY: Glucose-Capillary: 144 mg/dL — ABNORMAL HIGH (ref 70–99)

## 2020-05-29 MED ORDER — BUPROPION HCL ER (XL) 150 MG PO TB24
150.0000 mg | ORAL_TABLET | Freq: Every day | ORAL | Status: DC
  Administered 2020-05-29 – 2020-05-30 (×2): 150 mg via ORAL
  Filled 2020-05-29 (×2): qty 1

## 2020-05-29 MED ORDER — PHENOL 1.4 % MT LIQD
1.0000 | OROMUCOSAL | Status: DC | PRN
  Administered 2020-05-29: 1 via OROMUCOSAL
  Filled 2020-05-29: qty 177

## 2020-05-29 MED ORDER — DEXAMETHASONE SODIUM PHOSPHATE 4 MG/ML IJ SOLN
4.0000 mg | Freq: Two times a day (BID) | INTRAMUSCULAR | Status: DC
  Administered 2020-05-29 – 2020-05-30 (×2): 4 mg via INTRAVENOUS
  Filled 2020-05-29 (×2): qty 1

## 2020-05-29 MED ORDER — SUCRALFATE 1 G PO TABS
1.0000 g | ORAL_TABLET | Freq: Two times a day (BID) | ORAL | Status: DC
  Administered 2020-05-29: 1 g via ORAL
  Filled 2020-05-29 (×2): qty 1

## 2020-05-29 MED ORDER — MESALAMINE 1.2 G PO TBEC
4.8000 g | DELAYED_RELEASE_TABLET | Freq: Every day | ORAL | Status: DC
  Filled 2020-05-29 (×2): qty 4

## 2020-05-29 NOTE — Evaluation (Signed)
Physical Therapy Evaluation Patient Details Name: Kaitlyn Jennings MRN: [id number] DOB: 1960/12/23 Today's Date: 05/29/2020   History of Present Illness  59 yo female admitted with acute respiratory failure 2* subglottic edema. Intubated 9/16. Extubated 9/20. Hx of ulcerative colitis, asthma, lupus  Clinical Impression  On eval, pt was Min guard assist for mobility. She walked ~50 feet with support of IV pole. Slow, cautious gait. Pt fatigues fairly easily. O2 90% on RA, dyspnea 2/4 with ambulation. Pt is very pleasant and motivated to get better. Will plan to follow and progress activity as tolerated.     Follow Up Recommendations Home health PT    Equipment Recommendations  None recommended by PT    Recommendations for Other Services       Precautions / Restrictions Precautions Precautions: Fall Restrictions Weight Bearing Restrictions: No      Mobility  Bed Mobility               General bed mobility comments: oob in recliner  Transfers Overall transfer level: Needs assistance   Transfers: Sit to/from Stand Sit to Stand: Min guard         General transfer comment: Min guard for safety  Ambulation/Gait Ambulation/Gait assistance: Min guard Gait Distance (Feet): 50 Feet Assistive device: IV Pole Gait Pattern/deviations: Step-through pattern;Decreased stride length     General Gait Details: Unsteady at times. Fatigues fairly easily. O2 90% on RA during ambulation.  Stairs            Wheelchair Mobility    Modified Rankin (Stroke Patients Only)       Balance Overall balance assessment: Needs assistance         Standing balance support: Single extremity supported Standing balance-Leahy Scale: Fair                               Pertinent Vitals/Pain Pain Assessment: No/denies pain    Home Living Family/patient expects to be discharged to:: Private residence Living Arrangements: Spouse/significant other Available Help at  Discharge: Family Type of Home: House         Home Equipment: None      Prior Function Level of Independence: Independent               Hand Dominance        Extremity/Trunk Assessment   Upper Extremity Assessment Upper Extremity Assessment: Overall WFL for tasks assessed    Lower Extremity Assessment Lower Extremity Assessment: Generalized weakness    Cervical / Trunk Assessment Cervical / Trunk Assessment: Normal  Communication   Communication: No difficulties  Cognition Arousal/Alertness: Awake/alert Behavior During Therapy: WFL for tasks assessed/performed Overall Cognitive Status: Within Functional Limits for tasks assessed                                        General Comments      Exercises     Assessment/Plan    PT Assessment Patient needs continued PT services  PT Problem List Decreased strength;Decreased mobility;Decreased activity tolerance;Decreased balance;Decreased knowledge of use of DME       PT Treatment Interventions DME instruction;Gait training;Therapeutic activities;Therapeutic exercise;Patient/family education;Balance training;Functional mobility training    PT Goals (Current goals can be found in the Care Plan section)  Acute Rehab PT Goals Patient Stated Goal: regain plof PT Goal Formulation: With patient Time For Goal  Achievement: 06/12/20 Potential to Achieve Goals: Good    Frequency Min 3X/week   Barriers to discharge        Co-evaluation               AM-PAC PT "6 Clicks" Mobility  Outcome Measure Help needed turning from your back to your side while in a flat bed without using bedrails?: A Little Help needed moving from lying on your back to sitting on the side of a flat bed without using bedrails?: A Little Help needed moving to and from a bed to a chair (including a wheelchair)?: A Little Help needed standing up from a chair using your arms (e.g., wheelchair or bedside chair)?: A  Little Help needed to walk in hospital room?: A Little Help needed climbing 3-5 steps with a railing? : A Little 6 Click Score: 18    End of Session   Activity Tolerance: Patient tolerated treatment well Patient left: in chair;with call bell/phone within reach        Time: 1125-1150 PT Time Calculation (min) (ACUTE ONLY): 25 min   Charges:   PT Evaluation $PT Eval Low Complexity: 1 Low PT Treatments $Gait Training: 8-22 mins          Doreatha Massed, PT Acute Rehabilitation  Office: [phone] Pager: [phone]

## 2020-05-29 NOTE — Progress Notes (Signed)
PROGRESS NOTE    Kaitlyn Jennings  IDP:[id number] DOB: 05/08/1961 DOA: 05/24/2020 PCP: Manfred Shirts, PA    Brief Narrative: Patient admitted to PCCM on 05/24/2020 with acute respiratory failure secondary to subglottic edema.  Patient was emergently intubated on admission. She is a 59 year old female with history of lupus IBS ulcerative colitis history of asthma admitted with subglottic edema with acute hypoxic respiratory failure likely secondary to viral etiology.  She was intubated treated with antibiotics and steroids and extubated 05/28/2020.  She was seen in consultation by ENT. CT neck narrowing of the glottic and subglottic laryngeal airway with minimum diameter 4 mm. MRSA positive respiratory virus panel Covid negative  Assessment & Plan:   Active Problems:   Acute respiratory failure (HCC)   Stridor   #1 acute hypoxic respiratory failure due to subglottic edema thought to be secondary to viral etiology/tracheitis versus tracheal stenosis treated with intubation steroids and IV antibiotics. Respiratory virus panel was negative Covid was negative MRSA was positive. Patient seen by ENT Will decrease Decadron to every 12 ENT recommends to continue prednisone taper on discharge  #2 history of IBS/ulcerative colitis start mesalamine  #3 history of hyperlipidemia continue statins  #4 depression restart home meds   Nutrition Problem: Inadequate oral intake Etiology: inability to eat     Signs/Symptoms: NPO status    Interventions: Refer to RD note for recommendations  Estimated body mass index is 42.88 kg/m as calculated from the following:   Height as of this encounter: 5' 9"  (1.753 m).   Weight as of this encounter: 131.7 kg.  DVT prophylaxis: Lovenox  code Status: Full code Family Communication: None at bedside Disposition Plan:  Status is: Inpatient  Dispo: The patient is from: Home              Anticipated d/c is to: Home              Anticipated d/c date  is: 1 day              Patient currently is not medically stable to d/c.   Consultants: PCCM and ENT  Procedures: Intubated 05/24/2020 extubated 05/28/2020 Antimicrobials: Anti-infectives (From admission, onward)   Start     Dose/Rate Route Frequency Ordered Stop   05/25/20 1500  vancomycin (VANCOCIN) IVPB 1000 mg/200 mL premix  Status:  Discontinued        1,000 mg 200 mL/hr over 60 Minutes Intravenous Every 12 hours 05/25/20 0137 05/25/20 1347   05/25/20 1500  azithromycin (ZITHROMAX) 500 mg in sodium chloride 0.9 % 250 mL IVPB        500 mg 250 mL/hr over 60 Minutes Intravenous Daily 05/25/20 1347 05/29/20 1208   05/25/20 0200  cefTRIAXone (ROCEPHIN) 1 g in sodium chloride 0.9 % 100 mL IVPB        1 g 200 mL/hr over 30 Minutes Intravenous Every 24 hours 05/25/20 0130     05/25/20 0145  vancomycin (VANCOREADY) IVPB 2000 mg/400 mL        2,000 mg 200 mL/hr over 120 Minutes Intravenous STAT 05/25/20 0134 05/25/20 0600       Subjective: She is resting in bed awake and alert still very hoarse voice denies any shortness of breath Objective: Vitals:   05/29/20 0600 05/29/20 0800 05/29/20 0810 05/29/20 1200  BP:   139/74 (!) 141/90  Pulse: 63 67 72 78  Resp: 10 11 15 14   Temp:  98.6 F (37 C)  98.4 F (36.9 C)  TempSrc:  Oral  Oral  SpO2: 93% 100% 100% 94%  Weight:      Height:        Intake/Output Summary (Last 24 hours) at 05/29/2020 1424 Last data filed at 05/29/2020 1012 Gross per 24 hour  Intake 224.63 ml  Output 1000 ml  Net -775.37 ml   Filed Weights   05/26/20 0500 05/27/20 0500 05/28/20 0500  Weight: 128.1 kg 129.9 kg 131.7 kg    Examination:  General exam: Appears calm and comfortable  Respiratory system: Clear to auscultation. Respiratory effort normal. Cardiovascular system: S1 & S2 heard, RRR. No JVD, murmurs, rubs, gallops or clicks. No pedal edema. Gastrointestinal system: Abdomen is nondistended, soft and nontender. No organomegaly or masses felt.  Normal bowel sounds heard. Central nervous system: Alert and oriented. No focal neurological deficits. Extremities: Symmetric 5 x 5 power. Skin: No rashes, lesions or ulcers Psychiatry: Judgement and insight appear normal. Mood & affect appropriate.     Data Reviewed: I have personally reviewed following labs and imaging studies  CBC: Recent Labs  Lab 05/24/20 2045 05/25/20 0335 05/26/20 0746 05/27/20 0322 05/29/20 0258  WBC 12.0* 11.7* 14.3* 11.5* 10.6*  NEUTROABS 7.7  --  13.1*  --   --   HGB 12.9 11.7* 10.9* 10.1* 10.8*  HCT 39.6 37.6 34.2* 32.9* 33.9*  MCV 87.2 91.3 89.1 91.4 88.3  PLT 407* 326 313 270 163   Basic Metabolic Panel: Recent Labs  Lab 05/24/20 2045 05/25/20 0335 05/25/20 0335 05/26/20 0746 05/26/20 1614 05/27/20 0322 05/27/20 1642 05/28/20 0309 05/28/20 1016  NA 140 136  --  138  --  139  --   --  139  K 3.6 3.6  --  3.9  --  4.1  --   --  4.0  CL 106 103  --  103  --  103  --   --  104  CO2 23 22  --  24  --  25  --   --  27  GLUCOSE 126* 205*  --  172*  --  186*  --   --  156*  BUN 11 14  --  23*  --  30*  --   --  27*  CREATININE 0.67 0.72  --  0.84  --  0.76  --   --  0.55  CALCIUM 9.1 8.5*  --  9.0  --  9.0  --   --  8.6*  MG  --  1.9   < > 2.1 2.2 2.3 2.4 2.5*  --   PHOS  --  3.0  --   --  3.2 3.5 3.2 2.7  --    < > = values in this interval not displayed.   GFR: Estimated Creatinine Clearance: 110.4 mL/min (by C-G formula based on SCr of 0.55 mg/dL). Liver Function Tests: Recent Labs  Lab 05/24/20 2045 05/26/20 0746  AST 22 20  ALT 31 29  ALKPHOS 101 73  BILITOT 0.3 0.3  PROT 7.3 6.2*  ALBUMIN 4.0 3.1*   No results for input(s): LIPASE, AMYLASE in the last 168 hours. No results for input(s): AMMONIA in the last 168 hours. Coagulation Profile: No results for input(s): INR, PROTIME in the last 168 hours. Cardiac Enzymes: No results for input(s): CKTOTAL, CKMB, CKMBINDEX, TROPONINI in the last 168 hours. BNP (last 3  results) No results for input(s): PROBNP in the last 8760 hours. HbA1C: No results for input(s): HGBA1C in the last 72 hours. CBG: Recent  Labs  Lab 05/28/20 0749 05/28/20 1142 05/28/20 1546 05/28/20 2004 05/28/20 2333  GLUCAP 148* 138* 161* 139* 144*   Lipid Profile: No results for input(s): CHOL, HDL, LDLCALC, TRIG, CHOLHDL, LDLDIRECT in the last 72 hours. Thyroid Function Tests: No results for input(s): TSH, T4TOTAL, FREET4, T3FREE, THYROIDAB in the last 72 hours. Anemia Panel: No results for input(s): VITAMINB12, FOLATE, FERRITIN, TIBC, IRON, RETICCTPCT in the last 72 hours. Sepsis Labs: Recent Labs  Lab 05/25/20 0335  PROCALCITON <0.10  LATICACIDVEN 2.0*    Recent Results (from the past 240 hour(s))  SARS Coronavirus 2 by RT PCR (hospital order, performed in [facility] hospital lab) Nasopharyngeal Nasopharyngeal Swab     Status: None   Collection Time: 05/24/20  8:14 PM   Specimen: Nasopharyngeal Swab  Result Value Ref Range Status   SARS Coronavirus 2 NEGATIVE NEGATIVE Final    Comment: (NOTE) SARS-CoV-2 target nucleic acids are NOT DETECTED.  The SARS-CoV-2 RNA is generally detectable in upper and lower respiratory specimens during the acute phase of infection. The lowest concentration of SARS-CoV-2 viral copies this assay can detect is 250 copies / mL. A negative result does not preclude SARS-CoV-2 infection and should not be used as the sole basis for treatment or other patient management decisions.  A negative result may occur with improper specimen collection / handling, submission of specimen other than nasopharyngeal swab, presence of viral mutation(s) within the areas targeted by this assay, and inadequate number of viral copies (<250 copies / mL). A negative result must be combined with clinical observations, patient history, and epidemiological information.  Fact Sheet for Patients:   StrictlyIdeas.no  Fact Sheet for  Healthcare Providers: BankingDealers.co.za  This test is not yet approved or  cleared by the [place] FDA and has been authorized for detection and/or diagnosis of SARS-CoV-2 by FDA under an Emergency Use Authorization (EUA).  This EUA will remain in effect (meaning this test can be used) for the duration of the COVID-19 declaration under Section 564(b)(1) of the Act, 21 U.S.C. section 360bbb-3(b)(1), unless the authorization is terminated or revoked sooner.  Performed at [facility], [place] [address] [place], [place] [address]   MRSA PCR Screening     Status: Abnormal   Collection Time: 05/25/20  3:06 AM   Specimen: Nasopharyngeal  Result Value Ref Range Status   MRSA by PCR POSITIVE (A) NEGATIVE Final    Comment:        The GeneXpert MRSA Assay (FDA approved for NASAL specimens only), is one component of a comprehensive MRSA colonization surveillance program. It is not intended to diagnose MRSA infection nor to guide or monitor treatment for MRSA infections. RESULT CALLED TO, READ BACK BY AND VERIFIED WITH: RN B MAY AT [id number] 05/25/20 CRUICKSHANK A Performed at [facility], [place] [address], [place], [place] [address]   Respiratory Panel by PCR     Status: None   Collection Time: 05/25/20  4:30 AM   Specimen: Nasopharyngeal Swab; Respiratory  Result Value Ref Range Status   Adenovirus NOT DETECTED NOT DETECTED Final   Coronavirus 229E NOT DETECTED NOT DETECTED Final    Comment: (NOTE) The Coronavirus on the Respiratory Panel, DOES NOT test for the novel  Coronavirus (2019 nCoV)    Coronavirus HKU1 NOT DETECTED NOT DETECTED Final   Coronavirus NL63 NOT DETECTED NOT DETECTED Final   Coronavirus OC43 NOT DETECTED NOT DETECTED Final   Metapneumovirus NOT DETECTED NOT DETECTED Final   Rhinovirus /  Enterovirus NOT DETECTED NOT DETECTED Final   Influenza A NOT DETECTED NOT DETECTED Final   Influenza B NOT DETECTED  NOT DETECTED Final   Parainfluenza Virus 1 NOT DETECTED NOT DETECTED Final   Parainfluenza Virus 2 NOT DETECTED NOT DETECTED Final   Parainfluenza Virus 3 NOT DETECTED NOT DETECTED Final   Parainfluenza Virus 4 NOT DETECTED NOT DETECTED Final   Respiratory Syncytial Virus NOT DETECTED NOT DETECTED Final   Bordetella pertussis NOT DETECTED NOT DETECTED Final   Chlamydophila pneumoniae NOT DETECTED NOT DETECTED Final   Mycoplasma pneumoniae NOT DETECTED NOT DETECTED Final    Comment: Performed at [facility], [place] [address], [place], [place] [address]  Culture, Urine     Status: None   Collection Time: 05/25/20  4:56 PM   Specimen: Urine, Catheterized  Result Value Ref Range Status   Specimen Description   Final    URINE, CATHETERIZED Performed at [place] [address] [place], [place] [address]    Special Requests   Final    NONE Performed at [facility], [place] [address] [place], [place] [address]    Culture   Final    NO GROWTH Performed at [facility], [place] [address], [place], [place] [address]    Report Status 05/26/2020 FINAL  Final         Radiology Studies: No results found.      Scheduled Meds: . aerochamber Z-Stat Plus/medium  1 each Other Once  . chlorhexidine  15 mL Mouth Rinse BID  . Chlorhexidine Gluconate Cloth  6 each Topical Daily  . dexamethasone (DECADRON) injection  4 mg Intravenous Q6H  . dicyclomine  20 mg Oral TID AC & HS  . enoxaparin (LOVENOX) injection  40 mg Subcutaneous Q24H  . folic acid  1 mg Oral Daily  . mouth rinse  15 mL Mouth Rinse q12n4p  . mupirocin ointment  1 application Nasal BID  . pantoprazole (PROTONIX) IV  40 mg Intravenous Q12H  . rosuvastatin  10 mg Oral Daily   Continuous Infusions: . cefTRIAXone (ROCEPHIN)  IV Stopped (05/29/20 0211)  . lactated ringers 10 mL/hr at 05/28/20 1055     LOS: 4 days     Georgette Shell, MD 05/29/2020, 2:24 PM ,

## 2020-05-29 NOTE — Progress Notes (Signed)
eLink Physician-Brief Progress Note Patient Name: Kaitlyn Jennings DOB: [birth year] MRN: [id number]   Date of Service  05/29/2020  HPI/Events of Note  Patient extubated today and c/o throat pain.   eICU Interventions  Plan: 1. Chloraseptic spray 1 spray PRN.     Intervention Category Major Interventions: Other:  Ashan Cueva Cornelia Copa 05/29/2020, 12:02 AM

## 2020-05-29 NOTE — Plan of Care (Signed)
  Problem: Health Behavior/Discharge Planning: Goal: Ability to manage health-related needs will improve Outcome: Progressing   Problem: Clinical Measurements: Goal: Ability to maintain clinical measurements within normal limits will improve Outcome: Progressing Goal: Will remain free from infection Outcome: Progressing Goal: Diagnostic test results will improve Outcome: Progressing Goal: Respiratory complications will improve Outcome: Progressing Goal: Cardiovascular complication will be avoided Outcome: Progressing   Problem: Activity: Goal: Risk for activity intolerance will decrease Outcome: Progressing   Problem: Nutrition: Goal: Adequate nutrition will be maintained Outcome: Progressing   Problem: Coping: Goal: Level of anxiety will decrease Outcome: Progressing   Problem: Safety: Goal: Ability to remain free from injury will improve Outcome: Progressing

## 2020-05-29 NOTE — Progress Notes (Signed)
OT Cancellation Note  Patient Details Name: Kaitlyn Jennings MRN: [id number] DOB: Feb 10, 1961   Cancelled Treatment:    Reason Eval/Treat Not Completed: Other (comment)  Pt was working with PT when OT checked on pt earlier in the day- will check on pt next day  Kari Baars, [place] [phone] Office- [phone], Edwena Felty D 05/29/2020, 4:37 PM

## 2020-05-29 NOTE — Progress Notes (Signed)
  Speech Language Pathology Treatment: Dysphagia  Patient Details Name: Kaitlyn Jennings MRN: [id number] DOB: 05-22-1961 Today's Date: 05/29/2020 Time: 4098-1191 SLP Time Calculation (min) (ACUTE ONLY): 10 min  Assessment / Plan / Recommendation Clinical Impression  Today pt seen to determine readiness for dietary advancement.  She is fully alert with increased hoarseness in voice compared to yesterday.  On examination and taking picture with pt's phone of posterior pharynx, she appears with 2 abrasions approximately 2 inches across and 4 inches tall bilaterally - ? if this is from intubation?. She denies any discomfort and reports diffuclty clearing secretions in the middle of the night but improvement today.   No indication of aspiration or dysphagia today and pt's vitals remained stable during session.   Recommend advance diet to regular/thin.  Advised pt monitor her pharynx by observation or photos.  Pt remains agreeable to take medicine with puree as she reports difficulty swallowing a larger pill.  Pt reassures SLP that she will follow up with Dr Melvyn Novas as an OP.     HPI HPI: 59 yo female adm to [facility] with stridor, had one week h/o upper respiratory symptoms including rhinorrhea, HAs, sore throat and generalized malaise.  Pt with subglottic edema - airway opening 4 mm requiring intubation by ENT under anesthesia, intubated 9/16-9/20 am.  Swallow eval ordered.  Pt reports h/o lupus that has resolved since being off Remicade, IBS, asthma, broncithis, anxiety, pancreatitis.  She was noted to have debris in her upper airway on imaging prior to intubation.  Pt admits to h/o GERD and takes a PPI once a day- BID if needed.  She reports MD is aware of this medication regimen * her PCP.  Pt denies dysphagia - does admit to some throat clearing that is chronic. Pt also reports h/o esophageal "surgery" approximately 20 years ago where scarring was noted. She reports her voice becomes very hoarse when she gets  bronchitis and she states she is sick "all of the time".  Imaging shows LLL pna vs ATX.      SLP Plan  Continue with current plan of care       Recommendations  Diet recommendations: Regular;Thin liquid Liquids provided via: Straw;Cup Medication Administration: Whole meds with puree (or with cranberry juice) Supervision: Patient able to self feed Compensations: Slow rate;Small sips/bites Postural Changes and/or Swallow Maneuvers: Seated upright 90 degrees;Upright 30-60 min after meal                Oral Care Recommendations: Oral care BID SLP Visit Diagnosis: Dysphagia, unspecified (R13.10) Plan: Continue with current plan of care       GO                Macario Golds 05/29/2020, 10:36 AM  Kathleen Lime, MS [place] Office [phone]

## 2020-05-30 ENCOUNTER — Ambulatory Visit: Payer: Medicare Other | Admitting: Gastroenterology

## 2020-05-30 MED ORDER — PHENOL 1.4 % MT LIQD: 1.0000 | OROMUCOSAL | 0 refills | Status: DC | PRN

## 2020-05-30 MED ORDER — PREDNISONE 10 MG PO TABS: ORAL_TABLET | ORAL | 0 refills | Status: DC

## 2020-05-30 NOTE — Progress Notes (Signed)
Subjective: Pt resting comfortably in bed. No breathing difficulty. She is still hoarse.  Objective: Vital signs in last 24 hours: Temp:  [98 F (36.7 C)-98.8 F (37.1 C)] 98 F (36.7 C) (09/22 0142) Pulse Rate:  [64-80] 64 (09/22 0142) Resp:  [11-22] 14 (09/22 0142) BP: (139-159)/(74-95) 151/90 (09/22 0142) SpO2:  [93 %-100 %] 93 % (09/22 0142) Weight:  [59.4 kg] 59.4 kg (09/22 0500)  Physical Exam: General appearance:alert, cooperative. Eyes: Pupils are equal, round, reactive to light. Extraocular motion is intact.  Ears: Examination of the ears shows normal auricles and external auditory canals bilaterally.  Nose: Nasal examination shows normal mucosa, septum, turbinates.  Face: Facial examination shows no asymmetry. Palpation of the face elicit no significant tenderness.  Mouth: Oral cavity examination shows no mucosalinjury. No significant trismus is noted. Neck: Palpation of the neck reveals no lymphadenopathy or mass. The trachea is midline. The thyroid is not significantly enlarged.No stridor.   Recent Labs    05/29/20 0258  WBC 10.6*  HGB 10.8*  HCT 33.9*  PLT 286   Recent Labs    05/28/20 1016  NA 139  K 4.0  CL 104  CO2 27  GLUCOSE 156*  BUN 27*  CREATININE 0.55  CALCIUM 8.6*    Medications:  I have reviewed the patient's current medications. Scheduled: . aerochamber Z-Stat Plus/medium  1 each Other Once  . buPROPion  150 mg Oral Daily  . chlorhexidine  15 mL Mouth Rinse BID  . Chlorhexidine Gluconate Cloth  6 each Topical Daily  . dexamethasone (DECADRON) injection  4 mg Intravenous Q12H  . dicyclomine  20 mg Oral TID AC & HS  . enoxaparin (LOVENOX) injection  40 mg Subcutaneous Q24H  . folic acid  1 mg Oral Daily  . mouth rinse  15 mL Mouth Rinse q12n4p  . mesalamine  4.8 g Oral Q breakfast  . pantoprazole (PROTONIX) IV  40 mg Intravenous Q12H  . rosuvastatin  10 mg Oral Daily  . sucralfate  1 g Oral BID AC   Continuous: . cefTRIAXone  (ROCEPHIN)  IV 1 g (05/30/20 0524)  . lactated ringers 10 mL/hr at 05/28/20 1055    Assessment/Plan: Pt admitted for stridor and respiratory distress secondary to subglottic edema. - Extubated 2 days ago. Doing well. Now on room air. -No significant stridor. -Her voice is hoarse. - May d/c home on prednisone dose pak. - Patient may follow up with me as an outpatient after discharge.    LOS: 5 days   Kaitlyn Jennings W [place] Heal 05/30/2020, 7:46 AM

## 2020-05-30 NOTE — Care Management Important Message (Signed)
Important Message  Patient Details IM Letter given to the Patient Name: Kaitlyn Jennings MRN: [id number] Date of Birth: 03-05-1961   Medicare Important Message Given:  Yes     Kerin Salen 05/30/2020, 11:18 AM

## 2020-05-30 NOTE — Plan of Care (Signed)
  Problem: Health Behavior/Discharge Planning: Goal: Ability to manage health-related needs will improve Outcome: Completed/Met   Problem: Clinical Measurements: Goal: Ability to maintain clinical measurements within normal limits will improve Outcome: Completed/Met Goal: Will remain free from infection Outcome: Completed/Met Goal: Diagnostic test results will improve Outcome: Completed/Met Goal: Respiratory complications will improve Outcome: Completed/Met Goal: Cardiovascular complication will be avoided Outcome: Completed/Met   Problem: Activity: Goal: Risk for activity intolerance will decrease Outcome: Completed/Met   Problem: Nutrition: Goal: Adequate nutrition will be maintained Outcome: Completed/Met   Problem: Coping: Goal: Level of anxiety will decrease Outcome: Completed/Met   Problem: Safety: Goal: Ability to remain free from injury will improve Outcome: Completed/Met

## 2020-05-30 NOTE — TOC Transition Note (Signed)
Transition of Care [facility]) - CM/SW Discharge Note   Patient Details  Name: Kaitlyn Jennings MRN: [id number] Date of Birth: 1961-03-10  Transition of Care [facility] CM/SW Contact:  Dessa Phi, RN Phone Number: 05/30/2020, 10:38 AM   Clinical Narrative: [facility] accepted for HHPT. No further CM needs.      Final next level of care: Home w Home Health Services Barriers to Discharge: No Barriers Identified   Patient Goals and CMS Choice Patient states their goals for this hospitalization and ongoing recovery are:: unable to state on vent and sedation      Discharge Placement                       Discharge Plan and Services   Discharge Planning Services: CM Consult                      HH Arranged: PT [facility] Agency: [facility] Date [place]: 05/30/20 Time [place]: 1038 Representative spoke with at [place]: Amy  Social Determinants of Health (Alpha) Interventions     Readmission Risk Interventions No flowsheet data found.

## 2020-05-30 NOTE — Progress Notes (Signed)
Occupational Therapy Evaluation  Patient with functional deficits listed below impacting safety and independence with self care. Patient min A with bed mobility, supervision for safety for functional transfers+ ambulation and mod A for perianal hygiene. Educate patient in use of AE such as toileting aid for hygiene at home as patient reports difficulty reaching "I've never been this swollen before." Recommend continued acute OT services for ADL training and building activity tolerance in order to facilitate D/C to venue listed below.    05/30/20 1156  OT Visit Information  Last OT Received On 05/30/20  Assistance Needed +1  History of Present Illness 59 yo female admitted with acute respiratory failure 2* subglottic edema. Intubated 9/16. Extubated 9/20. Hx of ulcerative colitis, asthma, lupus  Precautions  Precautions Fall  Restrictions  Weight Bearing Restrictions No  Home Living  Family/patient expects to be discharged to: Private residence  Living Arrangements Spouse/significant other  Available Help at Discharge Family  Type of [place] to enter  Entrance Stairs-Number of Steps 3  Page One level  Bathroom Shower/Tub Tub/shower unit  Tax adviser - 2 wheels  Prior Function  Level of Independence Independent  Communication  Communication No difficulties;Other (comment) (slightly hoarse voice)  Pain Assessment  Pain Assessment No/denies pain  Cognition  Arousal/Alertness Awake/alert  Behavior During Therapy WFL for tasks assessed/performed  Overall Cognitive Status Within Functional Limits for tasks assessed  Upper Extremity Assessment  Upper Extremity Assessment Overall WFL for tasks assessed  Lower Extremity Assessment  Lower Extremity Assessment Defer to PT evaluation  ADL  Overall ADL's  Needs assistance/impaired  Grooming Wash/dry hands;Supervision/safety;Standing;Oral  care  Upper Body Bathing Set up;Sitting  Lower Body Bathing Supervison/ safety;Sit to/from stand  Lower Body Bathing Details (indicate cue type and reason) to wash upper and lower legs   Upper Body Dressing  Set up;Sitting  Lower Body Dressing Supervision/safety;Sit to/from Arboriculturist;Ambulation;BSC  Toilet Transfer Details (indicate cue type and reason) for safety as patient is mildly unsteady  Toileting- Clothing Manipulation and Hygiene Moderate assistance;Sit to/from stand  Toileting - Clothing Manipulation Details (indicate cue type and reason) patient require assist hygiene after attempted bowel movement, reports harder to reach due to being swollen. Educate patient in compensatory strategies and AE for home to assist with hygiene.  Functional mobility during ADLs Supervision/safety  Bed Mobility  Overal bed mobility Needs Assistance  Bed Mobility Supine to Sit  Supine to sit [facility] elevated;Min assist  General bed mobility comments to mobilize R LE  Transfers  Overall transfer level Needs assistance  Equipment used None  Transfers Sit to/from Stand  Sit to Stand Supervision  General transfer comment supervision for safety as patient is mildly unsteady, reports has not been out of bed much  Balance  Overall balance assessment Needs assistance  Sitting-balance support Feet supported  Sitting balance-Leahy Scale Good  Standing balance support No upper extremity supported  Standing balance-Leahy Scale Fair  Standing balance comment wash hands at sink  OT - End of Session  Activity Tolerance Patient tolerated treatment well  Patient left in chair;with call bell/phone within reach  Nurse Communication Mobility status  OT Assessment  OT Recommendation/Assessment Patient needs continued OT Services  OT Visit Diagnosis Other abnormalities of gait and mobility (R26.89)  OT Problem List Decreased activity tolerance;Obesity;Decreased knowledge of use of DME  or AE;Impaired balance (sitting and/or standing)  OT Plan  OT Frequency (ACUTE  ONLY) Min 2X/week  OT Treatment/Interventions (ACUTE ONLY) Self-care/ADL training;Therapeutic exercise;DME and/or AE instruction;Therapeutic activities;Balance training;Patient/family education  AM-PAC OT "6 Clicks" Daily Activity Outcome Measure (Version 2)  Help from another person eating meals? 4  Help from another person taking care of personal grooming? 3  Help from another person toileting, which includes using toliet, bedpan, or urinal? 3  Help from another person bathing (including washing, rinsing, drying)? 3  Help from another person to put on and taking off regular upper body clothing? 3  Help from another person to put on and taking off regular lower body clothing? 3  6 Click Score 19  OT Recommendation  Follow Up Recommendations No OT follow up  OT Equipment Other (comment) (toileting aide)  Individuals Consulted  Consulted and Agree with Results and Recommendations Patient  Acute Rehab OT Goals  Patient Stated Goal go home today  OT Goal Formulation With patient  Time For Goal Achievement 06/13/20  Potential to Achieve Goals Good  OT Time Calculation  OT Start Time (ACUTE ONLY) 1010  OT Stop Time (ACUTE ONLY) 1043  OT Time Calculation (min) 33 min  OT General Charges  $OT Visit 1 Visit  OT Evaluation  $OT Eval Low Complexity 1 Low  OT Treatments  $Self Care/Home Management  8-22 mins  Written Expression  Dominant Hand Right   Delbert Phenix OT OT pager: [phone]

## 2020-05-30 NOTE — Discharge Summary (Signed)
Physician Discharge Summary  Kaitlyn Jennings EYC:[id number] DOB: 08-15-1961 DOA: 05/24/2020  PCP: Manfred Shirts, PA  Admit date: 05/24/2020 Discharge date: 05/30/2020  Admitted From: Home Disposition: Home Recommendations for Outpatient Follow-up:  1. Follow up with PCP in 1-2 weeks 2. Please obtain BMP/CBC in one week Please follow up with ENT  Home Health: Yes Equipment/Devices none  Discharge Condition stable CODE STATUS: Full code Diet recommendation: Cardiac Brief/Interim Summary:Patient admitted to PCCM on 05/24/2020 with acute respiratory failure secondary to subglottic edema.  Patient was emergently intubated on admission. She is a 59 year old female with history of lupus IBS ulcerative colitis history of asthma admitted with subglottic edema with acute hypoxic respiratory failure likely secondary to viral etiology.  She was intubated treated with antibiotics and steroids and extubated 05/28/2020.  She was seen in consultation by ENT. CT neck narrowing of the glottic and subglottic laryngeal airway with minimum diameter 4 mm. MRSA positive respiratory virus panel Covid negative   Discharge Diagnoses:  Active Problems:   Acute respiratory failure (HCC)   Stridor   Anticipated difficulty with intubation   Encounter for orogastric (OG) tube placement     #1 acute hypoxic respiratory failure due to subglottic edema thought to be secondary to viral etiology/tracheitis versus tracheal stenosis treated with intubation steroids and IV antibiotics. Respiratory virus panel was negative Covid was negative MRSA was positive. Patient seen by ENT We will discharge her on prednisone taper and she will follow up with ENT.  #2 history of IBS/ulcerative colitis continue home meds  #3 history of hyperlipidemia continue statins  #4 depression restart home meds     Nutrition Problem: Inadequate oral intake Etiology: inability to eat    Signs/Symptoms: NPO  status     Interventions: Refer to RD note for recommendations  Estimated body mass index is 19.35 kg/m as calculated from the following:   Height as of this encounter: 5' 9"  (1.753 m).   Weight as of this encounter: 59.4 kg.  Discharge Instructions  Discharge Instructions    Diet - low sodium heart healthy   Complete by: As directed    Increase activity slowly   Complete by: As directed      Allergies as of 05/30/2020      Reactions   Amoxicillin Other (See Comments)   Abdominal pain Abdominal pain   Molds & Smuts    Oseltamivir    N/V stomach cramps    Remicade [infliximab] Other (See Comments)   REACTION: lupus, joint pain      Medication List    STOP taking these medications   azithromycin 250 MG tablet Commonly known as: ZITHROMAX     TAKE these medications   albuterol (2.5 MG/3ML) 0.083% nebulizer solution Commonly known as: PROVENTIL Take 2.5 mg by nebulization as needed.   buPROPion 150 MG 24 hr tablet Commonly known as: WELLBUTRIN XL Take 1 tablet by mouth daily.   CALCIUM 500 + D PO Take 1 capsule by mouth daily.   CULTURELLE PO Take 1 capsule by mouth daily.   cyanocobalamin 1000 MCG/ML injection Commonly known as: (VITAMIN B-12) inject 1 ml every month Dispense with syringes and needles   dicyclomine 20 MG tablet Commonly known as: BENTYL TAKE 1 TABLET BY MOUTH 4 TIMES DAILY WITH MEALS AND  AT BEDTIME   esomeprazole 40 MG capsule Commonly known as: NEXIUM TAKE 1 CAPSULE BY MOUTH TWICE DAILY BEFORE A MEAL What changed:   how much to take  how to take this  when to take this   ferrous sulfate 325 (65 FE) MG tablet Take 325 mg by mouth daily with breakfast.   fluticasone 50 MCG/ACT nasal spray Commonly known as: FLONASE Place 2 sprays into both nostrils daily.   folic acid 1 MG tablet Commonly known as: FOLVITE Take 1 tablet by mouth once daily   mercaptopurine 50 MG tablet Commonly known as: PURINETHOL TAKE 1 TABLET BY  MOUTH DAILY ON AN EMPTY STOMACH ONE  HOUR  BEFORE  OR  2  HOURS  AFTER  MEALS   mesalamine 1.2 g EC tablet Commonly known as: Lialda Take 4 tablets (4.8 g total) by mouth daily with breakfast.   mometasone-formoterol 100-5 MCG/ACT Aero Commonly known as: DULERA Take 2 puffs first thing in am and then another 2 puffs about 12 hours later.   mupirocin ointment 2 % Commonly known as: BACTROBAN Apply topically as directed.   Needles & Syringes Misc Use to inject B12 weekly x 4 then every month   ondansetron 4 MG tablet Commonly known as: ZOFRAN Take 4 mg by mouth every 8 (eight) hours as needed for nausea.   phenol 1.4 % Liqd Commonly known as: CHLORASEPTIC Use as directed 1 spray in the mouth or throat as needed for throat irritation / pain.   polyethylene glycol powder 17 GM/SCOOP powder Commonly known as: GLYCOLAX/MIRALAX Reported on 10/01/2015   predniSONE 10 MG tablet Commonly known as: DELTASONE Take 4 tablets daily for 4 days Then 3 tabs daily for 4 days  Then 2 tabs daily for next 4 days  Then 1 tab daily till done   promethazine-dextromethorphan 6.25-15 MG/5ML syrup Commonly known as: PROMETHAZINE-DM Take 5 mLs by mouth 4 (four) times daily as needed.   rosuvastatin 10 MG tablet Commonly known as: CRESTOR Take 10 mg by mouth daily.   sucralfate 1 g tablet Commonly known as: CARAFATE Take 1 tablet by mouth twice daily   traMADol 50 MG tablet Commonly known as: ULTRAM Take 1 tablet (50 mg total) by mouth 2 (two) times daily as needed.   venlafaxine XR 150 MG 24 hr capsule Commonly known as: EFFEXOR-XR Take 150 mg by mouth daily with breakfast.       Follow-up Information    Manfred Shirts, PA Follow up.   Specialty: General Practice Contact information: [place] [address] [phone]        Leta Baptist, MD Follow up.   Specialty: Otolaryngology Contact information: [address] [place] [address] [phone]               Allergies  Allergen Reactions  . Amoxicillin Other (See Comments)    Abdominal pain Abdominal pain   . Molds & Smuts   . Oseltamivir     N/V stomach cramps   . Remicade [Infliximab] Other (See Comments)    REACTION: lupus, joint pain    Consultations:  ENT and PCCM   Procedures/Studies: DG Chest 2 View  Result Date: 05/24/2020 CLINICAL DATA:  Dyspnea EXAM: CHEST - 2 VIEW COMPARISON:  04/05/2018 FINDINGS: The heart size and mediastinal contours are within normal limits. Both lungs are clear. The visualized skeletal structures are unremarkable. IMPRESSION: No active cardiopulmonary disease. Electronically Signed   By: Fidela Salisbury MD   On: 05/24/2020 17:59   DG Abd 1 View  Result Date: 05/26/2020 CLINICAL DATA:  Enteric catheter placement EXAM: ABDOMEN - 1 VIEW COMPARISON:  05/25/2020 FINDINGS: Frontal view of the lower chest  and upper abdomen demonstrates enteric catheter tip and side port projecting over the gastric antrum. Distended gas-filled loops of bowel are seen within the upper abdomen. Patchy consolidation at the left lung base is unchanged. IMPRESSION: 1. Enteric catheter overlying gastric antrum. Electronically Signed   By: Randa Ngo M.D.   On: 05/26/2020 21:47   DG Abd 1 View  Result Date: 05/25/2020 CLINICAL DATA:  Orogastric tube positioning EXAM: ABDOMEN - 1 VIEW COMPARISON:  None. FINDINGS: Orogastric tube with tip at the distal stomach. No dilated bowel where the abdomen is covered. Left lower lobe airspace disease with air bronchograms, reference chest CT IMPRESSION: Enteric tube with tip at the distal stomach Electronically Signed   By: Monte Fantasia M.D.   On: 05/25/2020 04:02   CT Soft Tissue Neck W Contrast  Result Date: 05/24/2020 CLINICAL DATA:  Stridor EXAM: CT NECK WITH CONTRAST TECHNIQUE: Multidetector CT imaging of the neck was performed using the standard protocol following the bolus administration of intravenous contrast. CONTRAST:   67m OMNIPAQUE IOHEXOL 300 MG/ML  SOLN COMPARISON:  None. FINDINGS: PHARYNX AND LARYNX: There is marked narrowing of the glottic and infraglottic laryngeal airway with minimum diameter of 4 mm. The pharynx is normal. Normal epiglottis. No retropharyngeal abnormality. SALIVARY GLANDS: Normal parotid, submandibular and sublingual glands. THYROID: Normal. LYMPH NODES: No enlarged or abnormal density lymph nodes. VASCULAR: Major cervical vessels are patent. LIMITED INTRACRANIAL: Normal. VISUALIZED ORBITS: Normal. MASTOIDS AND VISUALIZED PARANASAL SINUSES: No fluid levels or advanced mucosal thickening. No mastoid effusion. SKELETON: No bony spinal canal stenosis. No lytic or blastic lesions. UPPER CHEST: There is debris in the upper trachea. Lung apices are clear. OTHER: None. IMPRESSION: 1. Marked narrowing of the glottic and infraglottic laryngeal airway with minimum diameter of 4 mm. 2. Debris in the upper trachea. Electronically Signed   By: KUlyses JarredM.D.   On: 05/24/2020 21:58   DG Chest Port 1 View  Result Date: 05/26/2020 CLINICAL DATA:  Acute respiratory failure. EXAM: PORTABLE CHEST 1 VIEW COMPARISON:  05/25/2020 FINDINGS: ET tube tip is above the carina. Nasogastric tube tip is below the GE junction. Normal scratch set stable cardiomediastinal contours. Low lung volumes. Increased opacification of the retrocardiac left lower lobe. IMPRESSION: 1. Low lung volumes with increased opacification of the retrocardiac left lower lobe which may represent pneumonia or atelectasis. 2. Stable support apparatus. Electronically Signed   By: TKerby MoorsM.D.   On: 05/26/2020 04:39   DG Chest Portable 1 View  Result Date: 05/25/2020 CLINICAL DATA:  Intubated, stridor EXAM: PORTABLE CHEST 1 VIEW COMPARISON:  05/24/2020 FINDINGS: Single frontal view of the chest demonstrates endotracheal tube overlying tracheal air column, tip at level of thoracic inlet. The carina is poorly visualized. Cardiac silhouette is  unremarkable. Lung volumes are diminished with crowding of the central vasculature. No airspace disease, effusion, or pneumothorax. No acute bony abnormalities. IMPRESSION: 1. Endotracheal tube at level of thoracic inlet. 2. Low lung volumes. Electronically Signed   By: MRanda NgoM.D.   On: 05/25/2020 00:07    (Echo, Carotid, EGD, Colonoscopy, ERCP)    Subjective: She is resting in bed awake alert voice still continues to be hoarse but she has no difficulty speaking or swallowing.  Breathing seems to be at baseline.  She is anxious to go home  Discharge Exam: Vitals:   05/29/20 2238 05/30/20 0142  BP: (!) 159/95 (!) 151/90  Pulse: 68 64  Resp: 16 14  Temp: 98.1 F (36.7 C) 98 F (  36.7 C)  SpO2: 95% 93%   Vitals:   05/29/20 2200 05/29/20 2238 05/30/20 0142 05/30/20 0500  BP:  (!) 159/95 (!) 151/90   Pulse: 80 68 64   Resp:  16 14   Temp:  98.1 F (36.7 C) 98 F (36.7 C)   TempSrc:  Oral Oral   SpO2: 96% 95% 93%   Weight:    59.4 kg  Height:        General: Pt is alert, awake, not in acute distress Cardiovascular: RRR, S1/S2 +, no rubs, no gallops Respiratory: CTA bilaterally, no wheezing, no rhonchi Abdominal: Soft, NT, ND, bowel sounds + Extremities: no edema, no cyanosis    The results of significant diagnostics from this hospitalization (including imaging, microbiology, ancillary and laboratory) are listed below for reference.     Microbiology: Recent Results (from the past 240 hour(s))  SARS Coronavirus 2 by RT PCR (hospital order, performed in [facility] hospital lab) Nasopharyngeal Nasopharyngeal Swab     Status: None   Collection Time: 05/24/20  8:14 PM   Specimen: Nasopharyngeal Swab  Result Value Ref Range Status   SARS Coronavirus 2 NEGATIVE NEGATIVE Final    Comment: (NOTE) SARS-CoV-2 target nucleic acids are NOT DETECTED.  The SARS-CoV-2 RNA is generally detectable in upper and lower respiratory specimens during the acute phase of infection.  The lowest concentration of SARS-CoV-2 viral copies this assay can detect is 250 copies / mL. A negative result does not preclude SARS-CoV-2 infection and should not be used as the sole basis for treatment or other patient management decisions.  A negative result may occur with improper specimen collection / handling, submission of specimen other than nasopharyngeal swab, presence of viral mutation(s) within the areas targeted by this assay, and inadequate number of viral copies (<250 copies / mL). A negative result must be combined with clinical observations, patient history, and epidemiological information.  Fact Sheet for Patients:   StrictlyIdeas.no  Fact Sheet for Healthcare Providers: BankingDealers.co.za  This test is not yet approved or  cleared by the [place] FDA and has been authorized for detection and/or diagnosis of SARS-CoV-2 by FDA under an Emergency Use Authorization (EUA).  This EUA will remain in effect (meaning this test can be used) for the duration of the COVID-19 declaration under Section 564(b)(1) of the Act, 21 U.S.C. section 360bbb-3(b)(1), unless the authorization is terminated or revoked sooner.  Performed at [facility], [place] [address], [place], [place] [address]   MRSA PCR Screening     Status: Abnormal   Collection Time: 05/25/20  3:06 AM   Specimen: Nasopharyngeal  Result Value Ref Range Status   MRSA by PCR POSITIVE (A) NEGATIVE Final    Comment:        The GeneXpert MRSA Assay (FDA approved for NASAL specimens only), is one component of a comprehensive MRSA colonization surveillance program. It is not intended to diagnose MRSA infection nor to guide or monitor treatment for MRSA infections. RESULT CALLED TO, READ BACK BY AND VERIFIED WITH: RN B MAY AT [phone] 05/25/20 CRUICKSHANK A Performed at [facility], [place] [address] [place], [place] [address]    Respiratory Panel by PCR     Status: None   Collection Time: 05/25/20  4:30 AM   Specimen: Nasopharyngeal Swab; Respiratory  Result Value Ref Range Status   Adenovirus NOT DETECTED NOT DETECTED Final   Coronavirus 229E NOT DETECTED NOT DETECTED Final    Comment: (NOTE) The Coronavirus on  the Respiratory Panel, DOES NOT test for the novel  Coronavirus (2019 nCoV)    Coronavirus HKU1 NOT DETECTED NOT DETECTED Final   Coronavirus NL63 NOT DETECTED NOT DETECTED Final   Coronavirus OC43 NOT DETECTED NOT DETECTED Final   Metapneumovirus NOT DETECTED NOT DETECTED Final   Rhinovirus / Enterovirus NOT DETECTED NOT DETECTED Final   Influenza A NOT DETECTED NOT DETECTED Final   Influenza B NOT DETECTED NOT DETECTED Final   Parainfluenza Virus 1 NOT DETECTED NOT DETECTED Final   Parainfluenza Virus 2 NOT DETECTED NOT DETECTED Final   Parainfluenza Virus 3 NOT DETECTED NOT DETECTED Final   Parainfluenza Virus 4 NOT DETECTED NOT DETECTED Final   Respiratory Syncytial Virus NOT DETECTED NOT DETECTED Final   Bordetella pertussis NOT DETECTED NOT DETECTED Final   Chlamydophila pneumoniae NOT DETECTED NOT DETECTED Final   Mycoplasma pneumoniae NOT DETECTED NOT DETECTED Final    Comment: Performed at [facility], [place] [address], [place], [place] [address]  Culture, Urine     Status: None   Collection Time: 05/25/20  4:56 PM   Specimen: Urine, Catheterized  Result Value Ref Range Status   Specimen Description   Final    URINE, CATHETERIZED Performed at [place] [address] [place], [place] [address]    Special Requests   Final    NONE Performed at [facility], [place] [address] [place], [place] [address]    Culture   Final    NO GROWTH Performed at [facility], [place] [address], [place], [place] [address]    Report Status 05/26/2020 FINAL  Final     Labs: BNP (last 3 results) No results for input(s): BNP in the last 8760  hours. Basic Metabolic Panel: Recent Labs  Lab 05/24/20 2045 05/25/20 0335 05/25/20 0335 05/26/20 0746 05/26/20 1614 05/27/20 0322 05/27/20 1642 05/28/20 0309 05/28/20 1016  NA 140 136  --  138  --  139  --   --  139  K 3.6 3.6  --  3.9  --  4.1  --   --  4.0  CL 106 103  --  103  --  103  --   --  104  CO2 23 22  --  24  --  25  --   --  27  GLUCOSE 126* 205*  --  172*  --  186*  --   --  156*  BUN 11 14  --  23*  --  30*  --   --  27*  CREATININE 0.67 0.72  --  0.84  --  0.76  --   --  0.55  CALCIUM 9.1 8.5*  --  9.0  --  9.0  --   --  8.6*  MG  --  1.9   < > 2.1 2.2 2.3 2.4 2.5*  --   PHOS  --  3.0  --   --  3.2 3.5 3.2 2.7  --    < > = values in this interval not displayed.   Liver Function Tests: Recent Labs  Lab 05/24/20 2045 05/26/20 0746  AST 22 20  ALT 31 29  ALKPHOS 101 73  BILITOT 0.3 0.3  PROT 7.3 6.2*  ALBUMIN 4.0 3.1*   No results for input(s): LIPASE, AMYLASE in the last 168 hours. No results for input(s): AMMONIA in the last 168 hours. CBC: Recent Labs  Lab 05/24/20 2045 05/25/20 0335 05/26/20 0746 05/27/20  0454 05/29/20 0258  WBC 12.0* 11.7* 14.3* 11.5* 10.6*  NEUTROABS 7.7  --  13.1*  --   --   HGB 12.9 11.7* 10.9* 10.1* 10.8*  HCT 39.6 37.6 34.2* 32.9* 33.9*  MCV 87.2 91.3 89.1 91.4 88.3  PLT 407* 326 313 270 286   Cardiac Enzymes: No results for input(s): CKTOTAL, CKMB, CKMBINDEX, TROPONINI in the last 168 hours. BNP: Invalid input(s): POCBNP CBG: Recent Labs  Lab 05/28/20 0749 05/28/20 1142 05/28/20 1546 05/28/20 2004 05/28/20 2333  GLUCAP 148* 138* 161* 139* 144*   D-Dimer No results for input(s): DDIMER in the last 72 hours. Hgb A1c No results for input(s): HGBA1C in the last 72 hours. Lipid Profile No results for input(s): CHOL, HDL, LDLCALC, TRIG, CHOLHDL, LDLDIRECT in the last 72 hours. Thyroid function studies No results for input(s): TSH, T4TOTAL, T3FREE, THYROIDAB in the last 72 hours.  Invalid input(s):  FREET3 Anemia work up No results for input(s): VITAMINB12, FOLATE, FERRITIN, TIBC, IRON, RETICCTPCT in the last 72 hours. Urinalysis No results found for: COLORURINE, APPEARANCEUR, [place], [place], [place], [place], [place], [place], PROTEINUR, UROBILINOGEN, NITRITE, LEUKOCYTESUR Sepsis Labs Invalid input(s): PROCALCITONIN,  WBC,  LACTICIDVEN Microbiology Recent Results (from the past 240 hour(s))  SARS Coronavirus 2 by RT PCR (hospital order, performed in [facility] hospital lab) Nasopharyngeal Nasopharyngeal Swab     Status: None   Collection Time: 05/24/20  8:14 PM   Specimen: Nasopharyngeal Swab  Result Value Ref Range Status   SARS Coronavirus 2 NEGATIVE NEGATIVE Final    Comment: (NOTE) SARS-CoV-2 target nucleic acids are NOT DETECTED.  The SARS-CoV-2 RNA is generally detectable in upper and lower respiratory specimens during the acute phase of infection. The lowest concentration of SARS-CoV-2 viral copies this assay can detect is 250 copies / mL. A negative result does not preclude SARS-CoV-2 infection and should not be used as the sole basis for treatment or other patient management decisions.  A negative result may occur with improper specimen collection / handling, submission of specimen other than nasopharyngeal swab, presence of viral mutation(s) within the areas targeted by this assay, and inadequate number of viral copies (<250 copies / mL). A negative result must be combined with clinical observations, patient history, and epidemiological information.  Fact Sheet for Patients:   StrictlyIdeas.no  Fact Sheet for Healthcare Providers: BankingDealers.co.za  This test is not yet approved or  cleared by the [place] FDA and has been authorized for detection and/or diagnosis of SARS-CoV-2 by FDA under an Emergency Use Authorization (EUA).  This EUA will remain in effect (meaning this test can be used) for the  duration of the COVID-19 declaration under Section 564(b)(1) of the Act, 21 U.S.C. section 360bbb-3(b)(1), unless the authorization is terminated or revoked sooner.  Performed at [facility], [place] [address], [place], [place] [address]   MRSA PCR Screening     Status: Abnormal   Collection Time: 05/25/20  3:06 AM   Specimen: Nasopharyngeal  Result Value Ref Range Status   MRSA by PCR POSITIVE (A) NEGATIVE Final    Comment:        The GeneXpert MRSA Assay (FDA approved for NASAL specimens only), is one component of a comprehensive MRSA colonization surveillance program. It is not intended to diagnose MRSA infection nor to guide or monitor treatment for MRSA infections. RESULT CALLED TO, READ BACK BY AND VERIFIED WITH: RN B MAY AT [phone] 05/25/20 CRUICKSHANK A Performed at [facility], [place] [address] [place],  [place] [address]   Respiratory Panel by PCR     Status: None   Collection Time: 05/25/20  4:30 AM   Specimen: Nasopharyngeal Swab; Respiratory  Result Value Ref Range Status   Adenovirus NOT DETECTED NOT DETECTED Final   Coronavirus 229E NOT DETECTED NOT DETECTED Final    Comment: (NOTE) The Coronavirus on the Respiratory Panel, DOES NOT test for the novel  Coronavirus (2019 nCoV)    Coronavirus HKU1 NOT DETECTED NOT DETECTED Final   Coronavirus NL63 NOT DETECTED NOT DETECTED Final   Coronavirus OC43 NOT DETECTED NOT DETECTED Final   Metapneumovirus NOT DETECTED NOT DETECTED Final   Rhinovirus / Enterovirus NOT DETECTED NOT DETECTED Final   Influenza A NOT DETECTED NOT DETECTED Final   Influenza B NOT DETECTED NOT DETECTED Final   Parainfluenza Virus 1 NOT DETECTED NOT DETECTED Final   Parainfluenza Virus 2 NOT DETECTED NOT DETECTED Final   Parainfluenza Virus 3 NOT DETECTED NOT DETECTED Final   Parainfluenza Virus 4 NOT DETECTED NOT DETECTED Final   Respiratory Syncytial Virus NOT DETECTED NOT DETECTED Final   Bordetella  pertussis NOT DETECTED NOT DETECTED Final   Chlamydophila pneumoniae NOT DETECTED NOT DETECTED Final   Mycoplasma pneumoniae NOT DETECTED NOT DETECTED Final    Comment: Performed at [facility], [place] [address], [place], [place] [address]  Culture, Urine     Status: None   Collection Time: 05/25/20  4:56 PM   Specimen: Urine, Catheterized  Result Value Ref Range Status   Specimen Description   Final    URINE, CATHETERIZED Performed at [place] [address], [place], [place] [address]    Special Requests   Final    NONE Performed at [facility], [place] [address] [place], [place] [address]    Culture   Final    NO GROWTH Performed at [facility], [place] [address], [place], [place] [address]    Report Status 05/26/2020 FINAL  Final     Time coordinating discharge:  39 minutes  SIGNED:   Georgette Shell, MD  [facility] 05/30/2020, 9:36 AM

## 2020-05-30 NOTE — Progress Notes (Signed)
Pt discharged home today per Dr. Rodena Piety. Pt's IV site D/C'd and WDL. Pt's VSS. Pt provided with home medication list, discharge instructions and prescriptions. Verbalized understanding. Pt left floor via WC in stable condition accompanied by NT.

## 2020-05-30 NOTE — Progress Notes (Signed)
Physical Therapy Treatment Patient Details Name: Kaitlyn Jennings MRN: [id number] DOB: December 21, 1960 Today's Date: 05/30/2020    History of Present Illness 59 yo female admitted with acute respiratory failure 2* subglottic edema. Intubated 9/16. Extubated 9/20. Hx of ulcerative colitis, asthma, lupus    PT Comments    Pt politely declined ambulating stating she had been to bathroom twice this morning and wanted to save energy for d/c home today.  Pt was agreeable to exercises so educated on sitting and standing exercises she can perform once home and then f/u with HHPT.  Pt very pleasant and motivated.   Follow Up Recommendations  Home health PT     Equipment Recommendations  None recommended by PT    Recommendations for Other Services       Precautions / Restrictions Precautions Precautions: Fall Restrictions Weight Bearing Restrictions: No    Mobility  Bed Mobility Overal bed mobility: Needs Assistance Bed Mobility: Supine to Sit     Supine to sit: HOB elevated;Min assist     General bed mobility comments: pt in recliner  Transfers Overall transfer level: Needs assistance Equipment used: None Transfers: Sit to/from Stand Sit to Stand: Supervision         General transfer comment: supervision for safety, a couple steps recliner to sink counter for exercises  Ambulation/Gait             General Gait Details: pt declined to save energy for d/c home today   Stairs             Wheelchair Mobility    Modified Rankin (Stroke Patients Only)       Balance Overall balance assessment: Needs assistance Sitting-balance support: Feet supported Sitting balance-Leahy Scale: Good     Standing balance support: No upper extremity supported Standing balance-Leahy Scale: Fair Standing balance comment: wash hands at sink                            Cognition Arousal/Alertness: Awake/alert Behavior During Therapy: WFL for tasks  assessed/performed Overall Cognitive Status: Within Functional Limits for tasks assessed                                        Exercises General Exercises - Lower Extremity Ankle Circles/Pumps: AROM;Seated;Both;5 reps Long Arc Quad: AROM;Both;Seated;5 reps Hip ABduction/ADduction: AROM;Both;Standing;5 reps Hip Flexion/Marching: AROM;Both;5 reps Heel Raises: AROM;Both;5 reps;Standing Mini-Sqauts: AROM;Both;5 reps;Standing    General Comments        Pertinent Vitals/Pain Pain Assessment: No/denies pain    Home Living Family/patient expects to be discharged to:: Private residence Living Arrangements: Spouse/significant other Available Help at Discharge: Family Type of Home: House Home Access: Stairs to enter Entrance Stairs-Rails: Right;Left Home Layout: One level Home Equipment: Environmental consultant - 2 wheels      Prior Function Level of Independence: Independent          PT Goals (current goals can now be found in the care plan section) Acute Rehab PT Goals Patient Stated Goal: go home today Progress towards PT goals: Progressing toward goals    Frequency    Min 3X/week      PT Plan Current plan remains appropriate    Co-evaluation              AM-PAC PT "6 Clicks" Mobility   Outcome Measure  Help needed turning from your back  to your side while in a flat bed without using bedrails?: A Little Help needed moving from lying on your back to sitting on the side of a flat bed without using bedrails?: A Little Help needed moving to and from a bed to a chair (including a wheelchair)?: A Little Help needed standing up from a chair using your arms (e.g., wheelchair or bedside chair)?: A Little Help needed to walk in hospital room?: A Little Help needed climbing 3-5 steps with a railing? : A Little 6 Click Score: 18    End of Session   Activity Tolerance: Patient tolerated treatment well Patient left: in chair;with call bell/phone within reach   PT  Visit Diagnosis: Muscle weakness (generalized) (M62.81)     Time: 8590-9311 PT Time Calculation (min) (ACUTE ONLY): 9 min  Charges:  $Therapeutic Exercise: 8-22 mins                     Arlyce Dice, DPT Acute Rehabilitation Services Pager: [phone] Office: [phone]  York Ram E 05/30/2020, 2:05 PM

## 2020-05-31 ENCOUNTER — Other Ambulatory Visit: Payer: Self-pay

## 2020-05-31 MED ORDER — TRAMADOL HCL 50 MG PO TABS
50.0000 mg | ORAL_TABLET | Freq: Two times a day (BID) | ORAL | 0 refills | Status: DC | PRN
Start: 2020-05-31 — End: 2020-07-16

## 2020-05-31 MED ORDER — FOLIC ACID 1 MG PO TABS
1.0000 mg | ORAL_TABLET | Freq: Every day | ORAL | 0 refills | Status: DC
Start: 2020-05-31 — End: 2020-09-05

## 2020-07-16 ENCOUNTER — Other Ambulatory Visit: Payer: Self-pay | Admitting: Gastroenterology

## 2020-08-06 ENCOUNTER — Ambulatory Visit: Payer: Medicare Other | Admitting: Gastroenterology

## 2020-08-21 ENCOUNTER — Other Ambulatory Visit: Payer: Self-pay | Admitting: Gastroenterology

## 2020-09-05 ENCOUNTER — Other Ambulatory Visit: Payer: Self-pay | Admitting: Gastroenterology

## 2020-09-06 ENCOUNTER — Other Ambulatory Visit: Payer: Self-pay

## 2020-09-06 MED ORDER — ESOMEPRAZOLE MAGNESIUM 40 MG PO CPDR: DELAYED_RELEASE_CAPSULE | ORAL | 0 refills | Status: DC

## 2020-09-06 NOTE — Telephone Encounter (Signed)
Esomeprazole refilled , patient has office visit on 09/17/2020. I tried to reach her by phone to confirm the dosage. I left her a message that I would refill it at [facility] as originally sent in but for her to take it as she is currently doing.

## 2020-09-17 ENCOUNTER — Ambulatory Visit: Payer: Medicare Other | Admitting: Gastroenterology

## 2020-10-10 ENCOUNTER — Other Ambulatory Visit: Payer: Self-pay | Admitting: Gastroenterology

## 2020-10-18 ENCOUNTER — Ambulatory Visit: Payer: Medicare Other | Admitting: Gastroenterology

## 2020-11-20 ENCOUNTER — Other Ambulatory Visit: Payer: Self-pay | Admitting: Gastroenterology

## 2020-12-12 ENCOUNTER — Other Ambulatory Visit: Payer: Self-pay | Admitting: Gastroenterology

## 2020-12-30 ENCOUNTER — Other Ambulatory Visit: Payer: Self-pay | Admitting: Gastroenterology

## 2021-01-10 ENCOUNTER — Ambulatory Visit: Payer: Medicare Other | Admitting: Gastroenterology

## 2021-01-18 ENCOUNTER — Other Ambulatory Visit: Payer: Self-pay | Admitting: Gastroenterology

## 2021-01-21 ENCOUNTER — Other Ambulatory Visit: Payer: Self-pay | Admitting: Gastroenterology

## 2021-01-31 ENCOUNTER — Other Ambulatory Visit: Payer: Self-pay | Admitting: Gastroenterology

## 2021-02-21 ENCOUNTER — Telehealth: Payer: Self-pay | Admitting: *Deleted

## 2021-02-21 NOTE — Telephone Encounter (Signed)
Working on patients [employer] disability forms

## 2021-02-27 DIAGNOSIS — Z0279 Encounter for issue of other medical certificate: Secondary | ICD-10-CM

## 2021-02-27 NOTE — Telephone Encounter (Signed)
Disability forms filled out and signed by Dr Silverio Decamp, I gave to [facility] to scan in and she will call the patient to collect he $25 fee

## 2021-03-03 ENCOUNTER — Other Ambulatory Visit: Payer: Self-pay | Admitting: Gastroenterology

## 2021-03-13 ENCOUNTER — Other Ambulatory Visit: Payer: Self-pay | Admitting: Gastroenterology

## 2021-03-19 ENCOUNTER — Other Ambulatory Visit: Payer: Self-pay | Admitting: Gastroenterology

## 2021-03-29 ENCOUNTER — Ambulatory Visit: Payer: Medicare Other | Admitting: Gastroenterology

## 2021-04-03 NOTE — Telephone Encounter (Signed)
Patient did not come to hr appointment to pick up forms. Mailed her copies today

## 2021-04-05 ENCOUNTER — Other Ambulatory Visit: Payer: Self-pay | Admitting: Gastroenterology

## 2021-04-18 ENCOUNTER — Other Ambulatory Visit: Payer: Self-pay | Admitting: Gastroenterology

## 2021-04-22 ENCOUNTER — Other Ambulatory Visit: Payer: Self-pay | Admitting: Gastroenterology

## 2021-05-05 ENCOUNTER — Other Ambulatory Visit: Payer: Self-pay | Admitting: Gastroenterology

## 2021-05-26 ENCOUNTER — Other Ambulatory Visit: Payer: Self-pay | Admitting: Gastroenterology

## 2021-06-06 ENCOUNTER — Ambulatory Visit: Payer: Medicare Other | Admitting: Gastroenterology

## 2021-06-07 ENCOUNTER — Other Ambulatory Visit: Payer: Self-pay | Admitting: Gastroenterology

## 2021-06-10 ENCOUNTER — Other Ambulatory Visit: Payer: Self-pay | Admitting: Gastroenterology

## 2021-06-15 ENCOUNTER — Other Ambulatory Visit: Payer: Self-pay | Admitting: Gastroenterology

## 2021-06-29 ENCOUNTER — Other Ambulatory Visit: Payer: Self-pay | Admitting: Gastroenterology

## 2021-07-10 ENCOUNTER — Other Ambulatory Visit: Payer: Self-pay | Admitting: Gastroenterology

## 2021-07-19 ENCOUNTER — Ambulatory Visit: Payer: Medicare Other | Admitting: Gastroenterology

## 2021-08-09 ENCOUNTER — Other Ambulatory Visit: Payer: Self-pay | Admitting: Gastroenterology

## 2021-08-14 ENCOUNTER — Other Ambulatory Visit: Payer: Self-pay | Admitting: Gastroenterology

## 2021-08-18 ENCOUNTER — Other Ambulatory Visit: Payer: Self-pay | Admitting: Gastroenterology

## 2021-09-17 ENCOUNTER — Ambulatory Visit: Payer: Medicare Other | Admitting: Gastroenterology

## 2021-09-17 ENCOUNTER — Other Ambulatory Visit: Payer: Self-pay | Admitting: Gastroenterology

## 2021-10-14 ENCOUNTER — Other Ambulatory Visit: Payer: Self-pay | Admitting: Gastroenterology

## 2021-10-14 NOTE — Telephone Encounter (Signed)
Please advise, thank you.

## 2021-10-20 ENCOUNTER — Other Ambulatory Visit: Payer: Self-pay | Admitting: Gastroenterology

## 2021-10-24 ENCOUNTER — Ambulatory Visit: Payer: Medicare Other | Admitting: Gastroenterology

## 2021-11-19 ENCOUNTER — Other Ambulatory Visit: Payer: Self-pay | Admitting: Gastroenterology

## 2021-11-23 ENCOUNTER — Other Ambulatory Visit: Payer: Self-pay | Admitting: Gastroenterology

## 2021-12-10 ENCOUNTER — Ambulatory Visit: Payer: Medicare Other | Admitting: Gastroenterology

## 2021-12-20 ENCOUNTER — Other Ambulatory Visit: Payer: Self-pay | Admitting: Gastroenterology

## 2021-12-25 ENCOUNTER — Telehealth: Payer: Self-pay | Admitting: Gastroenterology

## 2021-12-25 ENCOUNTER — Other Ambulatory Visit: Payer: Self-pay | Admitting: Gastroenterology

## 2021-12-25 MED ORDER — MESALAMINE 1.2 G PO TBEC: 4.8000 g | DELAYED_RELEASE_TABLET | Freq: Every day | ORAL | 0 refills | Status: DC

## 2021-12-25 MED ORDER — FOLIC ACID 1 MG PO TABS: 1.0000 mg | ORAL_TABLET | Freq: Every day | ORAL | 0 refills | Status: DC

## 2021-12-25 NOTE — Telephone Encounter (Signed)
She has made a follow up appointment for 6/20th  sent meds this morning ?

## 2021-12-25 NOTE — Telephone Encounter (Signed)
Patient called, states she would like a medication refill for Folvite and Lialda. Please advise.  ?

## 2022-01-06 ENCOUNTER — Emergency Department (HOSPITAL_COMMUNITY): Payer: Medicare Other

## 2022-01-06 ENCOUNTER — Inpatient Hospital Stay (HOSPITAL_COMMUNITY)
Admission: EM | Admit: 2022-01-06 | Discharge: 2022-01-08 | DRG: 204 | Disposition: A | Discharge status: Home / Self Care | Payer: Medicare Other | Attending: Internal Medicine | Admitting: Internal Medicine

## 2022-01-06 ENCOUNTER — Encounter (HOSPITAL_COMMUNITY): Payer: Self-pay

## 2022-01-06 DIAGNOSIS — K5190 Ulcerative colitis, unspecified, without complications: Secondary | ICD-10-CM | POA: Diagnosis present

## 2022-01-06 DIAGNOSIS — E538 Deficiency of other specified B group vitamins: Secondary | ICD-10-CM | POA: Diagnosis present

## 2022-01-06 DIAGNOSIS — K219 Gastro-esophageal reflux disease without esophagitis: Secondary | ICD-10-CM | POA: Diagnosis present

## 2022-01-06 DIAGNOSIS — E785 Hyperlipidemia, unspecified: Secondary | ICD-10-CM | POA: Diagnosis present

## 2022-01-06 DIAGNOSIS — R739 Hyperglycemia, unspecified: Secondary | ICD-10-CM | POA: Diagnosis present

## 2022-01-06 DIAGNOSIS — Z7951 Long term (current) use of inhaled steroids: Secondary | ICD-10-CM

## 2022-01-06 DIAGNOSIS — R061 Stridor: Secondary | ICD-10-CM | POA: Diagnosis not present

## 2022-01-06 DIAGNOSIS — E876 Hypokalemia: Secondary | ICD-10-CM | POA: Diagnosis present

## 2022-01-06 DIAGNOSIS — E669 Obesity, unspecified: Secondary | ICD-10-CM | POA: Diagnosis present

## 2022-01-06 DIAGNOSIS — Z6838 Body mass index (BMI) 38.0-38.9, adult: Secondary | ICD-10-CM

## 2022-01-06 DIAGNOSIS — Z833 Family history of diabetes mellitus: Secondary | ICD-10-CM

## 2022-01-06 DIAGNOSIS — R0603 Acute respiratory distress: Secondary | ICD-10-CM | POA: Diagnosis present

## 2022-01-06 DIAGNOSIS — Z79899 Other long term (current) drug therapy: Secondary | ICD-10-CM | POA: Diagnosis not present

## 2022-01-06 DIAGNOSIS — I10 Essential (primary) hypertension: Secondary | ICD-10-CM | POA: Diagnosis present

## 2022-01-06 DIAGNOSIS — J45909 Unspecified asthma, uncomplicated: Secondary | ICD-10-CM | POA: Diagnosis present

## 2022-01-06 DIAGNOSIS — T380X5A Adverse effect of glucocorticoids and synthetic analogues, initial encounter: Secondary | ICD-10-CM | POA: Diagnosis present

## 2022-01-06 DIAGNOSIS — Z825 Family history of asthma and other chronic lower respiratory diseases: Secondary | ICD-10-CM

## 2022-01-06 DIAGNOSIS — R490 Dysphonia: Secondary | ICD-10-CM | POA: Diagnosis present

## 2022-01-06 LAB — CBC WITH DIFFERENTIAL/PLATELET
Abs Immature Granulocytes: 0.07 10*3/uL (ref 0.00–0.07)
Basophils Absolute: 0.1 10*3/uL (ref 0.0–0.1)
Basophils Relative: 1 %
Eosinophils Absolute: 0.4 10*3/uL (ref 0.0–0.5)
Eosinophils Relative: 3 %
HCT: 42.2 % (ref 36.0–46.0)
Hemoglobin: 13.9 g/dL (ref 12.0–15.0)
Immature Granulocytes: 1 %
Lymphocytes Relative: 22 %
Lymphs Abs: 2.6 10*3/uL (ref 0.7–4.0)
MCH: 29.1 pg (ref 26.0–34.0)
MCHC: 32.9 g/dL (ref 30.0–36.0)
MCV: 88.3 fL (ref 80.0–100.0)
Monocytes Absolute: 0.8 10*3/uL (ref 0.1–1.0)
Monocytes Relative: 7 %
Neutro Abs: 8.1 10*3/uL — ABNORMAL HIGH (ref 1.7–7.7)
Neutrophils Relative %: 66 %
Platelets: 432 10*3/uL — ABNORMAL HIGH (ref 150–400)
RBC: 4.78 MIL/uL (ref 3.87–5.11)
RDW: 13.8 % (ref 11.5–15.5)
WBC: 12 10*3/uL — ABNORMAL HIGH (ref 4.0–10.5)
nRBC: 0 % (ref 0.0–0.2)

## 2022-01-06 LAB — BASIC METABOLIC PANEL
Anion gap: 11 (ref 5–15)
BUN: 13 mg/dL (ref 8–23)
CO2: 26 mmol/L (ref 22–32)
Calcium: 9.1 mg/dL (ref 8.9–10.3)
Chloride: 103 mmol/L (ref 98–111)
Creatinine, Ser: 0.62 mg/dL (ref 0.44–1.00)
GFR, Estimated: >60 mL/min (ref >60)
Glucose, Bld: 140 mg/dL — ABNORMAL HIGH (ref 70–99)
Potassium: 3.1 mmol/L — ABNORMAL LOW (ref 3.5–5.1)
Sodium: 140 mmol/L (ref 135–145)

## 2022-01-06 LAB — BLOOD GAS, VENOUS
Acid-Base Excess: 3.7 mmol/L — ABNORMAL HIGH (ref 0.0–2.0)
Bicarbonate: 28.5 mmol/L — ABNORMAL HIGH (ref 20.0–28.0)
O2 Saturation: 78.1 %
Patient temperature: 37
pCO2, Ven: 43 mmHg — ABNORMAL LOW (ref 44–60)
pH, Ven: 7.43 (ref 7.25–7.43)
pO2, Ven: 43 mmHg (ref 32–45)

## 2022-01-06 LAB — LACTIC ACID, PLASMA
Lactic Acid, Venous: 2.6 mmol/L (ref 0.5–1.9)
Lactic Acid, Venous: 3.1 mmol/L (ref 0.5–1.9)

## 2022-01-06 LAB — GLUCOSE, CAPILLARY: Glucose-Capillary: 216 mg/dL — ABNORMAL HIGH (ref 70–99)

## 2022-01-06 MED ORDER — HYDRALAZINE HCL 20 MG/ML IJ SOLN: 10.0000 mg | INTRAMUSCULAR | Status: DC | PRN

## 2022-01-06 MED ORDER — RACEPINEPHRINE HCL 2.25 % IN NEBU
0.5000 mL | INHALATION_SOLUTION | Freq: Once | RESPIRATORY_TRACT | Status: AC
  Administered 2022-01-06: 0.5 mL via RESPIRATORY_TRACT
  Filled 2022-01-06: qty 0.5

## 2022-01-06 MED ORDER — POTASSIUM CHLORIDE CRYS ER 20 MEQ PO TBCR
40.0000 meq | EXTENDED_RELEASE_TABLET | Freq: Once | ORAL | Status: AC
  Administered 2022-01-06: 40 meq via ORAL
  Filled 2022-01-06: qty 2

## 2022-01-06 MED ORDER — DOCUSATE SODIUM 100 MG PO CAPS: 100.0000 mg | ORAL_CAPSULE | Freq: Two times a day (BID) | ORAL | Status: DC | PRN

## 2022-01-06 MED ORDER — ENOXAPARIN SODIUM 40 MG/0.4ML IJ SOSY
40.0000 mg | PREFILLED_SYRINGE | INTRAMUSCULAR | Status: DC
  Administered 2022-01-07 – 2022-01-08 (×2): 40 mg via SUBCUTANEOUS
  Filled 2022-01-06 (×2): qty 0.4

## 2022-01-06 MED ORDER — POLYETHYLENE GLYCOL 3350 17 G PO PACK: 17.0000 g | PACK | Freq: Every day | ORAL | Status: DC | PRN

## 2022-01-06 MED ORDER — LIDOCAINE HCL (PF) 4 % IJ SOLN
5.0000 mL | Freq: Once | INTRAMUSCULAR | Status: AC
  Administered 2022-01-06: 5 mL via RESPIRATORY_TRACT
  Filled 2022-01-06 (×2): qty 5

## 2022-01-06 MED ORDER — IOHEXOL 300 MG/ML  SOLN
80.0000 mL | Freq: Once | INTRAMUSCULAR | Status: AC | PRN
  Administered 2022-01-06: 80 mL via INTRAVENOUS

## 2022-01-06 MED ORDER — PANTOPRAZOLE SODIUM 40 MG IV SOLR
40.0000 mg | Freq: Every day | INTRAVENOUS | Status: DC
  Administered 2022-01-06: 40 mg via INTRAVENOUS
  Filled 2022-01-06: qty 10

## 2022-01-06 MED ORDER — DEXAMETHASONE SODIUM PHOSPHATE 10 MG/ML IJ SOLN
10.0000 mg | Freq: Once | INTRAMUSCULAR | Status: AC
  Administered 2022-01-06: 10 mg via INTRAVENOUS
  Filled 2022-01-06: qty 1

## 2022-01-06 MED ORDER — ALBUTEROL SULFATE (2.5 MG/3ML) 0.083% IN NEBU: 2.5000 mg | INHALATION_SOLUTION | RESPIRATORY_TRACT | Status: DC | PRN

## 2022-01-06 MED ORDER — DEXAMETHASONE SODIUM PHOSPHATE 4 MG/ML IJ SOLN
4.0000 mg | Freq: Four times a day (QID) | INTRAMUSCULAR | Status: AC
  Administered 2022-01-06 – 2022-01-07 (×4): 4 mg via INTRAVENOUS
  Filled 2022-01-06 (×4): qty 1

## 2022-01-06 MED ORDER — LIDOCAINE HCL (PF) 4 % IJ SOLN
5.0000 mL | Freq: Once | INTRAMUSCULAR | Status: AC
  Administered 2022-01-06: 5 mL via RESPIRATORY_TRACT
  Filled 2022-01-06: qty 5

## 2022-01-06 MED ORDER — LIDOCAINE HCL URETHRAL/MUCOSAL 2 % EX GEL
1.0000 "application " | Freq: Once | CUTANEOUS | Status: AC
  Administered 2022-01-06: 1 via TOPICAL

## 2022-01-06 NOTE — H&P (Addendum)
? ?NAME:  Kaitlyn Jennings, MRN:  [id number], DOB:  1961/05/16, LOS: 0 ?ADMISSION DATE:  01/06/2022, CONSULTATION DATE:  5/1 ?REFERRING MD:  Dr. Kathrynn Humble, CHIEF COMPLAINT:  Stridor  ? ?History of Present Illness:  ?61 year old female with PMH as below, which is significant for ulcerative colitis and asthmatic bronchitis.  She was admitted to [facility] in 2021 with an episode of acute stridor secondary to subglottic edema requiring emergent intubation in the emergency department.  The acute cause of edema was not identified, but was speculated to be viral in nature.  She was evaluated by ENT while inpatient but did not follow-up outpatient.  She was in her usual state of health until approximately mid April when she began to experience the feeling of throat tightness and occasional dyspnea.  She presented to her PCP and was prescribed prednisone.  She was on prednisone until approximately 4/30 and was feeling much better.  On 5/1 the edema significantly worsened to the point where she felt unable to move any air and presented to the emergency department as such.  She was urgently administered dexamethasone and racemic epinephrine neb in the emergency department and symptoms improved significantly.  With ongoing concern for airway compromise PCCM was consulted.  Of note, she was started valsartan approximately 3 months prior to the onset of her symptoms. ? ?Pertinent  Medical History  ? has a past medical history of Anxiety, Arthritis, Asthma, B12 deficiency, Chronic gastritis, GERD (gastroesophageal reflux disease), Hyperlipidemia, IBS (irritable bowel syndrome), Lupus ([place]), Pancreatitis, Rectal polyp (10/01/2015), and Ulcerative colitis. ? ? ?Significant Hospital Events: ?Including procedures, antibiotic start and stop dates in addition to other pertinent events   ? ? ?Interim History / Subjective:  ? ? ?Objective   ?Blood pressure (!) 150/92, pulse 94, temperature 97.6 ?F (36.4 ?C), temperature source  Axillary, resp. rate 19, height 5' 9"  (1.753 m), last menstrual period 11/12/2016, SpO2 94 %. ?   ?   ?No intake or output data in the 24 hours ending 01/06/22 1704 ?There were no vitals filed for this visit. ? ?Examination: ?General: overweight middle aged female in NAD ?HENT: [place]/AT, PERRL, no JVD. No obvious edema of visible oral mucosa.  ?Lungs: Clear bilateral breath sounds.  ?Cardiovascular: Tachy, regular, no MRG ?Abdomen: Soft, non-tender, non-distended ?Extremities: No acute deformity or ROM limitation ?Neuro: Alert, oriented, non-focal ? ? ?Resolved Hospital Problem list   ? ? ?Assessment & Plan:  ? ?Stridor: with history of subglottic edema requiring intubation in 2021. No clear etiology, but felt to possibly be viral. Now presenting with same. No viral prodrome to speak of. Responded to steroids and racemic. Of note, she was started on valsartan in January of this year. Fortunately she has responded well to steroids and is looking much better.  ?- Admit to ICU for close monitoring ?- Dexamethasone scheduled ?- PRN racemic epinephrine  ?- CT soft tissue neck pending.  ?- Ceftriaxone IV until epiglottitis ruled out.  ?- Has been seen by Dr. Benjamine Mola in the past if she required ENT consult.  ?- NPO for now ? ?Asthma: followed by PCP ?- PRN albuterol per home regimen ? ?GERD ?- Will give IV PPI ? ?HTN ?- hold valsartan. With history of airway edema, may make sense to not restart ACE-I/ARB ? ?Hypokalemia ?- 40 meq Kdur now repeat in AM ? ?Home medications holding effor and wellbutrin for tonight until swallowing better ? ?Best Practice (right click and "Reselect all SmartList Selections" daily)  ? ?  Diet/type: clear liquids ?DVT prophylaxis: LMWH ?GI prophylaxis: PPI ?Lines: N/A ?Foley:  N/A ?Code Status:  full code ?Last date of multidisciplinary goals of care discussion [ ]  ? ?Labs   ?CBC: ?Recent Labs  ?Lab 01/06/22 ?1520  ?WBC 12.0*  ?NEUTROABS 8.1*  ?HGB 13.9  ?HCT 42.2  ?MCV 88.3  ?PLT 432*  ? ? ?Basic  Metabolic Panel: ?Recent Labs  ?Lab 01/06/22 ?1520  ?NA 140  ?K 3.1*  ?CL 103  ?CO2 26  ?GLUCOSE 140*  ?BUN 13  ?CREATININE 0.62  ?CALCIUM 9.1  ? ?GFR: ?CrCl cannot be calculated (Unknown ideal weight.). ?Recent Labs  ?Lab 01/06/22 ?1520 01/06/22 ?1521  ?WBC 12.0*  --   ?LATICACIDVEN  --  2.6*  ? ? ?Liver Function Tests: ?No results for input(s): AST, ALT, ALKPHOS, BILITOT, PROT, ALBUMIN in the last 168 hours. ?No results for input(s): LIPASE, AMYLASE in the last 168 hours. ?No results for input(s): AMMONIA in the last 168 hours. ? ?ABG ?   ?Component Value Date/Time  ? PHART 7.350 05/25/2020 0146  ? PCO2ART 46.1 05/25/2020 0146  ? PO2ART 93.6 05/25/2020 0146  ? HCO3 28.5 (H) 01/06/2022 1515  ? ACIDBASEDEF 0.5 05/25/2020 0146  ? O2SAT 78.1 01/06/2022 1515  ?  ? ?Coagulation Profile: ?No results for input(s): INR, PROTIME in the last 168 hours. ? ?Cardiac Enzymes: ?No results for input(s): CKTOTAL, CKMB, CKMBINDEX, TROPONINI in the last 168 hours. ? ?HbA1C: ?Hgb A1c MFr Bld  ?Date/Time Value Ref Range Status  ?08/20/2012 08:24 AM 6.2 4.6 - 6.5 % Final  ?  Comment:  ?  Glycemic Control Guidelines for People with Diabetes:Non Diabetic:  <6%Goal of Therapy: <7%Additional Action Suggested:  >8%   ? ? ?CBG: ?No results for input(s): GLUCAP in the last 168 hours. ? ?Review of Systems:   ?Bolds are positive  ?Constitutional: weight loss, gain, night sweats, Fevers, chills, fatigue .  ?HEENT: headaches, Sore throat, sneezing, nasal congestion, post nasal drip, Difficulty swallowing, Tooth/dental problems, visual complaints visual changes, ear ache ?CV:  chest pain, radiates:,Orthopnea, PND, swelling in lower extremities, dizziness, palpitations, syncope.  ?GI  heartburn, indigestion, abdominal pain, nausea, vomiting, diarrhea, change in bowel habits, loss of appetite, bloody stools.  ?Resp: cough, productive: , hemoptysis, dyspnea, chest pain, pleuritic.  ?Skin: rash or itching or icterus ?GU: dysuria, change in color of  urine, urgency or frequency. flank pain, hematuria  ?MS: joint pain or swelling. decreased range of motion  ?Psych: change in mood or affect. depression or anxiety.  ?Neuro: difficulty with speech, weakness, numbness, ataxia  ? ?Past Medical History:  ?She,  has a past medical history of Anxiety, Arthritis, Asthma, B12 deficiency, Chronic gastritis, GERD (gastroesophageal reflux disease), Hyperlipidemia, IBS (irritable bowel syndrome), Lupus ([place]), Pancreatitis, Rectal polyp (10/01/2015), and Ulcerative colitis.  ? ?Surgical History:  ? ?Past Surgical History:  ?Procedure Laterality Date  ? BREAST CYST ASPIRATION    ? left  ? ESOPHAGUS SURGERY    ? SINUS SURGERY WITH INSTATRAK    ?  ? ?Social History:  ? reports that she has never smoked. She has never used smokeless tobacco. She reports that she does not drink alcohol and does not use drugs.  ? ?Family History:  ?Her family history includes COPD in her father; Diabetes in her maternal grandfather; Irritable bowel syndrome in her father. There is no history of Colon cancer or Stomach cancer.  ? ?Allergies ?Allergies  ?Allergen Reactions  ? Amoxicillin Other (See Comments)  ?  Abdominal pain  ?  Infliximab Other (See Comments)  ?  "Lupus," joint pain and swelling ? ? ?  ? Molds & Smuts   ? Oseltamivir Nausea And Vomiting and Other (See Comments)  ?  Also, stomach cramps   ?  ? ?Home Medications  ?Prior to Admission medications   ?Medication Sig Start Date End Date Taking? Authorizing Provider  ?albuterol (PROVENTIL) (2.5 MG/3ML) 0.083% nebulizer solution Take 2.5 mg by nebulization as needed.      [provider]  ?buPROPion (WELLBUTRIN XL) 150 MG 24 hr tablet Take 1 tablet by mouth daily. 12/14/17   [provider]  ?Calcium Carbonate-Vitamin D (CALCIUM 500 + D PO) Take 1 capsule by mouth daily.      [provider]  ?cyanocobalamin (,VITAMIN B-12,) 1000 MCG/ML injection inject 1 ml every month ?Dispense with syringes and needles 01/16/20    Nandigam, Venia Minks, MD  ?dicyclomine (BENTYL) 20 MG tablet TAKE 1 TABLET BY MOUTH 4 TIMES DAILY WITH MEALS AND AT BEDTIME 08/21/20   Nandigam, Venia Minks, MD  ?esomeprazole (NEXIUM) 40 MG capsule TAKE 1 CAPSULE BY MOUT

## 2022-01-06 NOTE — Progress Notes (Signed)
RT gave a lidocaine nebulizer per doctors orders. RT will continue to monitor ?

## 2022-01-06 NOTE — Progress Notes (Signed)
eLink Physician-Brief Progress Note ?Patient Name: Kaitlyn Jennings ?DOB: 09-Dec-1960 ?MRN: [id number] ? ? ?Date of Service ? 01/06/2022  ?HPI/Events of Note ? Patient admitted to the ICU for monitoring secondary to Valsartan induced angioedema.  ?eICU Interventions ? New Patient Evaluation.  ? ? ? ?  ? ?Frederik Pear ?01/06/2022, 10:57 PM ?

## 2022-01-06 NOTE — ED Provider Notes (Signed)
?[facility] DEPT ?Provider Note ? ? ?CSN: [id number] ?Arrival date & time: 01/06/22  1500 ? ?  ? ?History ? ?Chief Complaint  ?Patient presents with  ? Shortness of Breath  ? ? ?Kaitlyn Jennings is a 61 y.o. female. ? ?HPI ? ?  ? ?61 year old female comes in with chief complaint of shortness of breath. ?Patient has history of subglottic edema leading to stridor and requiring intubation in 2021.  She indicates that she has had shortness of breath off and on over the last several years.  More recently, she was having increased shortness of breath that prompted her to go to her PCP.  She was started on steroids which was completed yesterday.  Today she started having worsening shortness of breath.  She is noted to have hoarse voice, and she feels like it is hard for her to get air in her.  Patient started feeling anxious and decided to come to the ER.  She denies any recent cough or illness. ? ? ?Home Medications ?Prior to Admission medications   ?Medication Sig Start Date End Date Taking? Authorizing Provider  ?albuterol (PROVENTIL) (2.5 MG/3ML) 0.083% nebulizer solution Take 2.5 mg by nebulization as needed.      [provider]  ?buPROPion (WELLBUTRIN XL) 150 MG 24 hr tablet Take 1 tablet by mouth daily. 12/14/17   [provider]  ?Calcium Carbonate-Vitamin D (CALCIUM 500 + D PO) Take 1 capsule by mouth daily.      [provider]  ?cyanocobalamin (,VITAMIN B-12,) 1000 MCG/ML injection inject 1 ml every month ?Dispense with syringes and needles 01/16/20   Nandigam, Venia Minks, MD  ?dicyclomine (BENTYL) 20 MG tablet TAKE 1 TABLET BY MOUTH 4 TIMES DAILY WITH MEALS AND AT BEDTIME 08/21/20   Nandigam, Venia Minks, MD  ?esomeprazole (NEXIUM) 40 MG capsule TAKE 1 CAPSULE BY MOUTH TWICE DAILY BEFORE A MEAL 11/19/21   Nandigam, Venia Minks, MD  ?ferrous sulfate 325 (65 FE) MG tablet Take 325 mg by mouth daily with breakfast.      [provider]  ?fluticasone (FLONASE) 50  MCG/ACT nasal spray Place 2 sprays into both nostrils daily.    [provider]  ?folic acid (FOLVITE) 1 MG tablet Take 1 tablet (1 mg total) by mouth daily. 12/25/21   Mauri Pole, MD  ?Lactobacillus Rhamnosus, GG, (CULTURELLE PO) Take 1 capsule by mouth daily.    [provider]  ?mercaptopurine (PURINETHOL) 50 MG tablet TAKE 1 TABLET BY MOUTH ONCE DAILY ON AN EMPTY STOMACH AFTER A MEAL 10/21/21   Mauri Pole, MD  ?mesalamine (LIALDA) 1.2 g EC tablet Take 4 tablets (4.8 g total) by mouth daily with breakfast. 12/25/21   Mauri Pole, MD  ?mometasone-formoterol (DULERA) 100-5 MCG/ACT AERO Take 2 puffs first thing in am and then another 2 puffs about 12 hours later. 04/05/18   Tanda Rockers, MD  ?mupirocin ointment (BACTROBAN) 2 % Apply topically as directed. 12/14/17   [provider]  ?Needles & Syringes MISC Use to inject B12 weekly x 4 then every month 09/26/19   Mauri Pole, MD  ?ondansetron (ZOFRAN) 4 MG tablet Take 4 mg by mouth every 8 (eight) hours as needed for nausea.     [provider]  ?phenol (CHLORASEPTIC) 1.4 % LIQD Use as directed 1 spray in the mouth or throat as needed for throat irritation / pain. 05/30/20   Georgette Shell, MD  ?polyethylene glycol powder [facility]) powder  Reported on 10/01/2015 02/18/11   Lafayette Dragon, MD  ?predniSONE (DELTASONE) 10 MG tablet Take 4 tablets daily for 4 days ?Then 3 tabs daily for 4 days  ?Then 2 tabs daily for next 4 days  ?Then 1 tab daily till done 05/30/20   Georgette Shell, MD  ?promethazine-dextromethorphan (PROMETHAZINE-DM) 6.25-15 MG/5ML syrup Take 5 mLs by mouth 4 (four) times daily as needed. 05/16/20   [provider]  ?rosuvastatin (CRESTOR) 10 MG tablet Take 10 mg by mouth daily.      [provider]  ?sucralfate (CARAFATE) 1 g tablet Take 1 tablet by mouth twice daily 05/06/21   Mauri Pole, MD  ?traMADol (ULTRAM) 50 MG tablet Take 1 tablet by  mouth twice daily as needed 08/19/21   Mauri Pole, MD  ?venlafaxine XR (EFFEXOR-XR) 150 MG 24 hr capsule Take 150 mg by mouth daily with breakfast.    [provider]  ?   ? ?Allergies    ?Amoxicillin, Infliximab, Molds & smuts, and Oseltamivir   ? ?Review of Systems   ?Review of Systems  ?All other systems reviewed and are negative. ? ?Physical Exam ?Updated Vital Signs ?BP (!) 138/92   Pulse (!) 110   Temp 97.6 ?F (36.4 ?C) (Axillary)   Resp (!) 25   Ht 5' 9"  (1.753 m)   LMP 11/12/2016 (Approximate)   SpO2 95%   BMI 19.35 kg/m?  ?Physical Exam ?Vitals and nursing note reviewed.  ?Constitutional:   ?   Appearance: She is well-developed.  ?HENT:  ?   Head: Atraumatic.  ?Cardiovascular:  ?   Rate and Rhythm: Normal rate.  ?Pulmonary:  ?   Effort: Tachypnea present.  ?   Breath sounds: Stridor present. No decreased breath sounds, wheezing, rhonchi or rales.  ?Musculoskeletal:  ?   Cervical back: Normal range of motion and neck supple.  ?Skin: ?   General: Skin is warm and dry.  ?Neurological:  ?   Mental Status: She is alert and oriented to person, place, and time.  ? ? ?ED Results / Procedures / Treatments   ?Labs ?(all labs ordered are listed, but only abnormal results are displayed) ?Labs Reviewed  ?BASIC METABOLIC PANEL - Abnormal; Notable for the following components:  ?    Result Value  ? Potassium 3.1 (*)   ? Glucose, Bld 140 (*)   ? All other components within normal limits  ?CBC WITH DIFFERENTIAL/PLATELET - Abnormal; Notable for the following components:  ? WBC 12.0 (*)   ? Platelets 432 (*)   ? Neutro Abs 8.1 (*)   ? All other components within normal limits  ?BLOOD GAS, VENOUS - Abnormal; Notable for the following components:  ? pCO2, Ven 43 (*)   ? Bicarbonate 28.5 (*)   ? Acid-Base Excess 3.7 (*)   ? All other components within normal limits  ?LACTIC ACID, PLASMA - Abnormal; Notable for the following components:  ? Lactic Acid, Venous 2.6 (*)   ? All other components within normal  limits  ?LACTIC ACID, PLASMA  ? ? ?EKG ?EKG Interpretation ? ?Date/Time:  Monday Jan 06 2022 15:12:00 EDT ?Ventricular Rate:  105 ?PR Interval:  157 ?QRS Duration: 145 ?QT Interval:  352 ?QTC Calculation: 463 ?R Axis:   82 ?Text Interpretation: Sinus tachycardia Paired ventricular premature complexes Nonspecific intraventricular conduction delay No acute changes Confirmed by Varney Biles [id number]) on 01/06/2022 5:38:11 PM ? ?Radiology ?CT Soft Tissue Neck W Contrast ? ?Result Date: 01/06/2022 ?CLINICAL  DATA:  Soft tissue swelling. Shortness of breath and wheezing. EXAM: CT NECK WITH CONTRAST TECHNIQUE: Multidetector CT imaging of the neck was performed using the standard protocol following the bolus administration of intravenous contrast. RADIATION DOSE REDUCTION: This exam was performed according to the departmental dose-optimization program which includes automated exposure control, adjustment of the mA and/or kV according to patient size and/or use of iterative reconstruction technique. CONTRAST:  20m OMNIPAQUE IOHEXOL 300 MG/ML  SOLN COMPARISON:  Neck CT 05/24/2020 FINDINGS: Pharynx and larynx: No evidence of a mass or significant swelling. Patent airway. No fluid collection or inflammatory changes in the parapharyngeal or retropharyngeal spaces. Salivary glands: No inflammation, mass, or stone. Thyroid: Unremarkable. Lymph nodes: No enlarged or suspicious lymph nodes in the neck. Vascular: Major vascular structures of the neck are grossly patent. Limited intracranial: Unremarkable. Visualized orbits: Unremarkable. Mastoids and visualized paranasal sinuses: Clear. Skeleton: No suspicious osseous lesion. Advanced facet arthrosis with grade 1 anterolisthesis at C3-4 and C4-5. Upper chest: Reported separately. Other: None. IMPRESSION: No acute abnormality identified in the neck. Electronically Signed   By: ALogan BoresM.D.   On: 01/06/2022 17:21  ? ?CT Chest W Contrast ? ?Result Date: 01/06/2022 ?CLINICAL DATA:   Shortness of breath and wheezing. EXAM: CT CHEST WITH CONTRAST TECHNIQUE: Multidetector CT imaging of the chest was performed during intravenous contrast administration. RADIATION DOSE REDUCTION: This exam was perf

## 2022-01-06 NOTE — Progress Notes (Signed)
eLink Physician-Brief Progress Note ?Patient Name: Kaitlyn Jennings ?DOB: 1961-01-03 ?MRN: [id number] ? ? ?Date of Service ? 01/06/2022  ?HPI/Events of Note ? Patient needs a PRN iv medication for hypertension.  ?eICU Interventions ? PRN iv Hydralazine ordered.  ? ? ? ?  ? ?Kaitlyn Jennings ?01/06/2022, 11:38 PM ?

## 2022-01-06 NOTE — ED Triage Notes (Signed)
Pt presents with c/o shortness of breath. Pt is barely able to talk but is alert and oriented. Pt had audible wheezing, reports she has been taking prednisone but her breathing seems to be getting worse. ?

## 2022-01-07 DIAGNOSIS — R061 Stridor: Secondary | ICD-10-CM

## 2022-01-07 LAB — CBC
HCT: 38.6 % (ref 36.0–46.0)
Hemoglobin: 12.9 g/dL (ref 12.0–15.0)
MCH: 28.6 pg (ref 26.0–34.0)
MCHC: 33.4 g/dL (ref 30.0–36.0)
MCV: 85.6 fL (ref 80.0–100.0)
Platelets: 377 10*3/uL (ref 150–400)
RBC: 4.51 MIL/uL (ref 3.87–5.11)
RDW: 13.8 % (ref 11.5–15.5)
WBC: 13.2 10*3/uL — ABNORMAL HIGH (ref 4.0–10.5)
nRBC: 0 % (ref 0.0–0.2)

## 2022-01-07 LAB — HEMOGLOBIN A1C
Hgb A1c MFr Bld: 6.3 % — ABNORMAL HIGH (ref 4.8–5.6)
Mean Plasma Glucose: 134.11 mg/dL

## 2022-01-07 LAB — BASIC METABOLIC PANEL
Anion gap: 9 (ref 5–15)
BUN: 9 mg/dL (ref 8–23)
CO2: 22 mmol/L (ref 22–32)
Calcium: 9.5 mg/dL (ref 8.9–10.3)
Chloride: 107 mmol/L (ref 98–111)
Creatinine, Ser: 0.59 mg/dL (ref 0.44–1.00)
GFR, Estimated: >60 mL/min (ref >60)
Glucose, Bld: 171 mg/dL — ABNORMAL HIGH (ref 70–99)
Potassium: 3.8 mmol/L (ref 3.5–5.1)
Sodium: 138 mmol/L (ref 135–145)

## 2022-01-07 LAB — PHOSPHORUS: Phosphorus: 2.3 mg/dL — ABNORMAL LOW (ref 2.5–4.6)

## 2022-01-07 LAB — MRSA NEXT GEN BY PCR, NASAL: MRSA by PCR Next Gen: NOT DETECTED

## 2022-01-07 LAB — MAGNESIUM: Magnesium: 2 mg/dL (ref 1.7–2.4)

## 2022-01-07 LAB — HIV ANTIBODY (ROUTINE TESTING W REFLEX): HIV Screen 4th Generation wRfx: NONREACTIVE

## 2022-01-07 MED ORDER — POTASSIUM PHOSPHATES 15 MMOLE/5ML IV SOLN
15.0000 mmol | Freq: Once | INTRAVENOUS | Status: AC
  Administered 2022-01-07: 15 mmol via INTRAVENOUS
  Filled 2022-01-07: qty 5

## 2022-01-07 MED ORDER — FAMOTIDINE 20 MG PO TABS
20.0000 mg | ORAL_TABLET | Freq: Two times a day (BID) | ORAL | Status: DC
  Administered 2022-01-07 – 2022-01-08 (×3): 20 mg via ORAL
  Filled 2022-01-07 (×3): qty 1

## 2022-01-07 MED ORDER — DIPHENHYDRAMINE HCL 25 MG PO CAPS
25.0000 mg | ORAL_CAPSULE | Freq: Four times a day (QID) | ORAL | Status: DC
  Administered 2022-01-07 – 2022-01-08 (×5): 25 mg via ORAL
  Filled 2022-01-07 (×6): qty 1

## 2022-01-07 MED ORDER — CHLORHEXIDINE GLUCONATE CLOTH 2 % EX PADS
6.0000 | MEDICATED_PAD | Freq: Every day | CUTANEOUS | Status: DC
  Administered 2022-01-07 – 2022-01-08 (×2): 6 via TOPICAL

## 2022-01-07 MED ORDER — BUDESONIDE 0.25 MG/2ML IN SUSP
0.2500 mg | Freq: Two times a day (BID) | RESPIRATORY_TRACT | Status: DC
  Administered 2022-01-07: 0.25 mg via RESPIRATORY_TRACT
  Filled 2022-01-07: qty 2

## 2022-01-07 MED ORDER — ARFORMOTEROL TARTRATE 15 MCG/2ML IN NEBU
15.0000 ug | INHALATION_SOLUTION | Freq: Two times a day (BID) | RESPIRATORY_TRACT | Status: DC
  Administered 2022-01-07: 15 ug via RESPIRATORY_TRACT
  Filled 2022-01-07: qty 2

## 2022-01-07 NOTE — Progress Notes (Signed)
Phos 2.3 ?Replaced per protocol  ?

## 2022-01-07 NOTE — TOC Initial Note (Signed)
Transition of Care (TOC) - Initial/Assessment Note  ? ? ?Patient Details  ?Name: Kaitlyn Jennings ?MRN: [id number] ?Date of Birth: Nov 18, 1960 ? ?Transition of Care (TOC) CM/SW Contact:    ?Paulene Floor Braelin Brosch, LCSWA ?Phone Number: ?01/07/2022, 11:55 AM ? ?Clinical Narrative:                 ?CSW met with the patient at bedside.  The patient was alert and oriented x4.  Patient reports that she is from home with her husband.  The spouse drives her to appointments.  Lanier Prude remains that patients PCP and the patient was ambulatory prior to admission and does not have any DME at home.  The patient reports that she has received home health services after a previous admission, but could not remember the name of the agency.   ? ?Transition of Care Department [facility]) has reviewed patient and no TOC needs have been identified at this time. We will continue to monitor patient advancement through interdisciplinary progression rounds. If new patient transition needs arise, please place a TOC consult. ?  ? ?  ?  ? ? ?Patient Goals and CMS Choice ?  ?  ?  ? ?Expected Discharge Plan and Services ?  ?  ?  ?  ?  ?                ?  ?  ?  ?  ?  ?  ?  ?  ?  ?  ? ?Prior Living Arrangements/Services ?  ?  ?  ?       ?  ?  ?  ?  ? ?Activities of Daily Living ?  ?  ? ?Permission Sought/Granted ?  ?  ?   ?   ?   ?   ? ?Emotional Assessment ?  ?  ?  ?  ?  ?  ? ?Admission diagnosis:  Stridor [R06.1] ?Acute respiratory distress [R06.03] ?Patient Active Problem List  ? Diagnosis Date Noted  ? Anticipated difficulty with intubation   ? Encounter for orogastric (OG) tube placement   ? Acute respiratory failure ([place]) 05/25/2020  ? Stridor   ? Upper airway cough syndrome 04/05/2018  ? B12 DEFICIENCY 03/26/2010  ? GASTRITIS 02/04/2008  ? ABDOMINAL PAIN, EPIGASTRIC 02/04/2008  ? ANXIETY 09/25/2007  ? Ulcerative colitis ([place]) 09/25/2007  ? IRRITABLE BOWEL SYNDROME 09/25/2007  ? ARTHRITIS 09/25/2007  ? PANCREATITIS, ACUTE, HX OF 09/25/2007  ? ?PCP:  Manfred Shirts, PA ?Pharmacy:   ?[place] [facility] [place] ?[address] ?[place] [address] ?Phone: [phone] Fax: [phone] ? ? ? ? ?Social Determinants of Health (SDOH) Interventions ?  ? ?Readmission Risk Interventions ?   ? View : No data to display.  ?  ?  ?  ? ? ? ?

## 2022-01-07 NOTE — Progress Notes (Addendum)
? ?NAME:  Kaitlyn Jennings, MRN:  [id number], DOB:  23-Aug-1961, LOS: 1 ?ADMISSION DATE:  01/06/2022, CONSULTATION DATE:  5/1 ?REFERRING MD:  Dr. Kathrynn Humble, CHIEF COMPLAINT:  Stridor  ? ?History of Present Illness:  ?61 year old female with PMH as below, which is significant for ulcerative colitis and asthmatic bronchitis.  She was admitted to [facility] in 2021 with an episode of acute stridor secondary to subglottic edema requiring emergent intubation in the emergency department.  The acute cause of edema was not identified, but was speculated to be viral in nature.  She was evaluated by ENT while inpatient but did not follow-up outpatient.  She was in her usual state of health until approximately mid April when she began to experience the feeling of throat tightness and occasional dyspnea.  She presented to her PCP and was prescribed prednisone.  She was on prednisone until approximately 4/30 and was feeling much better.  On 5/1 the edema significantly worsened to the point where she felt unable to move any air and presented to the emergency department as such.  She was urgently administered dexamethasone and racemic epinephrine neb in the emergency department and symptoms improved significantly.  With ongoing concern for airway compromise PCCM was consulted.  Of note, she was started valsartan approximately 3 months prior to the onset of her symptoms. ? ?Pertinent  Medical History  ? has a past medical history of Anxiety, Arthritis, Asthma, B12 deficiency, Chronic gastritis, GERD (gastroesophageal reflux disease), Hyperlipidemia, IBS (irritable bowel syndrome), Lupus ([place]), Pancreatitis, Rectal polyp (10/01/2015), and Ulcerative colitis. ? ? ?Significant Hospital Events: ?Including procedures, antibiotic start and stop dates in addition to other pertinent events   ?5/1 admitted with noisy upper airway sounds, CT without supra or subglottic pathology, symptomatic improvement with dexamethasone ? ?Interim  History / Subjective:  ?NAEON, feels better, breathing ok ? ?Objective   ?Blood pressure (!) 141/103, pulse 85, temperature 98.5 ?F (36.9 ?C), temperature source Oral, resp. rate 18, height 5' 9"  (1.753 m), weight 116.8 kg, last menstrual period 11/12/2016, SpO2 95 %. ?   ?   ? ?Intake/Output Summary (Last 24 hours) at 01/07/2022 0933 ?Last data filed at 01/07/2022 0900 ?Gross per 24 hour  ?Intake 146.57 ml  ?Output --  ?Net 146.57 ml  ? ?Filed Weights  ? 01/06/22 2224 01/07/22 0500  ?Weight: 116.9 kg 116.8 kg  ? ? ?Examination: ?General: overweight middle aged female in NAD ?HENT: [place]/AT, PERRL, no JVD. No obvious edema of visible oral mucosa.  ?Lungs: Clear bilateral breath sounds.  ?Cardiovascular: Tachy, regular, no MRG ?Abdomen: Soft, non-tender, non-distended ?Extremities: No acute deformity or ROM limitation ?Neuro: Alert, oriented, non-focal ? ? ?Resolved Hospital Problem list   ? ? ?Assessment & Plan:  ? ?Stridor: with history of subglottic edema requiring intubation in 2021. No clear etiology, but felt to possibly be viral. Now presenting with same. No viral prodrome to speak of. Responded to steroids and racemic. Of note, she was started on valsartan in January of this year. Fortunately she has responded well to steroids and is looking much better. CT neck and chest without pathology. Improved with below. Hoarseness persists. ?- Transfer out of ICU ?- Dexamethasone scheduled with end date, benadryl scheduled with end date, H2 blocker scheduled ?- PRN racemic epinephrine  ?- Has been seen by Dr. Benjamine Mola in the past if she required ENT consult.  ? ?Asthma: followed by PCP ?- PRN albuterol per home regimen ?- Start ICS/LABA nebs, recommend d/c with ICS/LABA  inhaler (symbicort, dulera, etc) ? ?Hyperglycemia:  Suspect DM given elevated BG in past, worsened with steroids. No diagnosis of DM. ?--a1c ? ?GERD ?- H2 blocker ? ?HTN ?- hold valsartan. With history of airway edema, may make sense to not restart ACE-I/ARB,  consider allergy testing as outpatient ? ? ?Best Practice (right click and "Reselect all SmartList Selections" daily)  ? ?Diet/type: clear liquids ?DVT prophylaxis: LMWH ?GI prophylaxis: PPI ?Lines: N/A ?Foley:  N/A ?Code Status:  full code ?Last date of multidisciplinary goals of care discussion [ ]  ? ?Transfer out of ICU ? ?Labs   ?CBC: ?Recent Labs  ?Lab 01/06/22 ?1520 01/07/22 ?0229  ?WBC 12.0* 13.2*  ?NEUTROABS 8.1*  --   ?HGB 13.9 12.9  ?HCT 42.2 38.6  ?MCV 88.3 85.6  ?PLT 432* 377  ? ? ? ?Basic Metabolic Panel: ?Recent Labs  ?Lab 01/06/22 ?1520 01/07/22 ?0229  ?NA 140 138  ?K 3.1* 3.8  ?CL 103 107  ?CO2 26 22  ?GLUCOSE 140* 171*  ?BUN 13 9  ?CREATININE 0.62 0.59  ?CALCIUM 9.1 9.5  ?MG  --  2.0  ?PHOS  --  2.3*  ? ? ?GFR: ?Estimated Creatinine Clearance: 100.7 mL/min (by C-G formula based on SCr of 0.59 mg/dL). ?Recent Labs  ?Lab 01/06/22 ?1520 01/06/22 ?1521 01/06/22 ?1715 01/07/22 ?0229  ?WBC 12.0*  --   --  13.2*  ?LATICACIDVEN  --  2.6* 3.1*  --   ? ? ? ?Liver Function Tests: ?No results for input(s): AST, ALT, ALKPHOS, BILITOT, PROT, ALBUMIN in the last 168 hours. ?No results for input(s): LIPASE, AMYLASE in the last 168 hours. ?No results for input(s): AMMONIA in the last 168 hours. ? ?ABG ?   ?Component Value Date/Time  ? PHART 7.350 05/25/2020 0146  ? PCO2ART 46.1 05/25/2020 0146  ? PO2ART 93.6 05/25/2020 0146  ? HCO3 28.5 (H) 01/06/2022 1515  ? ACIDBASEDEF 0.5 05/25/2020 0146  ? O2SAT 78.1 01/06/2022 1515  ? ?  ? ?Coagulation Profile: ?No results for input(s): INR, PROTIME in the last 168 hours. ? ?Cardiac Enzymes: ?No results for input(s): CKTOTAL, CKMB, CKMBINDEX, TROPONINI in the last 168 hours. ? ?HbA1C: ?Hgb A1c MFr Bld  ?Date/Time Value Ref Range Status  ?08/20/2012 08:24 AM 6.2 4.6 - 6.5 % Final  ?  Comment:  ?  Glycemic Control Guidelines for People with Diabetes:Non Diabetic:  <6%Goal of Therapy: <7%Additional Action Suggested:  >8%   ? ? ?CBG: ?Recent Labs  ?Lab 01/06/22 ?2218  ?GLUCAP 216*   ? ? ?Review of Systems:   ?N/a ? ?Past Medical History:  ?She,  has a past medical history of Anxiety, Arthritis, Asthma, B12 deficiency, Chronic gastritis, GERD (gastroesophageal reflux disease), Hyperlipidemia, IBS (irritable bowel syndrome), Lupus ([place]), Pancreatitis, Rectal polyp (10/01/2015), and Ulcerative colitis.  ? ?Surgical History:  ? ?Past Surgical History:  ?Procedure Laterality Date  ? BREAST CYST ASPIRATION    ? left  ? ESOPHAGUS SURGERY    ? SINUS SURGERY WITH INSTATRAK    ?  ? ?Social History:  ? reports that she has never smoked. She has never used smokeless tobacco. She reports that she does not drink alcohol and does not use drugs.  ? ?Family History:  ?Her family history includes COPD in her father; Diabetes in her maternal grandfather; Irritable bowel syndrome in her father. There is no history of Colon cancer or Stomach cancer.  ? ?Allergies ?Allergies  ?Allergen Reactions  ? Valsartan Swelling  ?  Possible angioedema with stridor but improved  with dexamethasone  ? Amoxicillin Other (See Comments)  ?  Abdominal pain  ? Infliximab Other (See Comments)  ?  "Lupus," joint pain and swelling ? ? ?  ? Molds & Smuts   ? Oseltamivir Nausea And Vomiting and Other (See Comments)  ?  Also, stomach cramps   ?  ? ?Home Medications  ?Prior to Admission medications   ?Medication Sig Start Date End Date Taking? Authorizing Provider  ?albuterol (PROVENTIL) (2.5 MG/3ML) 0.083% nebulizer solution Take 2.5 mg by nebulization as needed.      [provider]  ?buPROPion (WELLBUTRIN XL) 150 MG 24 hr tablet Take 1 tablet by mouth daily. 12/14/17   [provider]  ?Calcium Carbonate-Vitamin D (CALCIUM 500 + D PO) Take 1 capsule by mouth daily.      [provider]  ?cyanocobalamin (,VITAMIN B-12,) 1000 MCG/ML injection inject 1 ml every month ?Dispense with syringes and needles 01/16/20   Nandigam, Venia Minks, MD  ?dicyclomine (BENTYL) 20 MG tablet TAKE 1 TABLET BY MOUTH 4 TIMES DAILY WITH  MEALS AND AT BEDTIME 08/21/20   Nandigam, Venia Minks, MD  ?esomeprazole (NEXIUM) 40 MG capsule TAKE 1 CAPSULE BY MOUTH TWICE DAILY BEFORE A MEAL 11/19/21   Nandigam, Venia Minks, MD  ?ferrous sulfate 325 (65 FE) MG t

## 2022-01-08 ENCOUNTER — Telehealth: Payer: Self-pay | Admitting: Pulmonary Disease

## 2022-01-08 DIAGNOSIS — R0609 Other forms of dyspnea: Secondary | ICD-10-CM

## 2022-01-08 LAB — CBC WITH DIFFERENTIAL/PLATELET
Abs Immature Granulocytes: 0.12 10*3/uL — ABNORMAL HIGH (ref 0.00–0.07)
Basophils Absolute: 0 10*3/uL (ref 0.0–0.1)
Basophils Relative: 0 %
Eosinophils Absolute: 0 10*3/uL (ref 0.0–0.5)
Eosinophils Relative: 0 %
HCT: 37.9 % (ref 36.0–46.0)
Hemoglobin: 12.5 g/dL (ref 12.0–15.0)
Immature Granulocytes: 1 %
Lymphocytes Relative: 5 %
Lymphs Abs: 0.6 10*3/uL — ABNORMAL LOW (ref 0.7–4.0)
MCH: 28.5 pg (ref 26.0–34.0)
MCHC: 33 g/dL (ref 30.0–36.0)
MCV: 86.3 fL (ref 80.0–100.0)
Monocytes Absolute: 0.7 10*3/uL (ref 0.1–1.0)
Monocytes Relative: 5 %
Neutro Abs: 12.6 10*3/uL — ABNORMAL HIGH (ref 1.7–7.7)
Neutrophils Relative %: 89 %
Platelets: 363 10*3/uL (ref 150–400)
RBC: 4.39 MIL/uL (ref 3.87–5.11)
RDW: 14.1 % (ref 11.5–15.5)
WBC: 14 10*3/uL — ABNORMAL HIGH (ref 4.0–10.5)
nRBC: 0 % (ref 0.0–0.2)

## 2022-01-08 LAB — COMPREHENSIVE METABOLIC PANEL
ALT: 32 U/L (ref 0–44)
AST: 15 U/L (ref 15–41)
Albumin: 3.3 g/dL — ABNORMAL LOW (ref 3.5–5.0)
Alkaline Phosphatase: 77 U/L (ref 38–126)
Anion gap: 8 (ref 5–15)
BUN: 18 mg/dL (ref 8–23)
CO2: 25 mmol/L (ref 22–32)
Calcium: 9.3 mg/dL (ref 8.9–10.3)
Chloride: 107 mmol/L (ref 98–111)
Creatinine, Ser: 0.71 mg/dL (ref 0.44–1.00)
GFR, Estimated: >60 mL/min (ref >60)
Glucose, Bld: 163 mg/dL — ABNORMAL HIGH (ref 70–99)
Potassium: 3.6 mmol/L (ref 3.5–5.1)
Sodium: 140 mmol/L (ref 135–145)
Total Bilirubin: 0.5 mg/dL (ref 0.3–1.2)
Total Protein: 6.2 g/dL — ABNORMAL LOW (ref 6.5–8.1)

## 2022-01-08 LAB — PHOSPHORUS: Phosphorus: 3.8 mg/dL (ref 2.5–4.6)

## 2022-01-08 MED ORDER — FAMOTIDINE 20 MG PO TABS: 20.0000 mg | ORAL_TABLET | Freq: Two times a day (BID) | ORAL | 0 refills | Status: DC

## 2022-01-08 MED ORDER — PREDNISONE 10 MG PO TABS: ORAL_TABLET | ORAL | 0 refills | Status: DC

## 2022-01-08 MED ORDER — GUAIFENESIN ER 600 MG PO TB12: 600.0000 mg | ORAL_TABLET | Freq: Two times a day (BID) | ORAL | 2 refills | Status: AC

## 2022-01-08 MED ORDER — FEXOFENADINE HCL 60 MG PO TABS
60.0000 mg | ORAL_TABLET | Freq: Two times a day (BID) | ORAL | 0 refills | Status: DC
Start: 2022-01-08 — End: 2023-01-28

## 2022-01-08 NOTE — Telephone Encounter (Signed)
Patient has been scheduled for first available PFT and OV on 02/24/2022 at 2pm- appt reminder and PFT instructions mailed to address on file. Nothing further needed. ?

## 2022-01-08 NOTE — Progress Notes (Signed)
Spero Curb to be D/C'd Home per MD order.  Discussed with the patient and all questions fully answered. ? ?VSS, Skin clean, dry and intact without evidence of skin break down, no evidence of skin tears noted. ?IV catheter discontinued intact. Site without signs and symptoms of complications. Dressing and pressure applied. ? ?An After Visit Summary was printed and given to the patient. Patient received prescription. ? ?D/c education completed with patient/family including follow up instructions, medication list, d/c activities limitations if indicated, with other d/c instructions as indicated by MD - patient able to verbalize understanding, all questions fully answered.  ? ?Patient instructed to return to ED, call 911, or call MD for any changes in condition.  ? ?Patient escorted via [place], and D/C home via private auto.  ? ? ? ?Kaitlyn Jennings ?01/08/2022 4:50 PM  ?

## 2022-01-08 NOTE — Telephone Encounter (Signed)
Please schedule hospital follow up 30 min visit with full PFT prior to visit with me, approximately 4 weeks from now. Thanks! ?

## 2022-01-10 NOTE — Discharge Summary (Signed)
?Physician Discharge Summary ?  ?Patient: Kaitlyn Jennings MRN: [id number] DOB: [birth year]  ?Admit date:     01/06/2022  ?Discharge date: 01/08/2022  ?Discharge Physician: Berle Mull  ?PCP: Manfred Shirts, PA ? ?Recommendations at discharge: ?Follow up with ENT and PCCM  ? ?Discharge Diagnoses: ?Principal Problem: ?  Stridor ? ?Hospital Course: ?Admitted with noisy upper airway sounds, CT without supra or subglottic pathology, symptomatic improvement with dexamethasone. ? ?Assessment and Plan: ?Stridor:  ?With history of subglottic edema requiring intubation in 2021.  ?No clear etiology, but felt to possibly be viral. Now presenting with same.  ?No viral prodrome to speak of.  ?Responded to steroids and racemic.  ?Of note, she was started on valsartan in January of this year.  ?Fortunately she has responded well to steroids and is looking much better.  ?CT neck and chest without pathology.  ?Hoarseness persists. ?prednisone benadryl and H2 blocker scheduled ?Has been seen by Dr. Benjamine Mola in the past and will benefit from reconsult for recurrent throat closure complains which does not sound like respiratory in nature. ?  ?Asthma: followed by PCP ?PRN albuterol per home regimen ?  ?Hyperglycemia:   ?Due to steroids ?  ?GERD ?H2 blocker ?  ?HTN ?hold valsartan.  ?With history of airway edema, may make sense to not restart ACE-I/ARB, consider allergy testing as outpatient ? ?Obesity  ?Will benefit from sleep apnea work up.  ?Body mass index is 38.42 kg/m?.  ? ?Consultants: PCCM priamry admission  ?Procedures performed:  ?none ?DISCHARGE MEDICATION: ?Allergies as of 01/08/2022   ? ?   Reactions  ? Valsartan Swelling  ? Possible angioedema with stridor but improved with dexamethasone  ? Amoxicillin Other (See Comments)  ? Abdominal pain  ? Infliximab Other (See Comments)  ? "Lupus," joint pain and swelling  ? Molds & Smuts   ? Oseltamivir Nausea And Vomiting, Other (See Comments)  ? Also, stomach cramps   ? ?  ? ?  ?Medication List   ?  ? ?STOP taking these medications   ? ?sucralfate 1 g tablet ?Commonly known as: CARAFATE ?  ?traMADol 50 MG tablet ?Commonly known as: ULTRAM ?  ?valsartan 80 MG tablet ?Commonly known as: DIOVAN ?  ? ?  ? ?TAKE these medications   ? ?albuterol (2.5 MG/3ML) 0.083% nebulizer solution ?Commonly known as: PROVENTIL ?Take 2.5 mg by nebulization as needed. ?  ?albuterol 108 (90 Base) MCG/ACT inhaler ?Commonly known as: VENTOLIN HFA ?Inhale 2 puffs into the lungs every 4 (four) hours as needed for wheezing or shortness of breath. ?  ?CALCIUM 500 + D PO ?Take 1 capsule by mouth daily. ?  ?ciclopirox 8 % solution ?Commonly known as: PENLAC ?Apply 1 application. topically at bedtime. Apply over nail and surrounding skin. Apply daily over nail coat. After seven (7) days, may remove continue cycle. ?  ?vitamin B-12 1000 MCG tablet ?Commonly known as: CYANOCOBALAMIN ?Take 2,000 mcg by mouth daily. ?  ?cyanocobalamin 1000 MCG/ML injection ?Commonly known as: (VITAMIN B-12) ?inject 1 ml every month ?Dispense with syringes and needles ?  ?dicyclomine 20 MG tablet ?Commonly known as: BENTYL ?TAKE 1 TABLET BY MOUTH 4 TIMES DAILY WITH MEALS AND AT BEDTIME ?What changed: See the new instructions. ?  ?Dulera 200-5 MCG/ACT Aero ?Generic drug: mometasone-formoterol ?Inhale 2 puffs into the lungs 2 (two) times daily. ?  ?esomeprazole 40 MG capsule ?Commonly known as: [place] ?TAKE 1 CAPSULE BY MOUTH TWICE DAILY BEFORE A MEAL ?What changed: See the new instructions. ?  ?  famotidine 20 MG tablet ?Commonly known as: PEPCID ?Take 1 tablet (20 mg total) by mouth 2 (two) times daily for 14 days. ?  ?ferrous sulfate 325 (65 FE) MG tablet ?Take 325 mg by mouth daily with breakfast. ?  ?fexofenadine 60 MG tablet ?Commonly known as: ALLEGRA ?Take 1 tablet (60 mg total) by mouth 2 (two) times daily. ?  ?fluticasone 50 MCG/ACT nasal spray ?Commonly known as: FLONASE ?Place 2 sprays into both nostrils daily. ?  ?folic acid 1 MG tablet ?Commonly  known as: FOLVITE ?Take 1 tablet (1 mg total) by mouth daily. ?  ?guaiFENesin 600 MG 12 hr tablet ?Commonly known as: Mucinex ?Take 1 tablet (600 mg total) by mouth 2 (two) times daily. ?  ?mercaptopurine 50 MG tablet ?Commonly known as: PURINETHOL ?TAKE 1 TABLET BY MOUTH ONCE DAILY ON AN EMPTY STOMACH AFTER A MEAL ?What changed:  ?how much to take ?how to take this ?when to take this ?additional instructions ?  ?mesalamine 1.2 g EC tablet ?Commonly known as: LIALDA ?Take 4 tablets (4.8 g total) by mouth daily with breakfast. ?  ?mupirocin ointment 2 % ?Commonly known as: BACTROBAN ?Apply 1 application. topically 3 (three) times daily as needed. ?  ?Needles & Syringes Misc ?Use to inject B12 weekly x 4 then every month ?  ?nystatin ointment ?Commonly known as: MYCOSTATIN ?Apply 1 application. topically 2 (two) times daily as needed (skin irriation). ?  ?ondansetron 4 MG tablet ?Commonly known as: ZOFRAN ?Take 4 mg by mouth every 8 (eight) hours as needed for nausea. ?  ?polyethylene glycol powder 17 GM/SCOOP powder ?Commonly known as: GLYCOLAX/MIRALAX ?Take 17 g by mouth daily as needed for mild constipation. ?  ?predniSONE 10 MG tablet ?Commonly known as: DELTASONE ?Take 77m daily for 3days,Take 388mdaily for 3days,Take 2032maily for 3days,Take 67m43mily for 3days, then stop ?  ?rosuvastatin 10 MG tablet ?Commonly known as: CRESTOR ?Take 10 mg by mouth daily. ?  ?sulfaSALAzine 500 MG tablet ?Commonly known as: AZULFIDINE ?Take 500 mg by mouth in the morning and at bedtime. ?  ?venlafaxine XR 150 MG 24 hr capsule ?Commonly known as: EFFEXOR-XR ?Take 150 mg by mouth daily with breakfast. ?  ? ?  ? ? Follow-up Information   ? ? BeanManfred Shirts. Schedule an appointment as soon as possible for a visit in 1 week(s).   ?Specialty: General Practice ?Why: will need ENT referral and allergy test. ?Contact information: ?[phone] ?  ?  ? ?  ?  ? ?  ? ?Disposition: Home ?Diet  recommendation: Cardiac diet ? ?Discharge Exam: ?Vitals:  ? 01/08/22 1200 01/08/22 1300 01/08/22 1400 01/08/22 1500  ?BP: 130/90 135/88 135/81 133/81  ?Pulse: 72 79 80 82  ?Resp: 13 14 14 17   ?Temp:      ?TempSrc:      ?SpO2: 91%   98%  ?Weight:      ?Height:      ? ?General: Appear in mild distress; no visible Abnormal Neck Mass Or lumps, Conjunctiva normal ?Cardiovascular: S1 and S2 Present, no Murmur, ?Respiratory: good respiratory effort, Bilateral Air entry present and CTA, no Crackles, no wheezes ?Abdomen: Bowel Sound present, Non tender ?Extremities: no Pedal edema ?Neurology: alert and oriented to time, place, and person ?Gait not checked due to patient safety concerns ?Filed Weights  ? 01/06/22 2224 01/07/22 0500 01/08/22 0500  ?Weight: 116.9 kg 116.8 kg 118 kg  ? ?Condition at  discharge: stable ? ?The results of significant diagnostics from this hospitalization (including imaging, microbiology, ancillary and laboratory) are listed below for reference.  ? ?Imaging Studies: ?CT Soft Tissue Neck W Contrast ? ?Result Date: 01/06/2022 ?CLINICAL DATA:  Soft tissue swelling. Shortness of breath and wheezing. EXAM: CT NECK WITH CONTRAST TECHNIQUE: Multidetector CT imaging of the neck was performed using the standard protocol following the bolus administration of intravenous contrast. RADIATION DOSE REDUCTION: This exam was performed according to the departmental dose-optimization program which includes automated exposure control, adjustment of the mA and/or kV according to patient size and/or use of iterative reconstruction technique. CONTRAST:  64m OMNIPAQUE IOHEXOL 300 MG/ML  SOLN COMPARISON:  Neck CT 05/24/2020 FINDINGS: Pharynx and larynx: No evidence of a mass or significant swelling. Patent airway. No fluid collection or inflammatory changes in the parapharyngeal or retropharyngeal spaces. Salivary glands: No inflammation, mass, or stone. Thyroid: Unremarkable. Lymph nodes: No enlarged or suspicious lymph  nodes in the neck. Vascular: Major vascular structures of the neck are grossly patent. Limited intracranial: Unremarkable. Visualized orbits: Unremarkable. Mastoids and visualized paranasal sinuses: Clear. [place]

## 2022-01-27 ENCOUNTER — Other Ambulatory Visit: Payer: Self-pay | Admitting: Gastroenterology

## 2022-01-30 ENCOUNTER — Other Ambulatory Visit: Payer: Self-pay | Admitting: Gastroenterology

## 2022-02-20 ENCOUNTER — Other Ambulatory Visit: Payer: Self-pay | Admitting: Gastroenterology

## 2022-02-20 NOTE — Telephone Encounter (Signed)
Must keep her appointment for 6/20

## 2022-02-24 ENCOUNTER — Telehealth: Payer: Self-pay | Admitting: Gastroenterology

## 2022-02-24 ENCOUNTER — Inpatient Hospital Stay: Payer: Medicare Other | Admitting: Pulmonary Disease

## 2022-02-24 MED ORDER — MESALAMINE 1.2 G PO TBEC: DELAYED_RELEASE_TABLET | ORAL | 0 refills | Status: DC

## 2022-02-24 NOTE — Telephone Encounter (Signed)
Inbound call from patient stating has an appointment tomorrow at 8:30 for a medication refill. Patient stated that she is sick and is coughing, has a headache, and sore throat. Patient is seeking advice if she reschedules if she will not able to get her medication or not. Please advise.

## 2022-02-24 NOTE — Telephone Encounter (Signed)
Patient called wanting to know if she could get her prescriptions refilled if she doesn't make it to her appointment tomorrow.  She can barely speak and says she's running a fever of about 101.  Please advise.  Thank you.

## 2022-02-24 NOTE — Telephone Encounter (Signed)
Dr Silverio Decamp, Please advise   By her appointment list she has cancelled at least 5 appointments with you .Kaitlyn Jennings

## 2022-02-24 NOTE — Telephone Encounter (Signed)
Lialda sent I rescheduled pt.

## 2022-02-25 ENCOUNTER — Ambulatory Visit: Payer: Medicare Other | Admitting: Gastroenterology

## 2022-02-25 NOTE — Telephone Encounter (Signed)
Okay, thanks

## 2022-03-16 ENCOUNTER — Other Ambulatory Visit: Payer: Self-pay | Admitting: Gastroenterology

## 2022-04-02 ENCOUNTER — Ambulatory Visit: Payer: Medicare Other | Admitting: Pulmonary Disease

## 2022-04-03 ENCOUNTER — Inpatient Hospital Stay: Payer: Medicare Other | Admitting: Pulmonary Disease

## 2022-04-09 ENCOUNTER — Ambulatory Visit: Payer: Medicare Other | Admitting: Gastroenterology

## 2022-04-24 ENCOUNTER — Telehealth: Payer: Self-pay | Admitting: Gastroenterology

## 2022-04-24 MED ORDER — MESALAMINE 1.2 G PO TBEC: DELAYED_RELEASE_TABLET | ORAL | 0 refills | Status: DC

## 2022-04-24 NOTE — Telephone Encounter (Signed)
Let's give her 1 month and no refills. That will give me an opportunity to talk with her.

## 2022-04-24 NOTE — Telephone Encounter (Signed)
Kaitlyn Jennings, I need your opinion on this.Marland KitchenMarland KitchenShe has cancelled the last 10 appointments. No Im not exaggerating. Everytime I fill her med she cancels her appointment  Dr Silverio Decamp already said she needs to be seen. What is your opinion. If I refill this she will cancel her appt Im sure of it.

## 2022-04-24 NOTE — Telephone Encounter (Signed)
Patient called, states she needs approval on mesalamine 1.2 g and mercaptopurine 50 mg medications. Please call to follow.

## 2022-04-24 NOTE — Telephone Encounter (Signed)
Kaitlyn Jennings I sent it in for her  Thanks!

## 2022-05-28 ENCOUNTER — Telehealth: Payer: Self-pay | Admitting: Gastroenterology

## 2022-05-28 ENCOUNTER — Other Ambulatory Visit: Payer: Self-pay

## 2022-05-28 MED ORDER — MERCAPTOPURINE 50 MG PO TABS: ORAL_TABLET | ORAL | 0 refills | Status: DC

## 2022-05-28 NOTE — Telephone Encounter (Signed)
Patient called states she needs Mercaptopurine 50 mg refill. Thankx

## 2022-05-28 NOTE — Telephone Encounter (Signed)
Last office visit was 09/22/2019. Called the patient. No answer. Left her a voicemail that her medication will be refilled for 30 days. She must keep the scheduled appointment 06/10/22 for further refills.

## 2022-05-28 NOTE — Telephone Encounter (Signed)
Agree, thanks

## 2022-06-02 ENCOUNTER — Other Ambulatory Visit: Payer: Self-pay

## 2022-06-02 MED ORDER — MERCAPTOPURINE 50 MG PO TABS: ORAL_TABLET | ORAL | 0 refills | Status: DC

## 2022-06-02 NOTE — Telephone Encounter (Signed)
Prescription transmitted to the pharmacy.

## 2022-06-02 NOTE — Telephone Encounter (Signed)
Inbound call from patient stating [facility] never received her rx refill. Please advise.

## 2022-06-03 ENCOUNTER — Other Ambulatory Visit: Payer: Self-pay | Admitting: Gastroenterology

## 2022-06-03 NOTE — Telephone Encounter (Signed)
Inbound call from patient stating that she called the pharmacy this morning and they stated that they still did not have prescription for  Lialda. Patient is requesting it be resent. Please advise.

## 2022-06-03 NOTE — Telephone Encounter (Signed)
mesalamine (LIALDA) 1.2 g EC tablet 120 tablet 0 06/03/2022    Sig: TAKE 4 TABLETS BY MOUTH ONCE DAILY WITH BREAKFAST   Sent to pharmacy as: mesalamine (LIALDA) 1.2 g EC tablet   E-Prescribing Status: Receipt confirmed by pharmacy (06/03/2022  9:39 AM EDT)

## 2022-06-04 ENCOUNTER — Inpatient Hospital Stay: Payer: Medicare Other | Admitting: Pulmonary Disease

## 2022-06-10 ENCOUNTER — Encounter: Payer: Self-pay | Admitting: Gastroenterology

## 2022-06-10 ENCOUNTER — Ambulatory Visit: Payer: Medicare Other | Admitting: Gastroenterology

## 2022-06-10 ENCOUNTER — Other Ambulatory Visit (INDEPENDENT_AMBULATORY_CARE_PROVIDER_SITE_OTHER): Payer: Medicare Other

## 2022-06-10 VITALS — BP 120/80 | HR 79 | Ht 69.0 in | Wt 261.0 lb

## 2022-06-10 DIAGNOSIS — R0602 Shortness of breath: Secondary | ICD-10-CM

## 2022-06-10 DIAGNOSIS — K5100 Ulcerative (chronic) pancolitis without complications: Secondary | ICD-10-CM | POA: Diagnosis not present

## 2022-06-10 LAB — COMPREHENSIVE METABOLIC PANEL
ALT: 14 U/L (ref 0–35)
AST: 11 U/L (ref 0–37)
Albumin: 4.3 g/dL (ref 3.5–5.2)
Alkaline Phosphatase: 109 U/L (ref 39–117)
BUN: 8 mg/dL (ref 6–23)
CO2: 28 mEq/L (ref 19–32)
Calcium: 9.4 mg/dL (ref 8.4–10.5)
Chloride: 106 mEq/L (ref 96–112)
Creatinine, Ser: 0.74 mg/dL (ref 0.40–1.20)
GFR: 87.05 mL/min (ref >60.00)
Glucose, Bld: 124 mg/dL — ABNORMAL HIGH (ref 70–99)
Potassium: 3.3 mEq/L — ABNORMAL LOW (ref 3.5–5.1)
Sodium: 143 mEq/L (ref 135–145)
Total Bilirubin: 0.3 mg/dL (ref 0.2–1.2)
Total Protein: 7 g/dL (ref 6.0–8.3)

## 2022-06-10 LAB — CBC WITH DIFFERENTIAL/PLATELET
Basophils Absolute: 0 10*3/uL (ref 0.0–0.1)
Basophils Relative: 0.5 % (ref 0.0–3.0)
Eosinophils Absolute: 0.2 10*3/uL (ref 0.0–0.7)
Eosinophils Relative: 2.7 % (ref 0.0–5.0)
HCT: 38.4 % (ref 36.0–46.0)
Hemoglobin: 12.7 g/dL (ref 12.0–15.0)
Lymphocytes Relative: 17.6 % (ref 12.0–46.0)
Lymphs Abs: 1.2 10*3/uL (ref 0.7–4.0)
MCHC: 33.2 g/dL (ref 30.0–36.0)
MCV: 84.5 fl (ref 78.0–100.0)
Monocytes Absolute: 0.4 10*3/uL (ref 0.1–1.0)
Monocytes Relative: 6.1 % (ref 3.0–12.0)
Neutro Abs: 5 10*3/uL (ref 1.4–7.7)
Neutrophils Relative %: 73.1 % (ref 43.0–77.0)
Platelets: 321 10*3/uL (ref 150.0–400.0)
RBC: 4.54 Mil/uL (ref 3.87–5.11)
RDW: 14 % (ref 11.5–15.5)
WBC: 6.8 10*3/uL (ref 4.0–10.5)

## 2022-06-10 LAB — IBC + FERRITIN
Ferritin: 41.8 ng/mL (ref 10.0–291.0)
Iron: 70 ug/dL (ref 42–145)
Saturation Ratios: 22.3 % (ref 20.0–50.0)
TIBC: 313.6 ug/dL (ref 250.0–450.0)
Transferrin: 224 mg/dL (ref 212.0–360.0)

## 2022-06-10 LAB — FOLATE: Folate: >23.9 ng/mL (ref >5.9)

## 2022-06-10 LAB — VITAMIN D 25 HYDROXY (VIT D DEFICIENCY, FRACTURES): VITD: 33.96 ng/mL (ref 30.00–100.00)

## 2022-06-10 LAB — SEDIMENTATION RATE: Sed Rate: 40 mm/hr — ABNORMAL HIGH (ref 0–30)

## 2022-06-10 LAB — HIGH SENSITIVITY CRP: CRP, High Sensitivity: 4.6 mg/L (ref 0.000–5.000)

## 2022-06-10 LAB — VITAMIN B12: Vitamin B-12: 971 pg/mL — ABNORMAL HIGH (ref 211–911)

## 2022-06-10 MED ORDER — SULFASALAZINE 500 MG PO TABS: 500.0000 mg | ORAL_TABLET | Freq: Two times a day (BID) | ORAL | 3 refills | Status: DC

## 2022-06-10 MED ORDER — FOLIC ACID 1 MG PO TABS: 1.0000 mg | ORAL_TABLET | Freq: Every day | ORAL | 0 refills | Status: DC

## 2022-06-10 NOTE — Patient Instructions (Addendum)
If you are age 62 or older, your body mass index should be between 23-30. Your Body mass index is 38.54 kg/m. If this is out of the aforementioned range listed, please consider follow up with your Primary Care Provider.  If you are age 69 or younger, your body mass index should be between 19-25. Your Body mass index is 38.54 kg/m. If this is out of the aformentioned range listed, please consider follow up with your Primary Care Provider.   ________________________________________________________    We have sent the following medications to your pharmacy for you to pick up at your convenience: Sulfasalazine Folic acid  Please go to the lab in the basement of our building to have lab work done as you leave today. Hit "B" for basement when you get on the elevator.  When the doors open the lab is on your left.  We will call you with the results. Thank you.  We have scheduled you for an appointment with Dr. Lake Bells at [facility] located at [address] on Thursday, 06-19-22 at 8:30 am.   We have scheduled you for a follow up appointment with Dr. Silverio Decamp on Thursday, 12-21 at 8:50 am.  I appreciate the opportunity to care for you. Thank you for choosing me and [facility],  Dr. Harl Bowie

## 2022-06-10 NOTE — Progress Notes (Signed)
Kaitlyn Jennings    [id number]    20-Oct-1960  Primary Care Physician:Beane, Mirian Mo, PA  Referring Physician: Manfred Shirts, [place],  [place] [address]   Chief complaint:  Ulcerative colitis  HPI: 61 year old very pleasant female here for follow-up visit for ulcerative colitis, last office visit in January 2021.  She was lost to follow-up.  Patient mentions that overall her GI symptoms are currently under good control.  She has on average 2-3 formed bowel movements per day, no blood in stool Intermittent abd pain when she was taken off sulfasalazine during hospitalization but is better since she got back on it. Hospitalized in May 2023 with acute respiratory distress, was treated with steroid taper.  She continues to have intermittent stridor and wheezing.  She has not followed up with pulmonary posthospitalization.  She has significant shortness of breath with any activity or exertion   GI history: Ulcerative colitis initially diagnosed in 1998 Allergic reaction to Remicade, was maintained on Humira with loss of clinical response 2016 (elevated antibody level with undetectable drug trough), switched to [facility] May 2017, was discontinued by patient due to lack of adequate insurance coverage with high out-of-pocket deductible.  Subsequently switched to [facility] in 2018, but she has missed multiple infusions and was getting it erratically. Morrie Sheldon had higher out-of-pocket deductible, patient did not want to start it.     Colonoscopy December 25, 2016: Moderately active proctitis, 12 mm polyp removed (sessile serrated adenoma)     Outpatient Encounter Medications as of 06/10/2022  Medication Sig   albuterol (PROVENTIL) (2.5 MG/3ML) 0.083% nebulizer solution Take 2.5 mg by nebulization as needed.     albuterol (VENTOLIN HFA) 108 (90 Base) MCG/ACT inhaler Inhale 2 puffs into the lungs every 4 (four) hours as needed for wheezing or shortness of breath.   Calcium  Carbonate-Vitamin D (CALCIUM 500 + D PO) Take 1 capsule by mouth daily.     dicyclomine (BENTYL) 20 MG tablet TAKE 1 TABLET BY MOUTH 4 TIMES DAILY WITH MEALS AND AT BEDTIME (Patient taking differently: Take 20 mg by mouth 4 (four) times daily as needed for spasms.)   ferrous sulfate 325 (65 FE) MG tablet Take 325 mg by mouth daily with breakfast.     fexofenadine (ALLEGRA) 60 MG tablet Take 1 tablet (60 mg total) by mouth 2 (two) times daily.   fluticasone (FLONASE) 50 MCG/ACT nasal spray Place 2 sprays into both nostrils daily.   folic acid (FOLVITE) 1 MG tablet Take 1 tablet by mouth once daily   guaiFENesin (MUCINEX) 600 MG 12 hr tablet Take 1 tablet (600 mg total) by mouth 2 (two) times daily.   mercaptopurine (PURINETHOL) 50 MG tablet Give on an empty stomach 1 hour before or 2 hours after meals. Caution: Chemotherapy. Keep appt for further refills   mesalamine (LIALDA) 1.2 g EC tablet TAKE 4 TABLETS BY MOUTH ONCE DAILY WITH BREAKFAST   mometasone-formoterol (DULERA) 200-5 MCG/ACT AERO Inhale 2 puffs into the lungs 2 (two) times daily.   mupirocin ointment (BACTROBAN) 2 % Apply 1 application. topically 3 (three) times daily as needed.   nystatin ointment (MYCOSTATIN) Apply 1 application. topically 2 (two) times daily as needed (skin irriation).   ondansetron (ZOFRAN) 4 MG tablet Take 4 mg by mouth every 8 (eight) hours as needed for nausea.    polyethylene glycol powder (GLYCOLAX/MIRALAX) powder Take 17 g by mouth daily as needed for mild  constipation.   rosuvastatin (CRESTOR) 10 MG tablet Take 10 mg by mouth daily.     sulfaSALAzine (AZULFIDINE) 500 MG tablet Take 500 mg by mouth in the morning and at bedtime.   venlafaxine XR (EFFEXOR-XR) 150 MG 24 hr capsule Take 150 mg by mouth daily with breakfast.   vitamin B-12 (CYANOCOBALAMIN) 1000 MCG tablet Take 2,000 mcg by mouth daily.   famotidine (PEPCID) 20 MG tablet Take 1 tablet (20 mg total) by mouth 2 (two) times daily for 14 days.    [DISCONTINUED] ciclopirox (PENLAC) 8 % solution Apply 1 application. topically at bedtime. Apply over nail and surrounding skin. Apply daily over nail coat. After seven (7) days, may remove continue cycle.   [DISCONTINUED] cyanocobalamin (,VITAMIN B-12,) 1000 MCG/ML injection inject 1 ml every month Dispense with syringes and needles (Patient not taking: Reported on 01/07/2022)   [DISCONTINUED] esomeprazole (NEXIUM) 40 MG capsule TAKE 1 CAPSULE BY MOUTH TWICE DAILY BEFORE A MEAL (Patient taking differently: Take 40 mg by mouth daily.)   [DISCONTINUED] Needles & Syringes MISC Use to inject B12 weekly x 4 then every month   [DISCONTINUED] predniSONE (DELTASONE) 10 MG tablet Take 58m daily for 3days,Take 370mdaily for 3days,Take 2024maily for 3days,Take 33m69mily for 3days, then stop   No facility-administered encounter medications on file as of 06/10/2022.    Allergies as of 06/10/2022 - Review Complete 06/10/2022  Allergen Reaction Noted   Valsartan Swelling 01/07/2022   Amoxicillin Other (See Comments) 08/08/2014   Infliximab Other (See Comments) 08/26/2006   Molds & smuts  02/09/2013   Oseltamivir Nausea And Vomiting and Other (See Comments) 10/09/2016    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   Asthma    B12 deficiency    Chronic gastritis    GERD (gastroesophageal reflux disease)    Hyperlipidemia    IBS (irritable bowel syndrome)    Lupus (HCC)    secondary to Rmicade   Pancreatitis    Rectal polyp 10/01/2015   Ulcerative colitis     Past Surgical History:  Procedure Laterality Date   BREAST CYST ASPIRATION     left   ESOPHAGUS SURGERY     SINUS SURGERY WITH INSTATRAK      Family History  Problem Relation Age of Onset   Irritable bowel syndrome Father    COPD Father    Diabetes Maternal Grandfather    Colon cancer Neg Hx    Stomach cancer Neg Hx     Social History   Socioeconomic History   Marital status:  Married    Spouse name: Not on file   Number of children: 0   Years of education: Not on file   Highest education level: Not on file  Occupational History   Occupation: FLIGHT ATTENDANT  Tobacco Use   Smoking status: Never   Smokeless tobacco: Never  Vaping Use   Vaping Use: Never used  Substance and Sexual Activity   Alcohol use: No    Alcohol/week: 0.0 standard drinks of alcohol   Drug use: No   Sexual activity: Yes    Partners: Male    Birth control/protection: Pill  Other Topics Concern   Not on file  Social History Narrative   Not on file   Social Determinants of Health   Financial Resource Strain: Not on file  Food Insecurity: Not on file  Transportation Needs: Not on file  Physical Activity: Not on  file  Stress: Not on file  Social Connections: Not on file  Intimate Partner Violence: Not on file      Review of systems: All other review of systems negative except as mentioned in the HPI.   Physical Exam: Vitals:   06/10/22 0829  BP: 120/80  Pulse: 79   Body mass index is 38.54 kg/m. 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 We will request optimization of her pulmonary status and clearance prior to scheduling colonoscopy    Return in 2 months or sooner if needed   This visit required 40 minutes of patient care (this includes precharting, chart review, review of results, face-to-face time used for counseling as well as treatment plan and follow-up. The patient was provided an opportunity to ask questions and all were answered. The patient agreed with the plan and demonstrated an understanding of the instructions.   Damaris Hippo , MD    CC: Manfred Shirts, PA

## 2022-06-11 ENCOUNTER — Encounter: Payer: Self-pay | Admitting: Gastroenterology

## 2022-06-12 ENCOUNTER — Telehealth: Payer: Self-pay

## 2022-06-12 ENCOUNTER — Other Ambulatory Visit: Payer: Medicare Other

## 2022-06-12 DIAGNOSIS — K5100 Ulcerative (chronic) pancolitis without complications: Secondary | ICD-10-CM

## 2022-06-12 NOTE — Telephone Encounter (Signed)
Dr. Silverio Decamp - please see MyChart exchange with patient regarding Disability Forms. She is still requesting that you complete her paperwork. Please respond to patient 's message via [place]. Thank you

## 2022-06-14 NOTE — Progress Notes (Deleted)
Synopsis: Referred in 06/2022 for Dyspnea, clearance for colonoscopy   Subjective:   PATIENT ID: [place]: female DOB: 11-Apr-1961, MRN: [id number]   HPI  No chief complaint on file.   ***  Record review: Records from her last GI visit reviewed where she was seen for ulcerative colitis.  She is due for screening colonoscopy so she was referred to [place] for pulmonary clearance with the reason being that she has been hospitalized several times in the last few years for breathing issues.  Specifically, 2021 she was hospitalized for stridor, required emergent intubation.  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  In addition in late April 2023 she was hospitalized again for stridor and upper airway compromise.  Fortunately this time she did not require intubation and she was treated with Decadron and racemic epinephrine and made a full recovery and was able to be discharged after 2 days.  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   Asthma    B12 deficiency    Chronic gastritis    GERD (gastroesophageal reflux disease)    Hyperlipidemia    IBS (irritable bowel syndrome)    Lupus (HCC)    secondary to Rmicade   Pancreatitis    Rectal polyp 10/01/2015   Ulcerative colitis      Family History  Problem Relation Age of Onset   Irritable bowel syndrome Father    COPD Father    Diabetes Maternal Grandfather    Colon cancer Neg Hx    Stomach cancer Neg Hx      Social History   Socioeconomic History   Marital status: Married    Spouse name: Not on file   Number of children: 0   Years of education: Not on file   Highest education level: Not on file  Occupational History   Occupation: FLIGHT ATTENDANT  Tobacco Use    Smoking status: Never   Smokeless tobacco: Never  Vaping Use   Vaping Use: Never used  Substance and Sexual Activity   Alcohol use: No    Alcohol/week: 0.0 standard drinks of alcohol   Drug use: No   Sexual activity: Yes    Partners: Male    Birth control/protection: Pill  Other Topics Concern   Not on file  Social History Narrative   Not on file   Social Determinants of Health   Financial Resource Strain: Not on file  Food Insecurity: Not on file  Transportation Needs: Not on file  Physical Activity: Not on file  Stress: Not on file  Social Connections: Not on file  Intimate Partner Violence: Not on file     Allergies  Allergen Reactions   Valsartan Swelling    Possible angioedema with stridor but improved with dexamethasone   Amoxicillin Other (See Comments)    Abdominal pain   Infliximab Other (See Comments)    "Lupus," joint pain and swelling      Molds & Smuts    Oseltamivir Nausea And Vomiting and Other (See Comments)    Also, stomach cramps      Outpatient Medications Prior to Visit  Medication Sig Dispense Refill   albuterol (PROVENTIL) (2.5 MG/3ML) 0.083% nebulizer solution Take 2.5 mg by nebulization as  needed.       albuterol (VENTOLIN HFA) 108 (90 Base) MCG/ACT inhaler Inhale 2 puffs into the lungs every 4 (four) hours as needed for wheezing or shortness of breath.     Calcium Carbonate-Vitamin D (CALCIUM 500 + D PO) Take 1 capsule by mouth daily.       dicyclomine (BENTYL) 20 MG tablet TAKE 1 TABLET BY MOUTH 4 TIMES DAILY WITH MEALS AND AT BEDTIME (Patient taking differently: Take 20 mg by mouth 4 (four) times daily as needed for spasms.) 120 tablet 0   famotidine (PEPCID) 20 MG tablet Take 1 tablet (20 mg total) by mouth 2 (two) times daily for 14 days. 28 tablet 0   ferrous sulfate 325 (65 FE) MG tablet Take 325 mg by mouth daily with breakfast.       fexofenadine (ALLEGRA) 60 MG tablet Take 1 tablet (60 mg total) by mouth 2 (two) times daily. 30  tablet 0   fluticasone (FLONASE) 50 MCG/ACT nasal spray Place 2 sprays into both nostrils daily.     folic acid (FOLVITE) 1 MG tablet Take 1 tablet (1 mg total) by mouth daily. 90 tablet 0   guaiFENesin (MUCINEX) 600 MG 12 hr tablet Take 1 tablet (600 mg total) by mouth 2 (two) times daily. 60 tablet 2   mometasone-formoterol (DULERA) 200-5 MCG/ACT AERO Inhale 2 puffs into the lungs 2 (two) times daily.     mupirocin ointment (BACTROBAN) 2 % Apply 1 application. topically 3 (three) times daily as needed.     nystatin ointment (MYCOSTATIN) Apply 1 application. topically 2 (two) times daily as needed (skin irriation).     ondansetron (ZOFRAN) 4 MG tablet Take 4 mg by mouth every 8 (eight) hours as needed for nausea.      polyethylene glycol powder (GLYCOLAX/MIRALAX) powder Take 17 g by mouth daily as needed for mild constipation.     rosuvastatin (CRESTOR) 10 MG tablet Take 10 mg by mouth daily.       sulfaSALAzine (AZULFIDINE) 500 MG tablet Take 1 tablet (500 mg total) by mouth in the morning and at bedtime. 60 tablet 3   venlafaxine XR (EFFEXOR-XR) 150 MG 24 hr capsule Take 150 mg by mouth daily with breakfast.     vitamin B-12 (CYANOCOBALAMIN) 1000 MCG tablet Take 2,000 mcg by mouth daily.     No facility-administered medications prior to visit.    ROS    Objective:  Physical Exam   There were no vitals filed for this visit.  ***  CBC    Component Value Date/Time   WBC 6.8 06/10/2022 0930   RBC 4.54 06/10/2022 0930   HGB 12.7 06/10/2022 0930   HCT 38.4 06/10/2022 0930   PLT 321.0 06/10/2022 0930   MCV 84.5 06/10/2022 0930   MCH 28.5 01/08/2022 0632   MCHC 33.2 06/10/2022 0930   RDW 14.0 06/10/2022 0930   LYMPHSABS 1.2 06/10/2022 0930   MONOABS 0.4 06/10/2022 0930   EOSABS 0.2 06/10/2022 0930   BASOSABS 0.0 06/10/2022 0930     Chest imaging: May 2023 CT chest images independently reviewed showing normal pulmonary parenchyma and airways, normal cardiac  silhouette.  PFT:  Labs:  Path:  Echo:  Heart Catheterization:       Assessment & Plan:   No diagnosis found.  Discussion: ***  Immunizations: Immunization History  Administered Date(s) Administered   Influenza,inj,Quad PF,6+ Mos 07/09/2012, 08/22/2014, 07/16/2015, 07/23/2016   PPD Test 09/30/2011, 08/18/2012, 10/31/2013   Pneumococcal Polysaccharide-23 08/13/2015  Tdap 07/10/2011     Current Outpatient Medications:    albuterol (PROVENTIL) (2.5 MG/3ML) 0.083% nebulizer solution, Take 2.5 mg by nebulization as needed.  , Disp: , Rfl:    albuterol (VENTOLIN HFA) 108 (90 Base) MCG/ACT inhaler, Inhale 2 puffs into the lungs every 4 (four) hours as needed for wheezing or shortness of breath., Disp: , Rfl:    Calcium Carbonate-Vitamin D (CALCIUM 500 + D PO), Take 1 capsule by mouth daily.  , Disp: , Rfl:    dicyclomine (BENTYL) 20 MG tablet, TAKE 1 TABLET BY MOUTH 4 TIMES DAILY WITH MEALS AND AT BEDTIME (Patient taking differently: Take 20 mg by mouth 4 (four) times daily as needed for spasms.), Disp: 120 tablet, Rfl: 0   famotidine (PEPCID) 20 MG tablet, Take 1 tablet (20 mg total) by mouth 2 (two) times daily for 14 days., Disp: 28 tablet, Rfl: 0   ferrous sulfate 325 (65 FE) MG tablet, Take 325 mg by mouth daily with breakfast.  , Disp: , Rfl:    fexofenadine (ALLEGRA) 60 MG tablet, Take 1 tablet (60 mg total) by mouth 2 (two) times daily., Disp: 30 tablet, Rfl: 0   fluticasone (FLONASE) 50 MCG/ACT nasal spray, Place 2 sprays into both nostrils daily., Disp: , Rfl:    folic acid (FOLVITE) 1 MG tablet, Take 1 tablet (1 mg total) by mouth daily., Disp: 90 tablet, Rfl: 0   guaiFENesin (MUCINEX) 600 MG 12 hr tablet, Take 1 tablet (600 mg total) by mouth 2 (two) times daily., Disp: 60 tablet, Rfl: 2   mometasone-formoterol (DULERA) 200-5 MCG/ACT AERO, Inhale 2 puffs into the lungs 2 (two) times daily., Disp: , Rfl:    mupirocin ointment (BACTROBAN) 2 %, Apply 1 application.  topically 3 (three) times daily as needed., Disp: , Rfl:    nystatin ointment (MYCOSTATIN), Apply 1 application. topically 2 (two) times daily as needed (skin irriation)., Disp: , Rfl:    ondansetron (ZOFRAN) 4 MG tablet, Take 4 mg by mouth every 8 (eight) hours as needed for nausea. , Disp: , Rfl:    polyethylene glycol powder (GLYCOLAX/MIRALAX) powder, Take 17 g by mouth daily as needed for mild constipation., Disp: , Rfl:    rosuvastatin (CRESTOR) 10 MG tablet, Take 10 mg by mouth daily.  , Disp: , Rfl:    sulfaSALAzine (AZULFIDINE) 500 MG tablet, Take 1 tablet (500 mg total) by mouth in the morning and at bedtime., Disp: 60 tablet, Rfl: 3   venlafaxine XR (EFFEXOR-XR) 150 MG 24 hr capsule, Take 150 mg by mouth daily with breakfast., Disp: , Rfl:    vitamin B-12 (CYANOCOBALAMIN) 1000 MCG tablet, Take 2,000 mcg by mouth daily., Disp: , Rfl:

## 2022-06-16 ENCOUNTER — Other Ambulatory Visit: Payer: Self-pay | Admitting: Gastroenterology

## 2022-06-18 ENCOUNTER — Encounter: Payer: Self-pay | Admitting: Gastroenterology

## 2022-06-18 LAB — CALPROTECTIN, FECAL: Calprotectin, Fecal: 114 ug/g (ref 0–120)

## 2022-06-19 ENCOUNTER — Institutional Professional Consult (permissible substitution): Payer: Medicare Other | Admitting: Pulmonary Disease

## 2022-07-01 ENCOUNTER — Other Ambulatory Visit: Payer: Self-pay | Admitting: Gastroenterology

## 2022-07-06 ENCOUNTER — Other Ambulatory Visit: Payer: Self-pay | Admitting: Gastroenterology

## 2022-07-08 ENCOUNTER — Telehealth: Payer: Self-pay | Admitting: Gastroenterology

## 2022-07-08 NOTE — Telephone Encounter (Signed)
Dr. Silverio Decamp - please advise. Patient is requesting new scripts for mesalamine and sucralfate. I do not see that these meds were not discussed at last office visit.

## 2022-07-08 NOTE — Telephone Encounter (Signed)
Inbound call from patient stating that she needs a refill for Sucralfate and Mesalamine. I did not see either of those mediations on her list. Please advise.

## 2022-07-09 ENCOUNTER — Encounter: Payer: Self-pay | Admitting: Gastroenterology

## 2022-07-09 NOTE — Telephone Encounter (Signed)
Patient called to follow up on refill request.

## 2022-07-10 ENCOUNTER — Other Ambulatory Visit: Payer: Self-pay

## 2022-07-10 ENCOUNTER — Other Ambulatory Visit: Payer: Self-pay | Admitting: Gastroenterology

## 2022-07-10 ENCOUNTER — Institutional Professional Consult (permissible substitution): Payer: Medicare Other | Admitting: Pulmonary Disease

## 2022-07-10 MED ORDER — MESALAMINE 1.2 G PO TBEC: 4.8000 g | DELAYED_RELEASE_TABLET | Freq: Every day | ORAL | 11 refills | Status: DC

## 2022-07-10 MED ORDER — SUCRALFATE 1 G PO TABS: 1.0000 g | ORAL_TABLET | Freq: Two times a day (BID) | ORAL | 3 refills | Status: DC

## 2022-07-10 NOTE — Progress Notes (Deleted)
Synopsis: Referred in 06/2022 for Dyspnea, clearance for colonoscopy   Subjective:   PATIENT ID: Kaitlyn Jennings: female DOB: 1961-01-19, MRN: [id number]   HPI  No chief complaint on file.   ***  Record review: Records from her last GI visit reviewed where she was seen for ulcerative colitis.  She is due for screening colonoscopy so she was referred to [place] for pulmonary clearance with the reason being that she has been hospitalized several times in the last few years for breathing issues.  Specifically, 2021 she was hospitalized for stridor, required emergent intubation.  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  In addition in late April 2023 she was hospitalized again for stridor and upper airway compromise.  Fortunately this time she did not require intubation and she was treated with Decadron and racemic epinephrine and made a full recovery and was able to be discharged after 2 days.  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   Asthma    B12 deficiency    Chronic gastritis    GERD (gastroesophageal reflux disease)    Hyperlipidemia    IBS (irritable bowel syndrome)    Lupus (HCC)    secondary to Rmicade   Pancreatitis    Rectal polyp 10/01/2015   Ulcerative colitis      Family History  Problem Relation Age of Onset   Irritable bowel syndrome Father    COPD Father    Diabetes Maternal Grandfather    Colon cancer Neg Hx    Stomach cancer Neg Hx      Social History   Socioeconomic History   Marital status: Married    Spouse name: Not on file   Number of children: 0   Years of education: Not on file   Highest education level: Not on file  Occupational History   Occupation: FLIGHT ATTENDANT  Tobacco Use    Smoking status: Never   Smokeless tobacco: Never  Vaping Use   Vaping Use: Never used  Substance and Sexual Activity   Alcohol use: No    Alcohol/week: 0.0 standard drinks of alcohol   Drug use: No   Sexual activity: Yes    Partners: Male    Birth control/protection: Pill  Other Topics Concern   Not on file  Social History Narrative   Not on file   Social Determinants of Health   Financial Resource Strain: Not on file  Food Insecurity: Not on file  Transportation Needs: Not on file  Physical Activity: Not on file  Stress: Not on file  Social Connections: Not on file  Intimate Partner Violence: Not on file     Allergies  Allergen Reactions   Valsartan Swelling    Possible angioedema with stridor but improved with dexamethasone   Amoxicillin Other (See Comments)    Abdominal pain   Infliximab Other (See Comments)    "Lupus," joint pain and swelling      Molds & Smuts    Oseltamivir Nausea And Vomiting and Other (See Comments)    Also, stomach cramps      Outpatient Medications Prior to Visit  Medication Sig Dispense Refill   albuterol (PROVENTIL) (2.5 MG/3ML) 0.083% nebulizer solution Take 2.5 mg by nebulization as  needed.       albuterol (VENTOLIN HFA) 108 (90 Base) MCG/ACT inhaler Inhale 2 puffs into the lungs every 4 (four) hours as needed for wheezing or shortness of breath.     Calcium Carbonate-Vitamin D (CALCIUM 500 + D PO) Take 1 capsule by mouth daily.       dicyclomine (BENTYL) 20 MG tablet TAKE 1 TABLET BY MOUTH 4 TIMES DAILY WITH MEALS AND AT BEDTIME (Patient taking differently: Take 20 mg by mouth 4 (four) times daily as needed for spasms.) 120 tablet 0   famotidine (PEPCID) 20 MG tablet Take 1 tablet (20 mg total) by mouth 2 (two) times daily for 14 days. 28 tablet 0   ferrous sulfate 325 (65 FE) MG tablet Take 325 mg by mouth daily with breakfast.       fexofenadine (ALLEGRA) 60 MG tablet Take 1 tablet (60 mg total) by mouth 2 (two) times daily. 30  tablet 0   fluticasone (FLONASE) 50 MCG/ACT nasal spray Place 2 sprays into both nostrils daily.     folic acid (FOLVITE) 1 MG tablet Take 1 tablet (1 mg total) by mouth daily. 90 tablet 0   guaiFENesin (MUCINEX) 600 MG 12 hr tablet Take 1 tablet (600 mg total) by mouth 2 (two) times daily. 60 tablet 2   mometasone-formoterol (DULERA) 200-5 MCG/ACT AERO Inhale 2 puffs into the lungs 2 (two) times daily.     mupirocin ointment (BACTROBAN) 2 % Apply 1 application. topically 3 (three) times daily as needed.     nystatin ointment (MYCOSTATIN) Apply 1 application. topically 2 (two) times daily as needed (skin irriation).     ondansetron (ZOFRAN) 4 MG tablet Take 4 mg by mouth every 8 (eight) hours as needed for nausea.      polyethylene glycol powder (GLYCOLAX/MIRALAX) powder Take 17 g by mouth daily as needed for mild constipation.     rosuvastatin (CRESTOR) 10 MG tablet Take 10 mg by mouth daily.       sulfaSALAzine (AZULFIDINE) 500 MG tablet Take 1 tablet (500 mg total) by mouth in the morning and at bedtime. 60 tablet 3   venlafaxine XR (EFFEXOR-XR) 150 MG 24 hr capsule Take 150 mg by mouth daily with breakfast.     vitamin B-12 (CYANOCOBALAMIN) 1000 MCG tablet Take 2,000 mcg by mouth daily.     No facility-administered medications prior to visit.    ROS    Objective:  Physical Exam   There were no vitals filed for this visit.  ***  CBC    Component Value Date/Time   WBC 6.8 06/10/2022 0930   RBC 4.54 06/10/2022 0930   HGB 12.7 06/10/2022 0930   HCT 38.4 06/10/2022 0930   PLT 321.0 06/10/2022 0930   MCV 84.5 06/10/2022 0930   MCH 28.5 01/08/2022 0632   MCHC 33.2 06/10/2022 0930   RDW 14.0 06/10/2022 0930   LYMPHSABS 1.2 06/10/2022 0930   MONOABS 0.4 06/10/2022 0930   EOSABS 0.2 06/10/2022 0930   BASOSABS 0.0 06/10/2022 0930     Chest imaging: May 2023 CT chest images independently reviewed showing normal pulmonary parenchyma and airways, normal cardiac  silhouette.  PFT:  Labs:  Path:  Echo:  Heart Catheterization:       Assessment & Plan:   No diagnosis found.  Discussion: ***  Immunizations: Immunization History  Administered Date(s) Administered   Influenza,inj,Quad PF,6+ Mos 07/09/2012, 08/22/2014, 07/16/2015, 07/23/2016   PPD Test 09/30/2011, 08/18/2012, 10/31/2013   Pneumococcal Polysaccharide-23 08/13/2015  Tdap 07/10/2011     Current Outpatient Medications:    albuterol (PROVENTIL) (2.5 MG/3ML) 0.083% nebulizer solution, Take 2.5 mg by nebulization as needed.  , Disp: , Rfl:    albuterol (VENTOLIN HFA) 108 (90 Base) MCG/ACT inhaler, Inhale 2 puffs into the lungs every 4 (four) hours as needed for wheezing or shortness of breath., Disp: , Rfl:    Calcium Carbonate-Vitamin D (CALCIUM 500 + D PO), Take 1 capsule by mouth daily.  , Disp: , Rfl:    dicyclomine (BENTYL) 20 MG tablet, TAKE 1 TABLET BY MOUTH 4 TIMES DAILY WITH MEALS AND AT BEDTIME (Patient taking differently: Take 20 mg by mouth 4 (four) times daily as needed for spasms.), Disp: 120 tablet, Rfl: 0   famotidine (PEPCID) 20 MG tablet, Take 1 tablet (20 mg total) by mouth 2 (two) times daily for 14 days., Disp: 28 tablet, Rfl: 0   ferrous sulfate 325 (65 FE) MG tablet, Take 325 mg by mouth daily with breakfast.  , Disp: , Rfl:    fexofenadine (ALLEGRA) 60 MG tablet, Take 1 tablet (60 mg total) by mouth 2 (two) times daily., Disp: 30 tablet, Rfl: 0   fluticasone (FLONASE) 50 MCG/ACT nasal spray, Place 2 sprays into both nostrils daily., Disp: , Rfl:    folic acid (FOLVITE) 1 MG tablet, Take 1 tablet (1 mg total) by mouth daily., Disp: 90 tablet, Rfl: 0   guaiFENesin (MUCINEX) 600 MG 12 hr tablet, Take 1 tablet (600 mg total) by mouth 2 (two) times daily., Disp: 60 tablet, Rfl: 2   mometasone-formoterol (DULERA) 200-5 MCG/ACT AERO, Inhale 2 puffs into the lungs 2 (two) times daily., Disp: , Rfl:    mupirocin ointment (BACTROBAN) 2 %, Apply 1 application.  topically 3 (three) times daily as needed., Disp: , Rfl:    nystatin ointment (MYCOSTATIN), Apply 1 application. topically 2 (two) times daily as needed (skin irriation)., Disp: , Rfl:    ondansetron (ZOFRAN) 4 MG tablet, Take 4 mg by mouth every 8 (eight) hours as needed for nausea. , Disp: , Rfl:    polyethylene glycol powder (GLYCOLAX/MIRALAX) powder, Take 17 g by mouth daily as needed for mild constipation., Disp: , Rfl:    rosuvastatin (CRESTOR) 10 MG tablet, Take 10 mg by mouth daily.  , Disp: , Rfl:    sulfaSALAzine (AZULFIDINE) 500 MG tablet, Take 1 tablet (500 mg total) by mouth in the morning and at bedtime., Disp: 60 tablet, Rfl: 3   venlafaxine XR (EFFEXOR-XR) 150 MG 24 hr capsule, Take 150 mg by mouth daily with breakfast., Disp: , Rfl:    vitamin B-12 (CYANOCOBALAMIN) 1000 MCG tablet, Take 2,000 mcg by mouth daily., Disp: , Rfl:

## 2022-07-10 NOTE — Telephone Encounter (Signed)
Medications refilled

## 2022-07-10 NOTE — Telephone Encounter (Signed)
Patient called asking for you to give her a call back regarding message below.

## 2022-07-10 NOTE — Telephone Encounter (Signed)
Please send Rx for Measlamine (Lialda ) 4.8gm daily X 30 with 11 refills and Sucralfate 1gm BID X 60 tabs with 3 refills. Thanks

## 2022-07-17 ENCOUNTER — Institutional Professional Consult (permissible substitution): Payer: Medicare Other | Admitting: Pulmonary Disease

## 2022-08-04 ENCOUNTER — Encounter: Payer: Self-pay | Admitting: Gastroenterology

## 2022-08-04 NOTE — Telephone Encounter (Signed)
Beth, can you please send Rx for Tramadol 33m Q8h prn X 7 days. She will need office visit for evaluation if is having persistent pain

## 2022-08-05 ENCOUNTER — Other Ambulatory Visit: Payer: Self-pay

## 2022-08-05 ENCOUNTER — Institutional Professional Consult (permissible substitution): Payer: Medicare Other | Admitting: Pulmonary Disease

## 2022-08-05 MED ORDER — TRAMADOL HCL 50 MG PO TABS: 50.0000 mg | ORAL_TABLET | Freq: Three times a day (TID) | ORAL | 0 refills | Status: AC | PRN

## 2022-08-12 NOTE — Progress Notes (Unsigned)
Synopsis: Has been seen by [facility] several times during different hospitalizations.  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  Has been evaluated by Dr. Lorelee Cover for the same.  Also felt to have asthma.  Most recently hospitalized and September 2023 in the context of shortness of breath and stridor.  Managed without endotracheal intubation or tracheostomy during that visit.  Subjective:   PATIENT ID: Kaitlyn Jennings GENDER: female DOB: Apr 23, 1961, MRN: [id number]   HPI  No chief complaint on file.   *** Record review: May 2023 Hospital records reviewed where the patient was treated for stridor with steroids, no particular etiology identified, no surgery needed.  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   Asthma    B12 deficiency    Chronic gastritis    GERD (gastroesophageal reflux disease)    Hyperlipidemia    IBS (irritable bowel syndrome)    Lupus (HCC)    secondary to Rmicade   Pancreatitis    Rectal polyp 10/01/2015   Ulcerative colitis      Family History  Problem Relation Age of Onset   Irritable bowel syndrome Father    COPD Father    Diabetes Maternal Grandfather    Colon cancer Neg Hx    Stomach cancer Neg Hx      Social History   Socioeconomic History   Marital status: Married    Spouse name: Not on file   Number of children: 0   Years of education: Not on file   Highest education level: Not on file  Occupational History   Occupation: FLIGHT ATTENDANT  Tobacco Use   Smoking status: Never   Smokeless tobacco: Never  Vaping Use   Vaping Use: Never used  Substance and Sexual Activity   Alcohol use: No    Alcohol/week: 0.0 standard drinks of alcohol   Drug use: No   Sexual activity: Yes    Partners: Male    Birth control/protection: Pill  Other Topics Concern   Not on file  Social History Narrative   Not on file   Social Determinants  of Health   Financial Resource Strain: Not on file  Food Insecurity: Not on file  Transportation Needs: Not on file  Physical Activity: Not on file  Stress: Not on file  Social Connections: Not on file  Intimate Partner Violence: Not on file     Allergies  Allergen Reactions   Valsartan Swelling    Possible angioedema with stridor but improved with dexamethasone   Amoxicillin Other (See Comments)    Abdominal pain   Infliximab Other (See Comments)    "Lupus," joint pain and swelling      Molds & Smuts    Oseltamivir Nausea And Vomiting and Other (See Comments)    Also, stomach cramps      Outpatient Medications Prior to Visit  Medication Sig Dispense Refill   traMADol (ULTRAM) 50 MG tablet Take 1 tablet (50 mg total) by mouth every 8 (eight) hours as needed for up to 7 days. 21 tablet 0   albuterol (PROVENTIL) (2.5 MG/3ML) 0.083% nebulizer solution Take 2.5 mg by nebulization as needed.       albuterol (VENTOLIN HFA) 108 (90 Base) MCG/ACT inhaler Inhale 2 puffs into the lungs every 4 (four) hours as needed for wheezing or shortness of breath.     Calcium Carbonate-Vitamin D (CALCIUM  500 + D PO) Take 1 capsule by mouth daily.       dicyclomine (BENTYL) 20 MG tablet TAKE 1 TABLET BY MOUTH 4 TIMES DAILY WITH MEALS AND AT BEDTIME 120 tablet 0   famotidine (PEPCID) 20 MG tablet Take 1 tablet (20 mg total) by mouth 2 (two) times daily for 14 days. 28 tablet 0   ferrous sulfate 325 (65 FE) MG tablet Take 325 mg by mouth daily with breakfast.       fexofenadine (ALLEGRA) 60 MG tablet Take 1 tablet (60 mg total) by mouth 2 (two) times daily. 30 tablet 0   fluticasone (FLONASE) 50 MCG/ACT nasal spray Place 2 sprays into both nostrils daily.     folic acid (FOLVITE) 1 MG tablet Take 1 tablet (1 mg total) by mouth daily. 90 tablet 0   guaiFENesin (MUCINEX) 600 MG 12 hr tablet Take 1 tablet (600 mg total) by mouth 2 (two) times daily. 60 tablet 2   mesalamine (LIALDA) 1.2 g EC tablet Take  4 tablets (4.8 g total) by mouth daily with breakfast. 120 tablet 11   mometasone-formoterol (DULERA) 200-5 MCG/ACT AERO Inhale 2 puffs into the lungs 2 (two) times daily.     mupirocin ointment (BACTROBAN) 2 % Apply 1 application. topically 3 (three) times daily as needed.     nystatin ointment (MYCOSTATIN) Apply 1 application. topically 2 (two) times daily as needed (skin irriation).     ondansetron (ZOFRAN) 4 MG tablet Take 4 mg by mouth every 8 (eight) hours as needed for nausea.      polyethylene glycol powder (GLYCOLAX/MIRALAX) powder Take 17 g by mouth daily as needed for mild constipation.     rosuvastatin (CRESTOR) 10 MG tablet Take 10 mg by mouth daily.       sucralfate (CARAFATE) 1 g tablet Take 1 tablet (1 g total) by mouth 2 (two) times daily. 60 tablet 3   sulfaSALAzine (AZULFIDINE) 500 MG tablet Take 1 tablet (500 mg total) by mouth in the morning and at bedtime. 60 tablet 3   venlafaxine XR (EFFEXOR-XR) 150 MG 24 hr capsule Take 150 mg by mouth daily with breakfast.     vitamin B-12 (CYANOCOBALAMIN) 1000 MCG tablet Take 2,000 mcg by mouth daily.     No facility-administered medications prior to visit.    ROS    Objective:  Physical Exam   There were no vitals filed for this visit.  ***  CBC    Component Value Date/Time   WBC 6.8 06/10/2022 0930   RBC 4.54 06/10/2022 0930   HGB 12.7 06/10/2022 0930   HCT 38.4 06/10/2022 0930   PLT 321.0 06/10/2022 0930   MCV 84.5 06/10/2022 0930   MCH 28.5 01/08/2022 0632   MCHC 33.2 06/10/2022 0930   RDW 14.0 06/10/2022 0930   LYMPHSABS 1.2 06/10/2022 0930   MONOABS 0.4 06/10/2022 0930   EOSABS 0.2 06/10/2022 0930   BASOSABS 0.0 06/10/2022 0930     Chest imaging: May 2023 CT chest showed "no acute intrathoracic process"  PFT:  Labs:  Path:  Echo:  Heart Catheterization:       Assessment & Plan:   No diagnosis found.  Discussion: ***  Immunizations: Immunization History  Administered Date(s)  Administered   Influenza,inj,Quad PF,6+ Mos 07/09/2012, 08/22/2014, 07/16/2015, 07/23/2016   PPD Test 09/30/2011, 08/18/2012, 10/31/2013   Pneumococcal Polysaccharide-23 08/13/2015   Tdap 07/10/2011     Current Outpatient Medications:    traMADol (ULTRAM) 50 MG tablet, Take 1 tablet (  50 mg total) by mouth every 8 (eight) hours as needed for up to 7 days., Disp: 21 tablet, Rfl: 0   albuterol (PROVENTIL) (2.5 MG/3ML) 0.083% nebulizer solution, Take 2.5 mg by nebulization as needed.  , Disp: , Rfl:    albuterol (VENTOLIN HFA) 108 (90 Base) MCG/ACT inhaler, Inhale 2 puffs into the lungs every 4 (four) hours as needed for wheezing or shortness of breath., Disp: , Rfl:    Calcium Carbonate-Vitamin D (CALCIUM 500 + D PO), Take 1 capsule by mouth daily.  , Disp: , Rfl:    dicyclomine (BENTYL) 20 MG tablet, TAKE 1 TABLET BY MOUTH 4 TIMES DAILY WITH MEALS AND AT BEDTIME, Disp: 120 tablet, Rfl: 0   famotidine (PEPCID) 20 MG tablet, Take 1 tablet (20 mg total) by mouth 2 (two) times daily for 14 days., Disp: 28 tablet, Rfl: 0   ferrous sulfate 325 (65 FE) MG tablet, Take 325 mg by mouth daily with breakfast.  , Disp: , Rfl:    fexofenadine (ALLEGRA) 60 MG tablet, Take 1 tablet (60 mg total) by mouth 2 (two) times daily., Disp: 30 tablet, Rfl: 0   fluticasone (FLONASE) 50 MCG/ACT nasal spray, Place 2 sprays into both nostrils daily., Disp: , Rfl:    folic acid (FOLVITE) 1 MG tablet, Take 1 tablet (1 mg total) by mouth daily., Disp: 90 tablet, Rfl: 0   guaiFENesin (MUCINEX) 600 MG 12 hr tablet, Take 1 tablet (600 mg total) by mouth 2 (two) times daily., Disp: 60 tablet, Rfl: 2   mesalamine (LIALDA) 1.2 g EC tablet, Take 4 tablets (4.8 g total) by mouth daily with breakfast., Disp: 120 tablet, Rfl: 11   mometasone-formoterol (DULERA) 200-5 MCG/ACT AERO, Inhale 2 puffs into the lungs 2 (two) times daily., Disp: , Rfl:    mupirocin ointment (BACTROBAN) 2 %, Apply 1 application. topically 3 (three) times daily  as needed., Disp: , Rfl:    nystatin ointment (MYCOSTATIN), Apply 1 application. topically 2 (two) times daily as needed (skin irriation)., Disp: , Rfl:    ondansetron (ZOFRAN) 4 MG tablet, Take 4 mg by mouth every 8 (eight) hours as needed for nausea. , Disp: , Rfl:    polyethylene glycol powder (GLYCOLAX/MIRALAX) powder, Take 17 g by mouth daily as needed for mild constipation., Disp: , Rfl:    rosuvastatin (CRESTOR) 10 MG tablet, Take 10 mg by mouth daily.  , Disp: , Rfl:    sucralfate (CARAFATE) 1 g tablet, Take 1 tablet (1 g total) by mouth 2 (two) times daily., Disp: 60 tablet, Rfl: 3   sulfaSALAzine (AZULFIDINE) 500 MG tablet, Take 1 tablet (500 mg total) by mouth in the morning and at bedtime., Disp: 60 tablet, Rfl: 3   venlafaxine XR (EFFEXOR-XR) 150 MG 24 hr capsule, Take 150 mg by mouth daily with breakfast., Disp: , Rfl:    vitamin B-12 (CYANOCOBALAMIN) 1000 MCG tablet, Take 2,000 mcg by mouth daily., Disp: , Rfl:

## 2022-08-14 ENCOUNTER — Encounter: Payer: Self-pay | Admitting: Pulmonary Disease

## 2022-08-14 ENCOUNTER — Ambulatory Visit: Payer: Medicare Other | Admitting: Pulmonary Disease

## 2022-08-14 VITALS — BP 150/100 | HR 94 | Temp 98.8°F | Ht 69.0 in | Wt 260.4 lb

## 2022-08-14 DIAGNOSIS — J386 Stenosis of larynx: Secondary | ICD-10-CM

## 2022-08-14 DIAGNOSIS — R0609 Other forms of dyspnea: Secondary | ICD-10-CM

## 2022-08-14 DIAGNOSIS — J309 Allergic rhinitis, unspecified: Secondary | ICD-10-CM | POA: Diagnosis not present

## 2022-08-14 DIAGNOSIS — R062 Wheezing: Secondary | ICD-10-CM | POA: Diagnosis not present

## 2022-08-14 NOTE — Patient Instructions (Signed)
Shortness of breath with wheezing: Lung function testing Check oxygen while walking Continue Dulera for now Continue albuterol as needed for now  Allergic rhinitis: Continue Flonase, 2 sprays each nostril daily  History of subglottic stenosis: Please call Dr. Benjamine Mola for a follow-up appointment as I think it is helpful for you to have another laryngoscopy  At this time I do not recommend having an endoscopy until we can complete lung function testing and you have the laryngoscopy procedure  We will see you back in 4 to 6 weeks to go over the results of these tests.

## 2022-08-14 NOTE — Addendum Note (Signed)
Addended by: Loma Sousa on: 08/14/2022 10:23 AM   Modules accepted: Orders

## 2022-08-19 ENCOUNTER — Encounter: Payer: Self-pay | Admitting: Gastroenterology

## 2022-08-19 ENCOUNTER — Encounter: Payer: Self-pay | Admitting: Pulmonary Disease

## 2022-08-28 ENCOUNTER — Other Ambulatory Visit: Payer: Self-pay | Admitting: Gastroenterology

## 2022-08-28 ENCOUNTER — Other Ambulatory Visit (INDEPENDENT_AMBULATORY_CARE_PROVIDER_SITE_OTHER): Payer: Medicare Other

## 2022-08-28 ENCOUNTER — Ambulatory Visit: Payer: Medicare Other | Admitting: Gastroenterology

## 2022-08-28 ENCOUNTER — Encounter: Payer: Self-pay | Admitting: Gastroenterology

## 2022-08-28 VITALS — BP 128/82 | HR 103 | Ht 69.0 in | Wt 260.8 lb

## 2022-08-28 DIAGNOSIS — K5190 Ulcerative colitis, unspecified, without complications: Secondary | ICD-10-CM

## 2022-08-28 LAB — CBC WITH DIFFERENTIAL/PLATELET
Basophils Absolute: 0.1 10*3/uL (ref 0.0–0.1)
Basophils Relative: 0.6 % (ref 0.0–3.0)
Eosinophils Absolute: 0.3 10*3/uL (ref 0.0–0.7)
Eosinophils Relative: 3.4 % (ref 0.0–5.0)
HCT: 40.6 % (ref 36.0–46.0)
Hemoglobin: 13.5 g/dL (ref 12.0–15.0)
Lymphocytes Relative: 20.7 % (ref 12.0–46.0)
Lymphs Abs: 1.7 10*3/uL (ref 0.7–4.0)
MCHC: 33.2 g/dL (ref 30.0–36.0)
MCV: 85.7 fl (ref 78.0–100.0)
Monocytes Absolute: 0.5 10*3/uL (ref 0.1–1.0)
Monocytes Relative: 6.6 % (ref 3.0–12.0)
Neutro Abs: 5.7 10*3/uL (ref 1.4–7.7)
Neutrophils Relative %: 68.7 % (ref 43.0–77.0)
Platelets: 367 10*3/uL (ref 150.0–400.0)
RBC: 4.73 Mil/uL (ref 3.87–5.11)
RDW: 14.3 % (ref 11.5–15.5)
WBC: 8.3 10*3/uL (ref 4.0–10.5)

## 2022-08-28 LAB — COMPREHENSIVE METABOLIC PANEL
ALT: 37 U/L — ABNORMAL HIGH (ref 0–35)
AST: 17 U/L (ref 0–37)
Albumin: 4.5 g/dL (ref 3.5–5.2)
Alkaline Phosphatase: 116 U/L (ref 39–117)
BUN: 12 mg/dL (ref 6–23)
CO2: 22 mEq/L (ref 19–32)
Calcium: 9.8 mg/dL (ref 8.4–10.5)
Chloride: 106 mEq/L (ref 96–112)
Creatinine, Ser: 0.7 mg/dL (ref 0.40–1.20)
GFR: 92.91 mL/min (ref >60.00)
Glucose, Bld: 114 mg/dL — ABNORMAL HIGH (ref 70–99)
Potassium: 3.5 mEq/L (ref 3.5–5.1)
Sodium: 142 mEq/L (ref 135–145)
Total Bilirubin: 0.3 mg/dL (ref 0.2–1.2)
Total Protein: 7.3 g/dL (ref 6.0–8.3)

## 2022-08-28 LAB — SEDIMENTATION RATE: Sed Rate: 38 mm/hr — ABNORMAL HIGH (ref 0–30)

## 2022-08-28 LAB — HIGH SENSITIVITY CRP: CRP, High Sensitivity: 3.25 mg/L (ref 0.000–5.000)

## 2022-08-28 MED ORDER — PLENVU 140 G PO SOLR: 1.0000 | ORAL | 0 refills | Status: DC

## 2022-08-28 MED ORDER — MERCAPTOPURINE 50 MG PO TABS: 50.0000 mg | ORAL_TABLET | Freq: Every day | ORAL | 3 refills | Status: DC

## 2022-08-28 NOTE — Progress Notes (Signed)
Kaitlyn Jennings    [id number]    [birth year]  Primary Care Physician:Beane, Mirian Mo, PA  Referring Physician: Manfred Shirts, [address] [place],  [place] [address]   Chief complaint:  uc  HPI:  61 year old very pleasant female here for follow-up visit for ulcerative colitis, last office visit in January 2021.  She was lost to follow-up.  Patient mentions that overall her GI symptoms are currently under good control.  She has on average 2-3 formed bowel movements per day, no blood in stool Intermittent abd pain when she was taken off sulfasalazine during hospitalization but is better since she got back on it. Hospitalized in May 2023 with acute respiratory distress, was treated with steroid taper.  She continues to have intermittent stridor and wheezing.  She has not followed up with pulmonary posthospitalization.  She has significant shortness of breath with any activity or exertion     GI history: Ulcerative colitis initially diagnosed in 1998 Allergic reaction to Remicade, was maintained on Humira with loss of clinical response 2016 (elevated antibody level with undetectable drug trough), switched to [facility] May 2017, was discontinued by patient due to lack of adequate insurance coverage with high out-of-pocket deductible.  Subsequently switched to [facility] in 2018, but she has missed multiple infusions and was getting it erratically. Morrie Sheldon had higher out-of-pocket deductible, patient did not want to start it.     Colonoscopy December 25, 2016: Moderately active proctitis, 12 mm polyp removed (sessile serrated adenoma)   Outpatient Encounter Medications as of 08/28/2022  Medication Sig   albuterol (PROVENTIL) (2.5 MG/3ML) 0.083% nebulizer solution Take 2.5 mg by nebulization as needed.     albuterol (VENTOLIN HFA) 108 (90 Base) MCG/ACT inhaler Inhale 2 puffs into the lungs every 4 (four) hours as needed for wheezing or shortness of breath.   Calcium Carbonate-Vitamin D  (CALCIUM 500 + D PO) Take 1 capsule by mouth daily.     dicyclomine (BENTYL) 20 MG tablet TAKE 1 TABLET BY MOUTH 4 TIMES DAILY WITH MEALS AND AT BEDTIME   ferrous sulfate 325 (65 FE) MG tablet Take 325 mg by mouth daily with breakfast.     fexofenadine (ALLEGRA) 60 MG tablet Take 1 tablet (60 mg total) by mouth 2 (two) times daily.   fluticasone (FLONASE) 50 MCG/ACT nasal spray Place 2 sprays into both nostrils daily.   folic acid (FOLVITE) 1 MG tablet Take 1 tablet (1 mg total) by mouth daily.   guaiFENesin (MUCINEX) 600 MG 12 hr tablet Take 1 tablet (600 mg total) by mouth 2 (two) times daily.   mesalamine (LIALDA) 1.2 g EC tablet Take 4 tablets (4.8 g total) by mouth daily with breakfast.   mometasone-formoterol (DULERA) 200-5 MCG/ACT AERO Inhale 2 puffs into the lungs 2 (two) times daily.   mupirocin ointment (BACTROBAN) 2 % Apply 1 application. topically 3 (three) times daily as needed.   nystatin ointment (MYCOSTATIN) Apply 1 application. topically 2 (two) times daily as needed (skin irriation).   ondansetron (ZOFRAN) 4 MG tablet Take 4 mg by mouth every 8 (eight) hours as needed for nausea.    polyethylene glycol powder (GLYCOLAX/MIRALAX) powder Take 17 g by mouth daily as needed for mild constipation.   rosuvastatin (CRESTOR) 10 MG tablet Take 10 mg by mouth daily.     sucralfate (CARAFATE) 1 g tablet Take 1 tablet (1 g total) by mouth 2 (two) times daily.   sulfaSALAzine (AZULFIDINE)  500 MG tablet Take 1 tablet (500 mg total) by mouth in the morning and at bedtime.   venlafaxine XR (EFFEXOR-XR) 150 MG 24 hr capsule Take 150 mg by mouth daily with breakfast.   vitamin B-12 (CYANOCOBALAMIN) 1000 MCG tablet Take 2,000 mcg by mouth daily.   famotidine (PEPCID) 20 MG tablet Take 1 tablet (20 mg total) by mouth 2 (two) times daily for 14 days.   No facility-administered encounter medications on file as of 08/28/2022.    Allergies as of 08/28/2022 - Review Complete 08/28/2022  Allergen  Reaction Noted   Valsartan Swelling 01/07/2022   Amoxicillin Other (See Comments) 08/08/2014   Infliximab Other (See Comments) 08/26/2006   Molds & smuts  02/09/2013   Oseltamivir Nausea And Vomiting and Other (See Comments) 10/09/2016    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   Asthma    B12 deficiency    Chronic gastritis    GERD (gastroesophageal reflux disease)    Hyperlipidemia    IBS (irritable bowel syndrome)    Lupus (HCC)    secondary to Rmicade   Pancreatitis    Rectal polyp 10/01/2015   Ulcerative colitis     Past Surgical History:  Procedure Laterality Date   BREAST CYST ASPIRATION     left   ESOPHAGUS SURGERY     SINUS SURGERY WITH INSTATRAK      Family History  Problem Relation Age of Onset   Irritable bowel syndrome Father    COPD Father    Diabetes Maternal Grandfather    Colon cancer Neg Hx    Stomach cancer Neg Hx     Social History   Socioeconomic History   Marital status: Married    Spouse name: Not on file   Number of children: 0   Years of education: Not on file   Highest education level: Not on file  Occupational History   Occupation: FLIGHT ATTENDANT  Tobacco Use   Smoking status: Never   Smokeless tobacco: Never  Vaping Use   Vaping Use: Never used  Substance and Sexual Activity   Alcohol use: No    Alcohol/week: 0.0 standard drinks of alcohol   Drug use: No   Sexual activity: Yes    Partners: Male    Birth control/protection: Pill  Other Topics Concern   Not on file  Social History Narrative   Not on file   Social Determinants of Health   Financial Resource Strain: Not on file  Food Insecurity: Not on file  Transportation Needs: Not on file  Physical Activity: Not on file  Stress: Not on file  Social Connections: Not on file  Intimate Partner Violence: Not on file      Review of systems: All other review of systems negative except as mentioned in the  HPI.   Physical Exam: Vitals:   08/28/22 0850  BP: 128/82  Pulse: (Abnormal) 103  SpO2: 97%   Body mass index is 38.51 kg/m. 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 We will request optimization of her pulmonary status and clearance prior to scheduling colonoscopy    Return in 2 months or sooner if needed  This visit required *** minutes of patient care (this includes precharting, chart review, review of results, face-to-face time used for counseling as well as treatment plan and follow-up. The patient was provided an opportunity to ask questions and all were answered. The patient agreed with the plan and demonstrated an understanding of the instructions.  Damaris Hippo , MD    CC: Manfred Shirts, PA

## 2022-08-28 NOTE — Patient Instructions (Addendum)
Discontinue sulfasalazine.  _______________________________________________________  Continue mesalamine 4.8 grams daily.  _______________________________________________________  Continue mercaptopurine (96m) 50 mg daily   Please keep your appointment with the pulmonologist next week. _______________________________________________________  You have been scheduled for a colonoscopy. Please follow written instructions given to you at your visit today.  Please pick up your prep supplies at the pharmacy within the next 1-3 days. If you use inhalers (even only as needed), please bring them with you on the day of your procedure. _________________________________________________________  Your provider has requested that you go to the basement level for lab work before leaving today. Press "B" on the elevator. The lab is located at the first door on the left as you exit the elevator.  _______________________________________________________  Follow up with Dr NSilverio Decampin 3 months.  ________________________________________________________  Make sure to get RSV vaccine. ________________________________________________________  If you are age 2521or older, your body mass index should be between 23-30. Your Body mass index is 38.51 kg/m. If this is out of the aforementioned range listed, please consider follow up with your Primary Care Provider.  If you are age 2561or younger, your body mass index should be between 19-25. Your Body mass index is 38.51 kg/m. If this is out of the aformentioned range listed, please consider follow up with your Primary Care Provider.   ________________________________________________________  The [facility] providers would like to encourage you to use [facility] communicate with providers for non-urgent requests or questions.  Due to long hold times on the telephone, sending your provider a message by [facility] be a faster and more efficient way to get a  response.  Please allow 48 business hours for a response.  Please remember that this is for non-urgent requests.  _______________________________________________________  Due to recent changes in healthcare laws, you may see the results of your imaging and laboratory studies on MyChart before your provider has had a chance to review them.  We understand that in some cases there may be results that are confusing or concerning to you. Not all laboratory results come back in the same time frame and the provider may be waiting for multiple results in order to interpret others.  Please give [id number] hours in order for your provider to thoroughly review all the results before contacting the office for clarification of your results.

## 2022-08-29 ENCOUNTER — Encounter: Payer: Self-pay | Admitting: Gastroenterology

## 2022-08-29 ENCOUNTER — Encounter: Payer: Self-pay | Admitting: Pulmonary Disease

## 2022-08-29 MED ORDER — PREDNISONE 10 MG PO TABS: ORAL_TABLET | ORAL | 0 refills | Status: AC

## 2022-08-29 MED ORDER — AZITHROMYCIN 250 MG PO TABS: 250.0000 mg | ORAL_TABLET | Freq: Every day | ORAL | 0 refills | Status: DC

## 2022-08-29 NOTE — Telephone Encounter (Signed)
Difficult to fully evaluate this without an OV. Ok to treat her with azithro and pred, BUT she needs to call us if not improving, seek Urgent care if she worsens  Azithro > z-pack Pred  > Take 31m daily for 3 days, then 3107mdaily for 3 days, then 2079maily for 3 days, then 20m36mily for 3 days, then stop

## 2022-08-30 ENCOUNTER — Encounter: Payer: Self-pay | Admitting: Gastroenterology

## 2022-08-30 LAB — QUANTIFERON-TB GOLD PLUS
Mitogen-NIL: >10 IU/mL
NIL: 0.02 IU/mL
QuantiFERON-TB Gold Plus: NEGATIVE
TB1-NIL: 0 IU/mL
TB2-NIL: 0 IU/mL

## 2022-09-02 ENCOUNTER — Encounter: Payer: Self-pay | Admitting: Gastroenterology

## 2022-09-03 ENCOUNTER — Other Ambulatory Visit: Payer: Self-pay

## 2022-09-03 ENCOUNTER — Telehealth: Payer: Self-pay | Admitting: Pulmonary Disease

## 2022-09-03 MED ORDER — PEG 3350-KCL-NA BICARB-NACL 420 G PO SOLR: ORAL | 0 refills | Status: DC

## 2022-09-03 NOTE — Telephone Encounter (Signed)
Patient called to inform the doctor and nurse that she is supposed to have a PFT on tomorrow but she stated she is sick and is on Prednisone.  She's not sure if she should reschedule appt. Or come in anyway.  Please advise and call patient to let her know.  CB# [phone]

## 2022-09-03 NOTE — Telephone Encounter (Signed)
Pt called the office back. Stated she took a covid test which came back negative.  Pt is wanting to know if she needs to keep her PFT and OV after which is tomorrow 12/28. Dr. Lake Bells, please advise on this for pt.

## 2022-09-03 NOTE — Telephone Encounter (Signed)
Called and spoke with pt letting her know that BQ said to reschedule appts and she verbalized understanding. Appts have been rescheduled. Nothing further needed.

## 2022-09-03 NOTE — Telephone Encounter (Signed)
Called and spoke with patient, she states that she has had cough, HA, diarrhea and sinus problems since Saturday (12/23).  She denies any fever, chills, body aches, loss of taste or smell.  She was wheezing and sounded like she had some laryngitis.  She spoke with her PCP office and they sent in a zpack and prednisone.  She has 4 more doses of prednisone.  She did not have the diarrhea prior to starting the zpack, she also has IBS.  She wanted to know if she still needed to come in tomorrow and do the PFT and have the OV with Dr. Lake Bells.  I asked if she had tested for Covid and she stated no, she did not feel like she has covid.  I advised her to take a home test just so we can rule that out as we would treat her differently if she has covid.  She said she will get her husband to get her a home test and take it.  He was out at the time of my call and it may be a while before he is home.  I advised she can always take the test and call us if it is positive.  I advised her it would probably be a good idea to reschedule the PFT, however, I would send a message to Dr. Lake Bells.  She verbalized uderstanding.  Dr. Lake Bells, please advise if patient should reschedule PFT and OV for tomorrow.  Thank you.

## 2022-09-04 ENCOUNTER — Encounter: Payer: Self-pay | Admitting: Gastroenterology

## 2022-09-09 ENCOUNTER — Encounter: Payer: Self-pay | Admitting: Gastroenterology

## 2022-09-15 ENCOUNTER — Ambulatory Visit: Payer: Medicare Other | Admitting: Pulmonary Disease

## 2022-09-18 ENCOUNTER — Telehealth: Payer: Self-pay | Admitting: *Deleted

## 2022-09-18 NOTE — Telephone Encounter (Signed)
[facility] Pre-operative Risk Assessment     Request for surgical clearance:     Endoscopy Procedure  What type of surgery is being performed?     colonoscopy  When is this surgery scheduled?     10/22/2022  What type of clearance is required ?   Pharmacy  Are there any medications that need to be held prior to surgery and how long? Just a Pulmonary Clearance   Practice name and name of physician performing surgery?      [facility]  What is your office phone and fax number?      Phone- [phone]  [phone]  Anesthesia type (None, local, MAC, general) ?       MAC

## 2022-09-23 NOTE — Telephone Encounter (Signed)
Dr Silverio Decamp, Are we going to cancel patients procedure?

## 2022-09-24 NOTE — Telephone Encounter (Signed)
Yes please cancel appointment, we will reschedule once pulmonary work up is complete

## 2022-09-24 NOTE — Telephone Encounter (Signed)
Left message for patient that we are going to cancel her procedure for 2/14 until she gets all pulmonary testing done. And she is cleared. We will rescheduled after cleared by pulmonary

## 2022-09-29 ENCOUNTER — Other Ambulatory Visit: Payer: Self-pay | Admitting: Gastroenterology

## 2022-10-03 ENCOUNTER — Encounter: Payer: Self-pay | Admitting: Pulmonary Disease

## 2022-10-03 ENCOUNTER — Ambulatory Visit: Payer: Medicare Other | Admitting: Pulmonary Disease

## 2022-10-03 VITALS — BP 126/74 | HR 91 | Temp 98.5°F | Ht 69.0 in | Wt 253.0 lb

## 2022-10-03 DIAGNOSIS — R0609 Other forms of dyspnea: Secondary | ICD-10-CM | POA: Diagnosis not present

## 2022-10-03 DIAGNOSIS — R062 Wheezing: Secondary | ICD-10-CM

## 2022-10-03 DIAGNOSIS — J309 Allergic rhinitis, unspecified: Secondary | ICD-10-CM | POA: Diagnosis not present

## 2022-10-03 DIAGNOSIS — J383 Other diseases of vocal cords: Secondary | ICD-10-CM | POA: Diagnosis not present

## 2022-10-03 LAB — PULMONARY FUNCTION TEST
DL/VA % pred: 113 %
DL/VA: 4.58 ml/min/mmHg/L
DLCO cor % pred: 90 %
DLCO cor: 21.46 ml/min/mmHg
DLCO unc % pred: 91 %
DLCO unc: 21.53 ml/min/mmHg
FEF 25-75 Post: 2.17 L/sec
FEF 25-75 Pre: 2.9 L/sec
FEF2575-%Change-Post: -25 %
FEF2575-%Pred-Post: 84 %
FEF2575-%Pred-Pre: 113 %
FEV1-%Change-Post: -7 %
FEV1-%Pred-Post: 78 %
FEV1-%Pred-Pre: 84 %
FEV1-Post: 2.35 L
FEV1-Pre: 2.54 L
FEV1FVC-%Change-Post: 1 %
FEV1FVC-%Pred-Pre: 106 %
FEV6-%Change-Post: -8 %
FEV6-%Pred-Post: 74 %
FEV6-%Pred-Pre: 81 %
FEV6-Post: 2.79 L
FEV6-Pre: 3.05 L
FEV6FVC-%Pred-Post: 103 %
FEV6FVC-%Pred-Pre: 103 %
FVC-%Change-Post: -8 %
FVC-%Pred-Post: 71 %
FVC-%Pred-Pre: 78 %
FVC-Post: 2.79 L
FVC-Pre: 3.06 L
Post FEV1/FVC ratio: 84 %
Post FEV6/FVC ratio: 100 %
Pre FEV1/FVC ratio: 83 %
Pre FEV6/FVC Ratio: 100 %
RV % pred: 119 %
RV: 2.72 L
TLC % pred: 105 %
TLC: 6.12 L

## 2022-10-03 NOTE — Progress Notes (Signed)
Full PFT performed today.

## 2022-10-03 NOTE — Patient Instructions (Signed)
Shortness of breath with wheezing: You do not have asthma You can stop Dulera You can stop using albuterol  I believe that the wheezing is due to upper airway intermittent vocal cord irritation/vocal cord dysfunction.  I believe you are at increased risk for laryngospasm during anesthesia as demonstrated in 2021. Please keep the follow-up appointment with ear nose and throat When you undergo endoscopy and colonoscopy I recommend that an anesthesiologist be involved who is experienced with preventing laryngospasm.  We will see you back on an as-needed basis.

## 2022-10-03 NOTE — Patient Instructions (Signed)
Full PFT performed today.

## 2022-10-03 NOTE — Progress Notes (Signed)
Synopsis: Has been seen by [facility] several times during different hospitalizations.  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  Has been evaluated by Dr. Melene Plan for the same.  Also felt to have asthma.  Most recently hospitalized and September 2023 in the context of shortness of breath and stridor.  Managed without endotracheal intubation or tracheostomy during that visit.  Subjective:   PATIENT ID: Kaitlyn Jennings GENDER: female DOB: 09/12/1960, MRN: [id number]   HPI  Chief Complaint  Patient presents with   Follow-up    Pt states she is the same from the [place] with SOB with exertion.    Kaitlyn Jennings is doing well, no recent exacerbation of asthma, bronchitis or pneumonia.  However, she continues to struggle with dyspnea "tiredness" during the daytime.  She says "I feel like this is coming from my throat".  She is here to follow-up on lung function testing which was done today.  She continues to take [facility].  She continues to use albuterol on a daily basis.  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   Asthma    B12 deficiency    Chronic gastritis    GERD (gastroesophageal reflux disease)    Hyperlipidemia    IBS (irritable bowel syndrome)    Lupus (HCC)    secondary to Rmicade   Pancreatitis    Rectal polyp 10/01/2015   Ulcerative colitis      Review of Systems  Constitutional:  Negative for chills, fever, malaise/fatigue and weight loss.  HENT:  Negative for congestion, sinus pain and sore throat.   Respiratory:  Negative for cough, sputum production and shortness of breath.   Cardiovascular:  Negative for chest pain and leg swelling.      Objective:  Physical Exam   Vitals:   10/03/22 1408  BP: 126/74  Pulse: 91  Temp: 98.5 F (36.9 C)  TempSrc: Oral  SpO2: 97%  Weight: 253 lb (114.8 kg)  Height: '5\' 9"'$  (1.753 m)     Gen: well appearing HENT: OP  clear, neck supple PULM: CTA B, normal effort  CV: RRR, no mgr GI: BS+, soft, nontender Derm: no cyanosis or rash Psyche: normal mood and affect    CBC    Component Value Date/Time   WBC 8.3 08/28/2022 0954   RBC 4.73 08/28/2022 0954   HGB 13.5 08/28/2022 0954   HCT 40.6 08/28/2022 0954   PLT 367.0 08/28/2022 0954   MCV 85.7 08/28/2022 0954   MCH 28.5 01/08/2022 0632   MCHC 33.2 08/28/2022 0954   RDW 14.3 08/28/2022 0954   LYMPHSABS 1.7 08/28/2022 0954   MONOABS 0.5 08/28/2022 0954   EOSABS 0.3 08/28/2022 0954   BASOSABS 0.1 08/28/2022 0954     Chest imaging: May 2023 CT chest showed "no acute intrathoracic process"  PFT: 09/2022 PFT: Ratio 84%, FVC FVC 2.79 L 71% predicted, total lung capacity 6.12 L 105% predicted, DLCO 21.5 91% predicted, flow volume loop normal  Labs:  Path:  Echo:  Heart Catheterization:       Assessment & Plan:   Wheezing  Allergic rhinitis, unspecified seasonality, unspecified trigger  Vocal cord dysfunction  Discussion: Kaitlyn Jennings does not have asthma.  Her lung function test today is normal.  She can stop taking Dulera and albuterol.  I am very concerned that she has vocal cord dysfunction and more worrisome, I suspect that  what happened to her back in 2021 was laryngospasm around the time of intubation.  I think she needs assessment by ENT once again to ensure there is no other evidence of upper airway pathology.  However, lung function testing flow-volume loop today was completely normal and showed no evidence of fixed upper airway obstruction.  From my standpoint it is okay for her to proceed with colonoscopy and endoscopy, however I strongly recommend that these be performed in the hospital with an anesthesiologist familiar with management of laryngospasm.  Specifically, laryngospasm precaution should be taken such as using topical or transtracheal lidocaine prior to intubation.  Shortness of breath with wheezing: You do not have  asthma You can stop Dulera You can stop using albuterol  I believe that the wheezing is due to upper airway intermittent vocal cord irritation/vocal cord dysfunction.  I believe you are at increased risk for laryngospasm during anesthesia as demonstrated in 2021. Please keep the follow-up appointment with ear nose and throat When you undergo endoscopy and colonoscopy I recommend that an anesthesiologist be involved who is experienced with preventing laryngospasm.  We will see you back on an as-needed basis.   Immunizations: Immunization History  Administered Date(s) Administered   Influenza,inj,Quad PF,6+ Mos 07/09/2012, 08/22/2014, 07/16/2015, 07/23/2016, 06/08/2020   Influenza,inj,quad, With Preservative 08/22/2014   Influenza-Unspecified 07/09/2012, 08/22/2014, 07/16/2015, 07/23/2016   PNEUMOCOCCAL CONJUGATE-20 10/03/2021   PPD Test 09/30/2011, 08/18/2012, 10/31/2013   Pneumococcal Polysaccharide-23 08/13/2015   Tdap 07/10/2011     Current Outpatient Medications:    Calcium Carbonate-Vitamin D (CALCIUM 500 + D PO), Take 1 capsule by mouth daily.  , Disp: , Rfl:    dicyclomine (BENTYL) 20 MG tablet, TAKE 1 TABLET BY MOUTH 4 TIMES DAILY WITH MEALS AND AT BEDTIME, Disp: 120 tablet, Rfl: 0   famotidine (PEPCID) 20 MG tablet, Take 1 tablet (20 mg total) by mouth 2 (two) times daily for 14 days., Disp: 28 tablet, Rfl: 0   ferrous sulfate 325 (65 FE) MG tablet, Take 325 mg by mouth daily with breakfast.  , Disp: , Rfl:    fexofenadine (ALLEGRA) 60 MG tablet, Take 1 tablet (60 mg total) by mouth 2 (two) times daily., Disp: 30 tablet, Rfl: 0   fluticasone (FLONASE) 50 MCG/ACT nasal spray, Place 2 sprays into both nostrils daily., Disp: , Rfl:    folic acid (FOLVITE) 1 MG tablet, Take 1 tablet by mouth once daily, Disp: 90 tablet, Rfl: 0   guaiFENesin (MUCINEX) 600 MG 12 hr tablet, Take 1 tablet (600 mg total) by mouth 2 (two) times daily., Disp: 60 tablet, Rfl: 2   mercaptopurine  (PURINETHOL) 50 MG tablet, Take 1 tablet (50 mg total) by mouth daily. Give on an empty stomach 1 hour before or 2 hours after meals. Caution: Chemotherapy., Disp: 30 tablet, Rfl: 3   MOUNJARO 2.5 MG/0.5ML Pen, Inject 2.5 mg into the skin once a week., Disp: , Rfl:    mupirocin ointment (BACTROBAN) 2 %, Apply 1 application. topically 3 (three) times daily as needed., Disp: , Rfl:    nystatin ointment (MYCOSTATIN), Apply 1 application. topically 2 (two) times daily as needed (skin irriation)., Disp: , Rfl:    ondansetron (ZOFRAN) 4 MG tablet, Take 4 mg by mouth every 8 (eight) hours as needed for nausea. , Disp: , Rfl:    polyethylene glycol powder (GLYCOLAX/MIRALAX) powder, Take 17 g by mouth daily as needed for mild constipation., Disp: , Rfl:    polyethylene glycol-electrolytes (NULYTELY) 420 g solution, Follow  MD instructions for split dose colon prep, Disp: 4000 mL, Rfl: 0   rosuvastatin (CRESTOR) 10 MG tablet, Take 10 mg by mouth daily.  , Disp: , Rfl:    sucralfate (CARAFATE) 1 g tablet, Take 1 tablet (1 g total) by mouth 2 (two) times daily., Disp: 60 tablet, Rfl: 3   venlafaxine XR (EFFEXOR-XR) 150 MG 24 hr capsule, Take 150 mg by mouth daily with breakfast., Disp: , Rfl:    vitamin B-12 (CYANOCOBALAMIN) 1000 MCG tablet, Take 2,000 mcg by mouth daily., Disp: , Rfl:

## 2022-10-09 ENCOUNTER — Encounter: Payer: Self-pay | Admitting: Gastroenterology

## 2022-10-14 ENCOUNTER — Encounter: Payer: Self-pay | Admitting: Gastroenterology

## 2022-10-22 ENCOUNTER — Encounter: Payer: Medicare Other | Admitting: Gastroenterology

## 2022-10-23 NOTE — Telephone Encounter (Signed)
She will need pulmonary clearance before we schedule the colonoscopy.  Thank you

## 2022-10-24 NOTE — Telephone Encounter (Signed)
Please schedule her for next available hospital procedure, EGD and colonoscopy.  Thank you  Unfortunately our list is growing

## 2022-11-05 ENCOUNTER — Other Ambulatory Visit: Payer: Self-pay | Admitting: Gastroenterology

## 2022-11-17 ENCOUNTER — Encounter: Payer: Self-pay | Admitting: Gastroenterology

## 2022-12-08 ENCOUNTER — Other Ambulatory Visit: Payer: Self-pay | Admitting: Gastroenterology

## 2022-12-10 ENCOUNTER — Other Ambulatory Visit: Payer: Self-pay | Admitting: *Deleted

## 2022-12-10 ENCOUNTER — Encounter: Payer: Self-pay | Admitting: *Deleted

## 2022-12-10 ENCOUNTER — Telehealth: Payer: Self-pay | Admitting: *Deleted

## 2022-12-10 DIAGNOSIS — K5190 Ulcerative colitis, unspecified, without complications: Secondary | ICD-10-CM

## 2022-12-10 NOTE — Telephone Encounter (Signed)
Left message to call back, I want to see if she is interested in any of the following hospital dates for her colonoscopy 7/8   7/25   8/19

## 2022-12-10 NOTE — Telephone Encounter (Signed)
Patient returned your call.

## 2022-12-10 NOTE — Telephone Encounter (Signed)
Called patient back, I have scheduled her for 02/03/2023 at 8:30 am at [facility]. She will get her instructions through My Chart and I will send them in the mail also.  We will go over them together once she receives them in the mail.   FYI Dr Silverio Decamp  Is this ok? Instead of Previsit for her?

## 2022-12-15 NOTE — Telephone Encounter (Signed)
Ok thanks 

## 2022-12-24 ENCOUNTER — Other Ambulatory Visit: Payer: Self-pay | Admitting: Gastroenterology

## 2023-01-23 ENCOUNTER — Encounter (HOSPITAL_COMMUNITY): Payer: Self-pay | Admitting: Gastroenterology

## 2023-01-24 ENCOUNTER — Encounter: Payer: Self-pay | Admitting: Gastroenterology

## 2023-01-26 NOTE — Telephone Encounter (Signed)
Kaitlyn Jennings I would have sent this for you but she left you a note.

## 2023-01-26 NOTE — Progress Notes (Signed)
Attempted to obtain medical history via telephone, unable to reach at this time. HIPAA compliant voicemail message left requesting return call to pre surgical testing department. 

## 2023-02-01 NOTE — Anesthesia Preprocedure Evaluation (Signed)
Anesthesia Evaluation  Patient identified by MRN, date of birth, ID band Patient awake    Reviewed: Allergy & Precautions, NPO status , Patient's Chart, lab work & pertinent test results  History of Anesthesia Complications (+) history of anesthetic complications (vocal cord dysfunction)  Airway Mallampati: III  TM Distance: >3 FB Neck ROM: Full   Comment: Previous grade I view with Glidescope 3, easy mask Dental  (+) Dental Advisory Given,    Pulmonary neg pulmonary ROS   Pulmonary exam normal breath sounds clear to auscultation       Cardiovascular (-) hypertension(-) angina (-) Past MI, (-) Cardiac Stents and (-) CABG (-) dysrhythmias  Rhythm:Regular Rate:Normal  HLD   Neuro/Psych  PSYCHIATRIC DISORDERS Anxiety     negative neurological ROS     GI/Hepatic Neg liver ROS, Bowel prep,GERD  Medicated,,IBS, h/o pancreatitis, chronic gastritis, UC   Endo/Other  negative endocrine ROS    Renal/GU negative Renal ROS     Musculoskeletal  (+) Arthritis ,    Abdominal  (+) + obese  Peds  Hematology negative hematology ROS (+)   Anesthesia Other Findings Lupus  Per Pulmonary, "I believe that the wheezing is due to upper airway intermittent vocal cord irritation/vocal cord dysfunction.  I believe you are at increased risk for laryngospasm during anesthesia as demonstrated in 2021:  Reproductive/Obstetrics                              Anesthesia Physical Anesthesia Plan  ASA: 2  Anesthesia Plan: MAC   Post-op Pain Management: Minimal or no pain anticipated   Induction: Intravenous  PONV Risk Score and Plan: 2 and Propofol infusion and Treatment may vary due to age or medical condition  Airway Management Planned: Natural Airway and Nasal Cannula  Additional Equipment:   Intra-op Plan:   Post-operative Plan:   Informed Consent: I have reviewed the patients History and Physical, chart,  labs and discussed the procedure including the risks, benefits and alternatives for the proposed anesthesia with the patient or authorized representative who has indicated his/her understanding and acceptance.     Dental advisory given  Plan Discussed with: CRNA and Anesthesiologist  Anesthesia Plan Comments: (Discussed with patient risks of MAC including, but not limited to, minor pain or discomfort, hearing people in the room, and possible need for backup general anesthesia. Risks for general anesthesia also discussed including, but not limited to, sore throat, hoarse voice, chipped/damaged teeth, injury to vocal cords, nausea and vomiting, allergic reactions, lung infection, heart attack, stroke, and death. All questions answered. )        Anesthesia Quick Evaluation

## 2023-02-03 ENCOUNTER — Other Ambulatory Visit: Payer: Self-pay

## 2023-02-03 ENCOUNTER — Ambulatory Visit (HOSPITAL_BASED_OUTPATIENT_CLINIC_OR_DEPARTMENT_OTHER): Payer: Medicare Other | Admitting: Anesthesiology

## 2023-02-03 ENCOUNTER — Encounter (HOSPITAL_COMMUNITY)
Admission: RE | Disposition: A | Discharge status: Home / Self Care | Payer: Self-pay | Source: Home / Self Care | Attending: Gastroenterology

## 2023-02-03 ENCOUNTER — Encounter (HOSPITAL_COMMUNITY): Payer: Self-pay | Admitting: Gastroenterology

## 2023-02-03 ENCOUNTER — Ambulatory Visit (HOSPITAL_COMMUNITY)
Admission: RE | Admit: 2023-02-03 | Discharge: 2023-02-03 | Disposition: A | Discharge status: Home / Self Care | Payer: Medicare Other | Attending: Gastroenterology | Admitting: Gastroenterology

## 2023-02-03 ENCOUNTER — Ambulatory Visit (HOSPITAL_COMMUNITY): Payer: Medicare Other | Admitting: Anesthesiology

## 2023-02-03 DIAGNOSIS — K648 Other hemorrhoids: Secondary | ICD-10-CM

## 2023-02-03 DIAGNOSIS — K5130 Ulcerative (chronic) rectosigmoiditis without complications: Secondary | ICD-10-CM | POA: Diagnosis not present

## 2023-02-03 DIAGNOSIS — E785 Hyperlipidemia, unspecified: Secondary | ICD-10-CM | POA: Diagnosis not present

## 2023-02-03 DIAGNOSIS — K2950 Unspecified chronic gastritis without bleeding: Secondary | ICD-10-CM | POA: Diagnosis not present

## 2023-02-03 DIAGNOSIS — K5730 Diverticulosis of large intestine without perforation or abscess without bleeding: Secondary | ICD-10-CM

## 2023-02-03 DIAGNOSIS — Z1211 Encounter for screening for malignant neoplasm of colon: Secondary | ICD-10-CM | POA: Diagnosis present

## 2023-02-03 DIAGNOSIS — F419 Anxiety disorder, unspecified: Secondary | ICD-10-CM | POA: Insufficient documentation

## 2023-02-03 DIAGNOSIS — K219 Gastro-esophageal reflux disease without esophagitis: Secondary | ICD-10-CM | POA: Diagnosis not present

## 2023-02-03 DIAGNOSIS — K5150 Left sided colitis without complications: Secondary | ICD-10-CM

## 2023-02-03 DIAGNOSIS — M1990 Unspecified osteoarthritis, unspecified site: Secondary | ICD-10-CM | POA: Diagnosis not present

## 2023-02-03 DIAGNOSIS — K644 Residual hemorrhoidal skin tags: Secondary | ICD-10-CM | POA: Insufficient documentation

## 2023-02-03 DIAGNOSIS — K5190 Ulcerative colitis, unspecified, without complications: Secondary | ICD-10-CM

## 2023-02-03 HISTORY — PX: COLONOSCOPY WITH PROPOFOL: SHX5780

## 2023-02-03 HISTORY — PX: BIOPSY: SHX5522

## 2023-02-03 SURGERY — COLONOSCOPY WITH PROPOFOL
Anesthesia: Monitor Anesthesia Care

## 2023-02-03 MED ORDER — PROPOFOL 10 MG/ML IV BOLUS
INTRAVENOUS | Status: DC | PRN
  Administered 2023-02-03: 10 mg via INTRAVENOUS
  Administered 2023-02-03: 20 mg via INTRAVENOUS

## 2023-02-03 MED ORDER — SODIUM CHLORIDE 0.9 % IV SOLN: INTRAVENOUS | Status: DC

## 2023-02-03 MED ORDER — PROPOFOL 500 MG/50ML IV EMUL
INTRAVENOUS | Status: DC | PRN
  Administered 2023-02-03: 125 ug/kg/min via INTRAVENOUS

## 2023-02-03 MED ORDER — GLYCOPYRROLATE 0.2 MG/ML IJ SOLN
INTRAMUSCULAR | Status: DC | PRN
  Administered 2023-02-03: .2 mg via INTRAVENOUS

## 2023-02-03 MED ORDER — PROPOFOL 1000 MG/100ML IV EMUL
INTRAVENOUS | Status: AC
  Filled 2023-02-03: qty 100

## 2023-02-03 MED ORDER — PHENYLEPHRINE 80 MCG/ML (10ML) SYRINGE FOR IV PUSH (FOR BLOOD PRESSURE SUPPORT)
PREFILLED_SYRINGE | INTRAVENOUS | Status: DC | PRN
  Administered 2023-02-03 (×2): 160 ug via INTRAVENOUS

## 2023-02-03 MED ORDER — LACTATED RINGERS IV SOLN: INTRAVENOUS | Status: DC

## 2023-02-03 SURGICAL SUPPLY — 22 items

## 2023-02-03 NOTE — Anesthesia Procedure Notes (Signed)
Procedure Name: MAC Date/Time: 02/03/2023 8:45 AM  Performed by: Lovie Chol, CRNAPre-anesthesia Checklist: Patient identified, Emergency Drugs available, Suction available and Patient being monitored

## 2023-02-03 NOTE — Discharge Instructions (Signed)
YOU HAD AN ENDOSCOPIC PROCEDURE TODAY: Refer to the procedure report and other information in the discharge instructions given to you for any specific questions about what was found during the examination. If this information does not answer your questions, please call [facility] office at [phone] to clarify.   YOU SHOULD EXPECT: Some feelings of bloating in the abdomen. Passage of more gas than usual. Walking can help get rid of the air that was put into your GI tract during the procedure and reduce the bloating. If you had a lower endoscopy (such as a colonoscopy or flexible sigmoidoscopy) you may notice spotting of blood in your stool or on the toilet paper. Some abdominal soreness may be present for a day or two, also.  DIET: Your first meal following the procedure should be a light meal and then it is ok to progress to your normal diet. A half-sandwich or bowl of soup is an example of a good first meal. Heavy or fried foods are harder to digest and may make you feel nauseous or bloated. Drink plenty of fluids but you should avoid alcoholic beverages for 24 hours. If you had a esophageal dilation, please see attached instructions for diet.    ACTIVITY: Your care partner should take you home directly after the procedure. You should plan to take it easy, moving slowly for the rest of the day. You can resume normal activity the day after the procedure however YOU SHOULD NOT DRIVE, use power tools, machinery or perform tasks that involve climbing or major physical exertion for 24 hours (because of the sedation medicines used during the test).   SYMPTOMS TO REPORT IMMEDIATELY: A gastroenterologist can be reached at any hour. Please call [phone]  for any of the following symptoms:  Following lower endoscopy (colonoscopy, flexible sigmoidoscopy) Excessive amounts of blood in the stool  Significant tenderness, worsening of abdominal pains  Swelling of the abdomen that is new, acute  Fever of 100  or higher  Following upper endoscopy (EGD, EUS, ERCP, esophageal dilation) Vomiting of blood or coffee ground material  New, significant abdominal pain  New, significant chest pain or pain under the shoulder blades  Painful or persistently difficult swallowing  New shortness of breath  Black, tarry-looking or red, bloody stools  FOLLOW UP:  If any biopsies were taken you will be contacted by phone or by letter within the next 1-3 weeks. Call [phone]  if you have not heard about the biopsies in 3 weeks.  Please also call with any specific questions about appointments or follow up tests. YOU HAD AN ENDOSCOPIC PROCEDURE TODAY: Refer to the procedure report and other information in the discharge instructions given to you for any specific questions about what was found during the examination. If this information does not answer your questions, please call [facility] office at [phone] to clarify.   YOU SHOULD EXPECT: Some feelings of bloating in the abdomen. Passage of more gas than usual. Walking can help get rid of the air that was put into your GI tract during the procedure and reduce the bloating. If you had a lower endoscopy (such as a colonoscopy or flexible sigmoidoscopy) you may notice spotting of blood in your stool or on the toilet paper. Some abdominal soreness may be present for a day or two, also.  DIET: Your first meal following the procedure should be a light meal and then it is ok to progress to your normal diet. A half-sandwich or bowl of soup is an   example of a good first meal. Heavy or fried foods are harder to digest and may make you feel nauseous or bloated. Drink plenty of fluids but you should avoid alcoholic beverages for 24 hours. If you had a esophageal dilation, please see attached instructions for diet.    ACTIVITY: Your care partner should take you home directly after the procedure. You should plan to take it easy, moving slowly for the rest of the day. You can resume  normal activity the day after the procedure however YOU SHOULD NOT DRIVE, use power tools, machinery or perform tasks that involve climbing or major physical exertion for 24 hours (because of the sedation medicines used during the test).   SYMPTOMS TO REPORT IMMEDIATELY: A gastroenterologist can be reached at any hour. Please call [phone]  for any of the following symptoms:  Following lower endoscopy (colonoscopy, flexible sigmoidoscopy) Excessive amounts of blood in the stool  Significant tenderness, worsening of abdominal pains  Swelling of the abdomen that is new, acute  Fever of 100 or higher  Following upper endoscopy (EGD, EUS, ERCP, esophageal dilation) Vomiting of blood or coffee ground material  New, significant abdominal pain  New, significant chest pain or pain under the shoulder blades  Painful or persistently difficult swallowing  New shortness of breath  Black, tarry-looking or red, bloody stools  FOLLOW UP:  If any biopsies were taken you will be contacted by phone or by letter within the next 1-3 weeks. Call [phone]  if you have not heard about the biopsies in 3 weeks.  Please also call with any specific questions about appointments or follow up tests.YOU HAD AN ENDOSCOPIC PROCEDURE TODAY: Refer to the procedure report and other information in the discharge instructions given to you for any specific questions about what was found during the examination. If this information does not answer your questions, please call [facility] office at [phone] to clarify.   YOU SHOULD EXPECT: Some feelings of bloating in the abdomen. Passage of more gas than usual. Walking can help get rid of the air that was put into your GI tract during the procedure and reduce the bloating. If you had a lower endoscopy (such as a colonoscopy or flexible sigmoidoscopy) you may notice spotting of blood in your stool or on the toilet paper. Some abdominal soreness may be present for a day or two,  also.  DIET: Your first meal following the procedure should be a light meal and then it is ok to progress to your normal diet. A half-sandwich or bowl of soup is an example of a good first meal. Heavy or fried foods are harder to digest and may make you feel nauseous or bloated. Drink plenty of fluids but you should avoid alcoholic beverages for 24 hours. If you had a esophageal dilation, please see attached instructions for diet.    ACTIVITY: Your care partner should take you home directly after the procedure. You should plan to take it easy, moving slowly for the rest of the day. You can resume normal activity the day after the procedure however YOU SHOULD NOT DRIVE, use power tools, machinery or perform tasks that involve climbing or major physical exertion for 24 hours (because of the sedation medicines used during the test).   SYMPTOMS TO REPORT IMMEDIATELY: A gastroenterologist can be reached at any hour. Please call [phone]  for any of the following symptoms:  Following lower endoscopy (colonoscopy, flexible sigmoidoscopy) Excessive amounts of blood in the stool  Significant tenderness, worsening   of abdominal pains  Swelling of the abdomen that is new, acute  Fever of 100 or higher  Following upper endoscopy (EGD, EUS, ERCP, esophageal dilation) Vomiting of blood or coffee ground material  New, significant abdominal pain  New, significant chest pain or pain under the shoulder blades  Painful or persistently difficult swallowing  New shortness of breath  Black, tarry-looking or red, bloody stools  FOLLOW UP:  If any biopsies were taken you will be contacted by phone or by letter within the next 1-3 weeks. Call [phone]  if you have not heard about the biopsies in 3 weeks.  Please also call with any specific questions about appointments or follow up tests. YOU HAD AN ENDOSCOPIC PROCEDURE TODAY: Refer to the procedure report and other information in the discharge instructions  given to you for any specific questions about what was found during the examination. If this information does not answer your questions, please call [facility] office at [phone] to clarify.   YOU SHOULD EXPECT: Some feelings of bloating in the abdomen. Passage of more gas than usual. Walking can help get rid of the air that was put into your GI tract during the procedure and reduce the bloating. If you had a lower endoscopy (such as a colonoscopy or flexible sigmoidoscopy) you may notice spotting of blood in your stool or on the toilet paper. Some abdominal soreness may be present for a day or two, also.  DIET: Your first meal following the procedure should be a light meal and then it is ok to progress to your normal diet. A half-sandwich or bowl of soup is an example of a good first meal. Heavy or fried foods are harder to digest and may make you feel nauseous or bloated. Drink plenty of fluids but you should avoid alcoholic beverages for 24 hours. If you had a esophageal dilation, please see attached instructions for diet.    ACTIVITY: Your care partner should take you home directly after the procedure. You should plan to take it easy, moving slowly for the rest of the day. You can resume normal activity the day after the procedure however YOU SHOULD NOT DRIVE, use power tools, machinery or perform tasks that involve climbing or major physical exertion for 24 hours (because of the sedation medicines used during the test).   SYMPTOMS TO REPORT IMMEDIATELY: A gastroenterologist can be reached at any hour. Please call [phone]  for any of the following symptoms:  Following lower endoscopy (colonoscopy, flexible sigmoidoscopy) Excessive amounts of blood in the stool  Significant tenderness, worsening of abdominal pains  Swelling of the abdomen that is new, acute  Fever of 100 or higher  Following upper endoscopy (EGD, EUS, ERCP, esophageal dilation) Vomiting of blood or coffee ground material   New, significant abdominal pain  New, significant chest pain or pain under the shoulder blades  Painful or persistently difficult swallowing  New shortness of breath  Black, tarry-looking or red, bloody stools  FOLLOW UP:  If any biopsies were taken you will be contacted by phone or by letter within the next 1-3 weeks. Call [phone]  if you have not heard about the biopsies in 3 weeks.  Please also call with any specific questions about appointments or follow up tests. 

## 2023-02-03 NOTE — Anesthesia Postprocedure Evaluation (Signed)
Anesthesia Post Note  Patient: Kaitlyn Jennings  Procedure(s) Performed: COLONOSCOPY WITH PROPOFOL BIOPSY     Patient location during evaluation: PACU Anesthesia Type: MAC Level of consciousness: awake Pain management: pain level controlled Vital Signs Assessment: post-procedure vital signs reviewed and stable Respiratory status: spontaneous breathing, nonlabored ventilation and respiratory function stable Cardiovascular status: stable and blood pressure returned to baseline Postop Assessment: no apparent nausea or vomiting Anesthetic complications: no   No notable events documented.  Last Vitals:  Vitals:   02/03/23 0924 02/03/23 0940  BP: 136/79 136/89  Pulse: 95   Resp: 13   Temp:    SpO2: 100%     Last Pain:  Vitals:   02/03/23 0940  TempSrc:   PainSc: 0-No pain                 Linton Rump

## 2023-02-03 NOTE — H&P (Signed)
[facility] History and Physical   Primary Care Physician:  Shellia Cleverly, PA   Reason for Procedure:   H/o IBD >10 years  Plan:    Colonoscopy with possible intervention     HPI: Kaitlyn Jennings is a 62 y.o. female with h/o longstanding IBD is here for surveillance colonoscopy. The risks and benefits as well as alternatives of endoscopic procedure(s) have been discussed and reviewed. All questions answered. The patient agrees to proceed.    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   Asthma    B12 deficiency    Chronic gastritis    GERD (gastroesophageal reflux disease)    Hyperlipidemia    IBS (irritable bowel syndrome)    Lupus (HCC)    secondary to Rmicade   Pancreatitis    Rectal polyp 10/01/2015   Ulcerative colitis     Past Surgical History:  Procedure Laterality Date   BREAST CYST ASPIRATION     left   ESOPHAGUS SURGERY     SINUS SURGERY WITH INSTATRAK      Prior to Admission medications   Medication Sig Start Date End Date Taking? Authorizing Provider  Aspirin-Caffeine (BC FAST PAIN RELIEF PO) Take 0.5 packets by mouth daily as needed (pain).   Yes [provider]  bismuth subsalicylate (PEPTO BISMOL) 262 MG/15ML suspension Take 30 mLs by mouth every 6 (six) hours as needed for indigestion or diarrhea or loose stools.   Yes [provider]  buPROPion (WELLBUTRIN XL) 150 MG 24 hr tablet Take 150 mg by mouth daily. 01/23/22  Yes [provider]  cetirizine (ZYRTEC) 10 MG tablet Take 10 mg by mouth daily as needed for allergies.   Yes [provider]  Cholecalciferol (VITAMIN D) 50 MCG (2000 UT) tablet Take 8,000 Units by mouth daily.   Yes [provider]  Cyanocobalamin (B-12) 5000 MCG CAPS Take 5,000 mcg by mouth daily.   Yes [provider]  dicyclomine (BENTYL) 20 MG tablet TAKE 1 TABLET BY MOUTH 4 TIMES DAILY WITH MEALS AND AT BEDTIME Patient  taking differently: Take 20 mg by mouth 4 (four) times daily as needed (UC flare). 07/10/22  Yes Greer Koeppen, Eleonore Chiquito, MD  famotidine (PEPCID) 40 MG tablet Take 40 mg by mouth daily.   Yes [provider]  ferrous sulfate 325 (65 FE) MG tablet Take 325 mg by mouth daily with breakfast.     Yes [provider]  fluticasone (FLONASE) 50 MCG/ACT nasal spray Place 2 sprays into both nostrils daily as needed for allergies.   Yes [provider]  folic acid (FOLVITE) 1 MG tablet Take 1 tablet by mouth once daily 12/24/22  Yes Zakye Baby, Eleonore Chiquito, MD  guaiFENesin (MUCINEX) 600 MG 12 hr tablet Take 600 mg by mouth 2 (two) times daily.   Yes [provider]  mercaptopurine (PURINETHOL) 50 MG tablet Take 1 tablet (50 mg total) by mouth daily. Give on an empty stomach 1 hour before or 2 hours after meals. Caution: Chemotherapy. Patient taking differently: Take 50 mg by mouth daily as needed (UC flare). Give on an empty stomach 1 hour before or 2 hours after meals. Caution: Chemotherapy. 08/28/22  Yes Zabdiel Dripps, Eleonore Chiquito, MD  mesalamine (LIALDA) 1.2 g EC tablet Take 4.8 g by mouth every morning.   Yes [provider]  mupirocin ointment (BACTROBAN) 2 % Apply 1 application  topically 3 (three) times daily  as needed (wound care). 12/14/17  Yes [provider]  nystatin ointment (MYCOSTATIN) Apply 1 application. topically 2 (two) times daily as needed (skin irriation).   Yes [provider]  nystatin powder Apply 1 Application topically 2 (two) times daily.   Yes [provider]  ondansetron (ZOFRAN) 4 MG tablet Take 4 mg by mouth every 8 (eight) hours as needed for nausea.    Yes [provider]  polyethylene glycol-electrolytes (NULYTELY) 420 g solution Follow MD instructions for split dose colon prep 09/03/22  Yes Kyndel Egger V, MD  rosuvastatin (CRESTOR) 10 MG tablet Take 10 mg by mouth daily.     Yes [provider]   sucralfate (CARAFATE) 1 g tablet Take 1 tablet by mouth twice daily 12/08/22  Yes Leonore Frankson, Eleonore Chiquito, MD  venlafaxine XR (EFFEXOR-XR) 150 MG 24 hr capsule Take 150 mg by mouth daily with breakfast.   Yes [provider]    Current Facility-Administered Medications  Medication Dose Route Frequency Provider Last Rate Last Admin   0.9 %  sodium chloride infusion   Intravenous Continuous Kenyon Eshleman, Eleonore Chiquito, MD       lactated ringers infusion   Intravenous Continuous Syrita Dovel, Eleonore Chiquito, MD        Allergies as of 12/10/2022 - Review Complete 10/03/2022  Allergen Reaction Noted   Valsartan Swelling 01/07/2022   Amoxicillin Other (See Comments) 08/08/2014   Infliximab Other (See Comments) 08/26/2006   Molds & smuts  02/09/2013   Oseltamivir Nausea And Vomiting and Other (See Comments) 10/09/2016    Family History  Problem Relation Age of Onset   Irritable bowel syndrome Father    COPD Father    Diabetes Maternal Grandfather    Colon cancer Neg Hx    Stomach cancer Neg Hx     Social History   Socioeconomic History   Marital status: Married    Spouse name: Not on file   Number of children: 0   Years of education: Not on file   Highest education level: Not on file  Occupational History   Occupation: FLIGHT ATTENDANT  Tobacco Use   Smoking status: Never   Smokeless tobacco: Never  Vaping Use   Vaping Use: Never used  Substance and Sexual Activity   Alcohol use: No    Alcohol/week: 0.0 standard drinks of alcohol   Drug use: No   Sexual activity: Yes    Partners: Male    Birth control/protection: Pill  Other Topics Concern   Not on file  Social History Narrative   Not on file   Social Determinants of Health   Financial Resource Strain: Not on file  Food Insecurity: Not on file  Transportation Needs: Not on file  Physical Activity: Not on file  Stress: Not on file  Social Connections: Not on file  Intimate Partner Violence: Not on file    Review of  Systems:  All other review of systems negative except as mentioned in the HPI.  Physical Exam: Vital signs in last 24 hours: Temp:  [96.9 F (36.1 C)] 96.9 F (36.1 C) (05/28 0739) Pulse Rate:  [94] 94 (05/28 0739) Resp:  [16] 16 (05/28 0739) BP: (172)/(97) 172/97 (05/28 0739) SpO2:  [98 %] 98 % (05/28 0739) Weight:  [109.8 kg] 109.8 kg (05/28 0739)   General:   Alert, NAD Lungs:  Clear .   Heart:  Regular rate and rhythm Abdomen:  Soft, nontender and nondistended. Neuro/Psych:  Alert and cooperative. Normal mood and affect.  A and O x 3   K. Scherry Ran , MD [phone]

## 2023-02-03 NOTE — Op Note (Signed)
[facility] Patient Name: Kaitlyn Jennings Procedure Date: 02/03/2023 MRN: [id number] Attending MD: Napoleon Form , MD, [id number] Date of Birth: Feb 10, 1961 CSN: [id number] Age: 62 Admit Type: Outpatient Procedure:                Colonoscopy Indications:              High risk colon cancer surveillance: Ulcerative                            left sided colitis of 8 (or more) years duration Providers:                Napoleon Form, MD, Fransisca Connors, Marja Kays, Technician Referring MD:              Medicines:                Monitored Anesthesia Care Complications:            No immediate complications. Estimated Blood Loss:     Estimated blood loss was minimal. Procedure:                Pre-Anesthesia Assessment:                           - Prior to the procedure, a History and Physical                            was performed, and patient medications and                            allergies were reviewed. The patient's tolerance of                            previous anesthesia was also reviewed. The risks                            and benefits of the procedure and the sedation                            options and risks were discussed with the patient.                            All questions were answered, and informed consent                            was obtained. Prior Anticoagulants: The patient has                            taken no anticoagulant or antiplatelet agents. ASA                            Grade Assessment: III - A patient with severe  systemic disease. After reviewing the risks and                            benefits, the patient was deemed in satisfactory                            condition to undergo the procedure.                           After obtaining informed consent, the colonoscope                            was passed under direct vision. Throughout the                             procedure, the patient's blood pressure, pulse, and                            oxygen saturations were monitored continuously. The                            PCF-HQ190L ([id number] Olympus colonoscope was                            introduced through the anus and advanced to the the                            cecum, identified by appendiceal orifice and                            ileocecal valve. The colonoscopy was performed                            without difficulty. The patient tolerated the                            procedure well. The quality of the bowel                            preparation was good. The ileocecal valve,                            appendiceal orifice, and rectum were photographed. Scope In: 8:46:32 AM Scope Out: 9:08:22 AM Scope Withdrawal Time: 0 hours 10 minutes 11 seconds  Total Procedure Duration: 0 hours 21 minutes 50 seconds  Findings:      The perianal and digital rectal examinations were normal.      Inflammation was found in a continuous and circumferential pattern from       the anus to the sigmoid colon. This was graded as Mayo Score 1 (mild,       with erythema, decreased vascular pattern, mild friability), and when       compared to the previous examination, the findings are quiescent.       Biopsies were taken with a cold forceps for histology from right and  left colon.      Scattered small-mouthed diverticula were found in the sigmoid colon and       descending colon.      Non-bleeding external and internal hemorrhoids were found during       retroflexion. The hemorrhoids were medium-sized. 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                            Biopsied.                           - Diverticulosis in the sigmoid colon and in the                            descending colon.                           -  Non-bleeding external and internal hemorrhoids. Moderate Sedation:      N/A Recommendation:           - Patient has a contact number available for                            emergencies. The signs and symptoms of potential                            delayed complications were discussed with the                            patient. Return to normal activities tomorrow.                            Written discharge instructions were provided to the                            patient.                           - Resume previous diet.                           - Continue present medications.                           - Await pathology results.                           - Repeat colonoscopy in 3 years for surveillance                            based on pathology results.                           -  Return to GI clinic at the next available                            appointment. Procedure Code(s):        --- Professional ---                           [phone], Colonoscopy, flexible; with biopsy, single                            or multiple Diagnosis Code(s):        --- Professional ---                           K51.50, Left sided colitis without complications                           K51.30, Ulcerative (chronic) rectosigmoiditis                            without complications                           K64.8, Other hemorrhoids                           K57.30, Diverticulosis of large intestine without                            perforation or abscess without bleeding CPT copyright 2022 [employer]. All rights reserved. The codes documented in this report are preliminary and upon coder review may  be revised to meet current compliance requirements. Napoleon Form, MD 02/03/2023 9:24:31 AM This report has been signed electronically. Number of Addenda: 0

## 2023-02-03 NOTE — Transfer of Care (Signed)
Immediate Anesthesia Transfer of Care Note  Patient: Kaitlyn Jennings  Procedure(s) Performed: COLONOSCOPY WITH PROPOFOL BIOPSY  Patient Location: Endoscopy Unit  Anesthesia Type:MAC  Level of Consciousness: awake, oriented, and patient cooperative  Airway & Oxygen Therapy: Patient Spontanous Breathing and Patient connected to face mask oxygen  Post-op Assessment: Report given to RN and Post -op Vital signs reviewed and stable  Post vital signs: Reviewed  Last Vitals:  Vitals Value Taken Time  BP 173/124 02/03/23 0921  Temp    Pulse 95 02/03/23 0923  Resp 14 02/03/23 0923  SpO2 100 % 02/03/23 0923  Vitals shown include unvalidated device data.  Last Pain:  Vitals:   02/03/23 0739  TempSrc: Temporal  PainSc: 7       Patients Stated Pain Goal: 0 (02/03/23 0739)  Complications: No notable events documented.

## 2023-02-04 ENCOUNTER — Encounter: Payer: Self-pay | Admitting: Gastroenterology

## 2023-02-04 LAB — SURGICAL PATHOLOGY

## 2023-02-08 ENCOUNTER — Encounter (HOSPITAL_COMMUNITY): Payer: Self-pay | Admitting: Gastroenterology

## 2023-03-26 ENCOUNTER — Other Ambulatory Visit: Payer: Self-pay | Admitting: Gastroenterology

## 2023-04-01 ENCOUNTER — Encounter: Payer: Self-pay | Admitting: Gastroenterology

## 2023-04-09 ENCOUNTER — Ambulatory Visit: Payer: Medicare Other | Admitting: Gastroenterology

## 2023-04-21 ENCOUNTER — Other Ambulatory Visit: Payer: Self-pay | Admitting: Gastroenterology

## 2023-05-31 ENCOUNTER — Encounter: Payer: Self-pay | Admitting: Gastroenterology

## 2023-06-01 ENCOUNTER — Telehealth: Payer: Self-pay | Admitting: *Deleted

## 2023-06-01 NOTE — Telephone Encounter (Signed)
Called patient in reference to complaints about constipation and the inability to be relieved. Patient states that she has recently taken Dulcolax for 3 consecutive days and only having small bowel movement with 1 noted bowel movement she deems t be a good bowel movement in 5 days. Patient was encouraged to take Miralax and being able to take 1-3 capfuls daily. Also encouraged to try for 1 week and if no improvement to call back. Patient understood and agreed.

## 2023-06-03 ENCOUNTER — Encounter: Payer: Self-pay | Admitting: Gastroenterology

## 2023-06-08 ENCOUNTER — Encounter: Payer: Self-pay | Admitting: Gastroenterology

## 2023-06-09 ENCOUNTER — Other Ambulatory Visit: Payer: Self-pay | Admitting: Gastroenterology

## 2023-06-11 ENCOUNTER — Telehealth: Payer: Self-pay | Admitting: *Deleted

## 2023-06-11 NOTE — Telephone Encounter (Signed)
Patient called to inform to take 1 capful of Miralax daily to have regular BM and can adjust to have 1-2 soft BM's. Patient not available, left VM per DPR. Informed patient to call if any questions.

## 2023-07-11 ENCOUNTER — Other Ambulatory Visit: Payer: Self-pay | Admitting: Gastroenterology

## 2023-08-05 ENCOUNTER — Other Ambulatory Visit: Payer: Self-pay | Admitting: Gastroenterology

## 2023-08-07 ENCOUNTER — Other Ambulatory Visit: Payer: Self-pay | Admitting: Gastroenterology

## 2023-08-18 ENCOUNTER — Encounter: Payer: Self-pay | Admitting: Gastroenterology

## 2023-08-19 ENCOUNTER — Ambulatory Visit: Payer: Medicare Other | Admitting: Gastroenterology

## 2023-08-21 ENCOUNTER — Other Ambulatory Visit: Payer: Self-pay | Admitting: Gastroenterology

## 2023-08-31 ENCOUNTER — Other Ambulatory Visit: Payer: Self-pay | Admitting: Gastroenterology

## 2023-09-13 ENCOUNTER — Other Ambulatory Visit: Payer: Self-pay | Admitting: Gastroenterology

## 2023-09-15 ENCOUNTER — Ambulatory Visit: Payer: Medicare Other | Admitting: Physician Assistant

## 2023-09-19 ENCOUNTER — Encounter (HOSPITAL_COMMUNITY): Payer: Self-pay | Admitting: Emergency Medicine

## 2023-09-19 ENCOUNTER — Ambulatory Visit (HOSPITAL_COMMUNITY)
Admission: EM | Admit: 2023-09-19 | Discharge: 2023-09-19 | Disposition: A | Discharge status: Home / Self Care | Payer: Medicare Other | Attending: Emergency Medicine | Admitting: Emergency Medicine

## 2023-09-19 ENCOUNTER — Other Ambulatory Visit: Payer: Self-pay

## 2023-09-19 DIAGNOSIS — H60502 Unspecified acute noninfective otitis externa, left ear: Secondary | ICD-10-CM

## 2023-09-19 DIAGNOSIS — H7292 Unspecified perforation of tympanic membrane, left ear: Secondary | ICD-10-CM | POA: Diagnosis not present

## 2023-09-19 DIAGNOSIS — S93402A Sprain of unspecified ligament of left ankle, initial encounter: Secondary | ICD-10-CM | POA: Diagnosis not present

## 2023-09-19 MED ORDER — OFLOXACIN 0.3 % OT SOLN: 5.0000 [drp] | Freq: Two times a day (BID) | OTIC | 0 refills | Status: AC

## 2023-09-19 NOTE — Discharge Instructions (Signed)
 Use the ear drops twice daily for 7 days I am not sure if the ear drum is perforated; however these drops are still safe to use! If there is eardrum damage it can heal in several weeks. Please keep follow up appointment with your ENT!  I believe your ankle is sprained. You can wear the ace wrap for support. Elevate and ice the ankle as needed

## 2023-09-19 NOTE — ED Provider Notes (Signed)
 [facility]    CSN: [id number] Arrival date & time: 09/19/23  1307     History   Chief Complaint Chief Complaint  Patient presents with   Otalgia    HPI Kaitlyn Jennings is a 63 y.o. female.  Left ear pain and bleeding x 2 hours She had used a q-tip this morning and rinsed with alcohol When laying on left side she developed the pain, noticed blood Now rating 8/10 pain Some muffled hearing. Tried tylenol   Also reports twisting ankle 3 days ago. Not necessarily painful but some discomfort. 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   Pancreatitis    Rectal polyp 10/01/2015   Ulcerative colitis     Patient Active Problem List   Diagnosis Date Noted   Anticipated difficulty with intubation    Encounter for orogastric (OG) tube placement    Acute respiratory failure (HCC) 05/25/2020   Stridor    Upper airway cough syndrome 04/05/2018   B12 DEFICIENCY 03/26/2010   GASTRITIS 02/04/2008   ABDOMINAL PAIN, EPIGASTRIC 02/04/2008   ANXIETY 09/25/2007   Ulcerative colitis (HCC) 09/25/2007   IRRITABLE BOWEL SYNDROME 09/25/2007   ARTHRITIS 09/25/2007   PANCREATITIS, ACUTE, HX OF 09/25/2007    Past Surgical History:  Procedure Laterality Date   BIOPSY  02/03/2023   Procedure: BIOPSY;  Surgeon: Shila Gustav GAILS, MD;  Location: [facility];  Service: Gastroenterology;;   BREAST CYST ASPIRATION     left   COLONOSCOPY WITH PROPOFOL  N/A 02/03/2023   Procedure: COLONOSCOPY WITH PROPOFOL ;  Surgeon: Shila Gustav GAILS, MD;  Location: [facility];  Service: Gastroenterology;  Laterality: N/A;   ESOPHAGUS SURGERY     SINUS SURGERY WITH INSTATRAK      OB History   No obstetric history on file.       Home Medications    Prior to Admission medications   Medication Sig Start Date End Date Taking? Authorizing Provider  ofloxacin  (FLOXIN ) 0.3 % OTIC solution Place 5 drops into the left ear 2 (two) times daily for 7 days. 09/19/23 09/26/23 Yes Ayeden Gladman, [place], PA-C  Aspirin-Caffeine (BC FAST PAIN RELIEF PO) Take 0.5 packets by mouth daily as needed (pain).    [provider]  bismuth subsalicylate (PEPTO BISMOL) 262 MG/15ML suspension Take 30 mLs by mouth every 6 (six) hours as needed for indigestion or diarrhea or loose stools.    [provider]  buPROPion  (WELLBUTRIN  XL) 150 MG 24 hr tablet Take 150 mg by mouth daily. 01/23/22   [provider]  cetirizine (ZYRTEC) 10 MG tablet Take 10 mg by mouth daily as needed for allergies. Patient not taking: Reported on 09/19/2023    [provider]  Cholecalciferol (VITAMIN D ) 50 MCG (2000 UT) tablet Take 8,000 Units by mouth daily.    [provider]  Cyanocobalamin  (B-12) 5000 MCG CAPS Take 5,000 mcg by mouth daily.    [provider]  dicyclomine  (BENTYL ) 20 MG tablet TAKE 1 TABLET BY MOUTH 4 TIMES DAILY WITH MEALS AND AT BEDTIME 04/22/23   Nandigam, Kavitha V, MD  famotidine  (PEPCID ) 40 MG tablet Take 40 mg by mouth  daily.    [provider]  ferrous sulfate 325 (65 FE) MG tablet Take 325 mg by mouth daily with breakfast.      [provider]  fluticasone (FLONASE) 50 MCG/ACT nasal spray Place 2 sprays into both nostrils daily as needed for allergies.    [provider]  folic acid  (FOLVITE ) 1 MG tablet Take 1 tablet by mouth once daily 08/21/23   Nandigam, Kavitha V, MD  guaiFENesin  (MUCINEX ) 600 MG 12 hr tablet Take 600 mg by mouth 2 (two) times daily. Patient not taking: Reported on 09/19/2023    [provider]  mesalamine  (LIALDA ) 1.2 g EC tablet TAKE 4 TABLETS BY MOUTH ONCE DAILY WITH BREAKFAST 09/03/23   Nandigam, Kavitha V, MD  mupirocin  ointment  (BACTROBAN ) 2 % Apply 1 application  topically 3 (three) times daily as needed (wound care). 12/14/17   [provider]  nystatin ointment (MYCOSTATIN) Apply 1 application. topically 2 (two) times daily as needed (skin irriation).    [provider]  nystatin powder Apply 1 Application topically 2 (two) times daily.    [provider]  ondansetron  (ZOFRAN ) 4 MG tablet Take 4 mg by mouth every 8 (eight) hours as needed for nausea.     [provider]  polyethylene glycol-electrolytes (NULYTELY ) 420 g solution Follow MD instructions for split dose colon prep 09/03/22   Nandigam, Kavitha V, MD  rosuvastatin  (CRESTOR ) 10 MG tablet Take 10 mg by mouth daily.      [provider]  sucralfate  (CARAFATE ) 1 g tablet Take 1 tablet by mouth twice daily 09/14/23   Nandigam, Kavitha V, MD  venlafaxine XR (EFFEXOR-XR) 150 MG 24 hr capsule Take 150 mg by mouth daily with breakfast.    [provider]    Family History Family History  Problem Relation Age of Onset   Irritable bowel syndrome Father    COPD Father    Diabetes Maternal Grandfather    Colon cancer Neg Hx    Stomach cancer Neg Hx     Social History Social History   Tobacco Use   Smoking status: Never   Smokeless tobacco: Never  Vaping Use   Vaping status: Never Used  Substance Use Topics   Alcohol use: No    Alcohol/week: 0.0 standard drinks of alcohol   Drug use: No     Allergies   Valsartan, Amoxicillin, Infliximab, Molds & smuts, and Oseltamivir   Review of Systems Review of Systems  HENT:  Positive for ear pain.    Per HPI  Physical Exam Triage Vital Signs ED Triage Vitals  Encounter Vitals Group     BP 09/19/23 1412 (!) 157/92     Systolic BP Percentile --      Diastolic BP Percentile --      Pulse Rate 09/19/23 1412 82     Resp 09/19/23 1412 20     Temp 09/19/23 1412 98.9 F (37.2 C)     Temp Source 09/19/23 1412 Oral     SpO2 09/19/23 1412 96 %     Weight  --      Height --      Head Circumference --      Peak Flow --      Pain Score 09/19/23 1409 7     Pain Loc --      Pain Education --      Exclude from Growth Chart --    No data found.  Updated Vital Signs BP ROLLEN)  157/92 (BP Location: Left Arm)   Pulse 82   Temp 98.9 F (37.2 C) (Oral)   Resp 20   LMP 11/12/2016 (Approximate)   SpO2 96%    Physical Exam Vitals and nursing note reviewed.  Constitutional:      General: She is not in acute distress. HENT:     Right Ear: Hearing, tympanic membrane and ear canal normal.     Left Ear: Swelling and tenderness present. Tympanic membrane is perforated.     Ears:     Comments: Dried blood external left ear. There is blood in the canal with swelling and tenderness. Most of TM is visualized, intact. The lower left portion has blood, unclear if perforation     Mouth/Throat:     Pharynx: Oropharynx is clear.  Cardiovascular:     Rate and Rhythm: Normal rate and regular rhythm.     Pulses: Normal pulses.  Pulmonary:     Effort: Pulmonary effort is normal.  Musculoskeletal:        General: No swelling, tenderness, deformity or signs of injury.     Cervical back: Normal range of motion.     Comments: Normal ROM ankle. No bony tenderness. Distal sensation intact. Strong DP pulse. Cap refill < 2 seconds  Skin:    Capillary Refill: Capillary refill takes less than 2 seconds.  Neurological:     Mental Status: She is alert and oriented to person, place, and time.     UC Treatments / Results  Labs (all labs ordered are listed, but only abnormal results are displayed) Labs Reviewed - No data to display  EKG  Radiology No results found.  Procedures Procedures   Medications Ordered in UC Medications - No data to display  Initial Impression / Assessment and Plan / UC Course  I have reviewed the triage vital signs and the nursing notes.  Pertinent labs & imaging results that were available during my care of the patient were  reviewed by me and considered in my medical decision making (see chart for details).  Left otitis media, unclear if perforation Discussed with patient Ofloxacin  BID x 7 days She has an ENT appointment in 2 weeks, will follow up then  Left ankle sprain Ace wrap provided. Discussed RICE therapy. Low concern for fracture, defer imaging at this time. Can return if needed Patient agrees to plan, no questions   Final Clinical Impressions(s) / UC Diagnoses   Final diagnoses:  Acute otitis externa of left ear, unspecified type  Perforated ear drum, left  Sprain of left ankle, unspecified ligament, initial encounter     Discharge Instructions      Use the ear drops twice daily for 7 days I am not sure if the ear drum is perforated; however these drops are still safe to use! If there is eardrum damage it can heal in several weeks. Please keep follow up appointment with your ENT!  I believe your ankle is sprained. You can wear the ace wrap for support. Elevate and ice the ankle as needed     ED Prescriptions     Medication Sig Dispense Auth. Provider   ofloxacin  (FLOXIN ) 0.3 % OTIC solution Place 5 drops into the left ear 2 (two) times daily for 7 days. 5 mL Notnamed Scholz, [place], PA-C      PDMP not reviewed this encounter.   Conlin Brahm, [place], PA-C 09/19/23 1509

## 2023-09-19 NOTE — ED Triage Notes (Signed)
 Reports left ear pain and bleeding that started 2 hours ago.  Initially, ear was itching, used a q tip.  Used alcohol in ear.  Afterwards, was lying on left side (ear) and had extreme pain in left ear, raised head up and noticed bleeding.  Pain continues.    Reports uri symptoms prior to this for 5 days.    Has been taking tylenol 

## 2023-09-19 NOTE — ED Triage Notes (Signed)
 Also, fell getting out of shower 3 days ago and left ankle is still slightly swollen per patient

## 2023-09-22 ENCOUNTER — Ambulatory Visit: Payer: Medicare Other | Admitting: Physician Assistant

## 2023-10-04 ENCOUNTER — Other Ambulatory Visit: Payer: Self-pay | Admitting: Gastroenterology

## 2023-10-30 ENCOUNTER — Other Ambulatory Visit: Payer: Medicare Other

## 2023-10-30 ENCOUNTER — Encounter: Payer: Self-pay | Admitting: Gastroenterology

## 2023-10-30 ENCOUNTER — Ambulatory Visit: Payer: Medicare Other | Admitting: Gastroenterology

## 2023-10-30 VITALS — BP 130/90 | HR 95 | Ht 69.0 in | Wt 220.0 lb

## 2023-10-30 DIAGNOSIS — R1030 Lower abdominal pain, unspecified: Secondary | ICD-10-CM

## 2023-10-30 DIAGNOSIS — K5100 Ulcerative (chronic) pancolitis without complications: Secondary | ICD-10-CM

## 2023-10-30 DIAGNOSIS — K219 Gastro-esophageal reflux disease without esophagitis: Secondary | ICD-10-CM | POA: Diagnosis not present

## 2023-10-30 DIAGNOSIS — R1013 Epigastric pain: Secondary | ICD-10-CM

## 2023-10-30 LAB — CBC WITH DIFFERENTIAL/PLATELET
Basophils Absolute: 0.1 10*3/uL (ref 0.0–0.1)
Basophils Relative: 0.6 % (ref 0.0–3.0)
Eosinophils Absolute: 0.2 10*3/uL (ref 0.0–0.7)
Eosinophils Relative: 1.9 % (ref 0.0–5.0)
HCT: 40.9 % (ref 36.0–46.0)
Hemoglobin: 13.9 g/dL (ref 12.0–15.0)
Lymphocytes Relative: 16 % (ref 12.0–46.0)
Lymphs Abs: 1.3 10*3/uL (ref 0.7–4.0)
MCHC: 34 g/dL (ref 30.0–36.0)
MCV: 85.2 fL (ref 78.0–100.0)
Monocytes Absolute: 0.4 10*3/uL (ref 0.1–1.0)
Monocytes Relative: 5.1 % (ref 3.0–12.0)
Neutro Abs: 6.2 10*3/uL (ref 1.4–7.7)
Neutrophils Relative %: 76.4 % (ref 43.0–77.0)
Platelets: 361 10*3/uL (ref 150.0–400.0)
RBC: 4.8 Mil/uL (ref 3.87–5.11)
RDW: 13.6 % (ref 11.5–15.5)
WBC: 8.2 10*3/uL (ref 4.0–10.5)

## 2023-10-30 LAB — COMPREHENSIVE METABOLIC PANEL
ALT: 29 U/L (ref 0–35)
AST: 18 U/L (ref 0–37)
Albumin: 4.3 g/dL (ref 3.5–5.2)
Alkaline Phosphatase: 93 U/L (ref 39–117)
BUN: 17 mg/dL (ref 6–23)
CO2: 28 meq/L (ref 19–32)
Calcium: 9.7 mg/dL (ref 8.4–10.5)
Chloride: 103 meq/L (ref 96–112)
Creatinine, Ser: 0.93 mg/dL (ref 0.40–1.20)
GFR: 65.53 mL/min (ref >60.00)
Glucose, Bld: 91 mg/dL (ref 70–99)
Potassium: 4 meq/L (ref 3.5–5.1)
Sodium: 140 meq/L (ref 135–145)
Total Bilirubin: 0.3 mg/dL (ref 0.2–1.2)
Total Protein: 7.1 g/dL (ref 6.0–8.3)

## 2023-10-30 LAB — C-REACTIVE PROTEIN: CRP: <1 mg/dL (ref 0.5–20.0)

## 2023-10-30 LAB — SEDIMENTATION RATE: Sed Rate: 31 mm/h — ABNORMAL HIGH (ref 0–30)

## 2023-10-30 MED ORDER — PANTOPRAZOLE SODIUM 40 MG PO TBEC
40.0000 mg | DELAYED_RELEASE_TABLET | Freq: Every day | ORAL | 3 refills | Status: AC
Start: 2023-10-30 — End: ?

## 2023-10-30 NOTE — Progress Notes (Signed)
 Chief Complaint: Epigastric pain Primary GI MD: Dr. Lavon Paganini  HPI: 63 year old female history of ulcerative pancolitis presents for evaluation of epigastric pain  Last seen in the office 08/2022 by Dr. 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  Last colonoscopy May 2024 shows Mayo score 1 proctosigmoid ulcerative colitis, quiescent since last exam  Discussed the use of AI scribe software for clinical note transcription with the patient, who gave verbal consent to proceed.  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  She experiences severe upper abdominal pain described as 'acid pain' that worsens with eating and sometimes occurs spontaneously. This pain has been present for a couple of weeks and is severe enough to almost take her breath away. She is currently taking Carafate twice a day and Pepcid at bedtime for this pain.  She also experiences lower abdominal cramping that lasted for four days. She takes dicyclomine about three times a day, which she feels helps if she takes enough. No current constipation, noting that her bowel movements are now soft and formed, although she experienced constipation previously. She mentions having mucousy, brownish-yellow stools at times.  Her medication regimen for ulcerative proctitis includes mesalamine, but she believes she was taken off . She takes dicyclomine for cramping and Carafate for upper abdominal pain. She also takes Pepcid once daily and occasionally uses Goody powders for headaches, about once or twice a week, taking only half a dose each time.      PREVIOUS GI WORKUP   Ulcerative colitis initially diagnosed in 1998 Allergic reaction to Remicade, was maintained on Humira with  loss of clinical response 2016 (elevated antibody level with undetectable drug trough), switched to [facility] May 2017, was discontinued by patient due to lack of adequate insurance coverage with high out-of-pocket deductible.  Subsequently switched to [facility] in 2018, but she has missed multiple infusions and was getting it erratically. Harriette Ohara had higher out-of-pocket deductible, patient did not want to start it.     Colonoscopy December 25, 2016: Moderately active proctitis, 12 mm polyp removed (sessile serrated adenoma)  Colonoscopy 01/2023 - Mild ( Mayo Score 1) proctosigmoid ulcerative colitis, quiescent since the last examination. Biopsied.  - Diverticulosis in the sigmoid colon and in the descending colon.  - Non- bleeding external and internal hemorrhoids. - repeat 3 years  . COLON, RIGHT, BIOPSY:       Colonic mucosa without significant diagnostic alteration.       No evidence of activity, chronicity, granuloma, dysplasia or  malignancy.   B. COLON, LEFT, BIOPSY:       Colonic mucosa with focal mild crypt architectural disarray.       No evidence of activity, granuloma, dysplasia or malignancy.    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   Pancreatitis    Rectal polyp 10/01/2015   Ulcerative colitis     Past Surgical History:  Procedure Laterality Date   BIOPSY  02/03/2023  Procedure: BIOPSY;  Surgeon: Napoleon Form, MD;  Location: [facility];  Service: Gastroenterology;;   BREAST CYST ASPIRATION     left   COLONOSCOPY WITH PROPOFOL N/A 02/03/2023   Procedure: COLONOSCOPY WITH PROPOFOL;  Surgeon: Napoleon Form, MD;  Location: [facility];  Service: Gastroenterology;  Laterality: N/A;   ESOPHAGUS SURGERY     SINUS SURGERY WITH  INSTATRAK      Current Outpatient Medications  Medication Sig Dispense Refill   Aspirin-Caffeine (BC FAST PAIN RELIEF PO) Take 0.5 packets by mouth daily as needed (pain).     bismuth subsalicylate (PEPTO BISMOL) 262 MG/15ML suspension Take 30 mLs by mouth every 6 (six) hours as needed for indigestion or diarrhea or loose stools.     buPROPion (WELLBUTRIN XL) 150 MG 24 hr tablet Take 150 mg by mouth daily.     Cholecalciferol (VITAMIN D) 50 MCG (2000 UT) tablet Take 8,000 Units by mouth daily.     Cyanocobalamin (B-12) 5000 MCG CAPS Take 5,000 mcg by mouth daily.     dicyclomine (BENTYL) 20 MG tablet TAKE 1 TABLET BY MOUTH 4 TIMES DAILY WITH MEALS AND AT BEDTIME 120 tablet 0   famotidine (PEPCID) 40 MG tablet Take 40 mg by mouth daily.     ferrous sulfate 325 (65 FE) MG tablet Take 325 mg by mouth daily with breakfast.       folic acid (FOLVITE) 1 MG tablet Take 1 tablet by mouth once daily 90 tablet 0   guaiFENesin (MUCINEX) 600 MG 12 hr tablet Take 600 mg by mouth 2 (two) times daily.     mesalamine (LIALDA) 1.2 g EC tablet TAKE 4 TABLETS BY MOUTH ONCE DAILY WITH BREAKFAST 120 tablet 0   mupirocin ointment (BACTROBAN) 2 % Apply 1 application  topically 3 (three) times daily as needed (wound care).     nystatin ointment (MYCOSTATIN) Apply 1 application. topically 2 (two) times daily as needed (skin irriation).     nystatin powder Apply 1 Application topically 2 (two) times daily.     pantoprazole (PROTONIX) 40 MG tablet Take 1 tablet (40 mg total) by mouth daily. 90 tablet 3   rosuvastatin (CRESTOR) 10 MG tablet Take 10 mg by mouth daily.       sucralfate (CARAFATE) 1 g tablet Take 1 tablet by mouth twice daily 60 tablet 0   venlafaxine XR (EFFEXOR-XR) 150 MG 24 hr capsule Take 150 mg by mouth daily with breakfast.     No current facility-administered medications for this visit.    Allergies as of 10/30/2023 - Review Complete 10/30/2023  Allergen Reaction Noted   Valsartan Swelling  01/07/2022   Amoxicillin Other (See Comments) 08/08/2014   Infliximab Other (See Comments) 08/26/2006   Molds & smuts  02/09/2013   Oseltamivir Nausea And Vomiting and Other (See Comments) 10/09/2016    Family History  Problem Relation Age of Onset   Irritable bowel syndrome Father    COPD Father    Diabetes Maternal Grandfather    Colon cancer Neg Hx    Stomach cancer Neg Hx     Social History   Socioeconomic History   Marital status: Married    Spouse name: Not on file   Number of children: 0   Years of education: Not on file   Highest education level: Not on file  Occupational History   Occupation: FLIGHT ATTENDANT  Tobacco Use   Smoking status: Never   Smokeless tobacco: Never  Vaping Use   Vaping  status: Never Used  Substance and Sexual Activity   Alcohol use: No    Alcohol/week: 0.0 standard drinks of alcohol   Drug use: No   Sexual activity: Yes    Partners: Male    Birth control/protection: None  Other Topics Concern   Not on file  Social History Narrative   Not on file   Social Drivers of Health   Financial Resource Strain: Low Risk  (07/10/2020)   Received [facility] visits prior to 11/08/2022., [facility] visits prior to 11/08/2022.   Overall Financial Resource Strain (CARDIA)    Difficulty of Paying Living Expenses: Not hard at all  Food Insecurity: Low Risk  (05/13/2023)   [facility]   Hunger Vital Sign    Worried About Running Out of Food in the Last Year: Never true    Ran Out of Food in the Last Year: Never true  Transportation Needs: No Transportation Needs (05/13/2023)   Received from [facility]    In the past 12 months, has lack of reliable transportation kept you from medical appointments, meetings, work or from getting things needed for daily living? : No  Physical Activity: Unknown (07/10/2020)   [facility] visits prior to  11/08/2022., [facility] visits prior to 11/08/2022.   Exercise Vital Sign    Days of Exercise per Week: 0 days    Minutes of Exercise per Session: Not on file  Stress: Stress Concern Present (07/10/2020)   [facility] prior to 11/08/2022., [facility] visits prior to 11/08/2022.   [employer] of Occupational Health - Occupational Stress Questionnaire    Feeling of Stress : Very much  Social Connections: Moderately Isolated (07/10/2020)   Received from [facility] visits prior to 11/08/2022., [facility] visits prior to 11/08/2022.   Social Connection and Isolation Panel [NHANES]    Frequency of Communication with Friends and Family: More than three times a week    Frequency of Social Gatherings with Friends and Family: Never    Attends Religious Services: Never    Database administrator or Organizations: No    Attends Banker Meetings: Not on file    Marital Status: Married  Intimate Partner Violence: Not At Risk (07/10/2020)   [facility] prior to 11/08/2022., [facility] visits prior to 11/08/2022.   Humiliation, Afraid, Rape, and Kick questionnaire    Fear of Current or Ex-Partner: No    Emotionally Abused: No    Physically Abused: No    Sexually Abused: No    Review of Systems:    Constitutional: No weight loss, fever, chills, weakness or fatigue HEENT: Eyes: No change in vision               Ears, Nose, Throat:  No change in hearing or congestion Skin: No rash or itching Cardiovascular: No chest pain, chest pressure or palpitations   Respiratory: No SOB or cough Gastrointestinal: See HPI and otherwise negative Genitourinary: No dysuria or change in urinary frequency Neurological: No headache, dizziness or syncope Musculoskeletal: No new muscle or joint pain Hematologic: No bleeding  or bruising Psychiatric: No history of depression or anxiety    Physical Exam:  Vital signs: BP (!) 130/90   Pulse 95   Ht  5\' 9"  (1.753 m)   Wt 220 lb (99.8 kg)   LMP 11/12/2016 (Approximate)   BMI 32.49 kg/m   Constitutional: NAD, Well developed, Well nourished, alert and cooperative Head:  Normocephalic and atraumatic. Eyes:   PEERL, EOMI. No icterus. Conjunctiva pink. Respiratory: Respirations even and unlabored. Lungs clear to auscultation bilaterally.   No wheezes, crackles, or rhonchi.  Cardiovascular:  Regular rate and rhythm. No peripheral edema, cyanosis or pallor.  Gastrointestinal:  Soft, nondistended, nontender. No rebound or guarding. Normal bowel sounds. No appreciable masses or hepatomegaly. Rectal:  Not performed.  Msk:  Symmetrical without gross deformities. Without edema, no deformity or joint abnormality.  Neurologic:  Alert and  oriented x4;  grossly normal neurologically.  Skin:   Dry and intact without significant lesions or rashes. Psychiatric: Oriented to person, place and time. Demonstrates good judgement and reason without abnormal affect or behaviors.    RELEVANT LABS AND IMAGING: CBC    Component Value Date/Time   WBC 8.3 08/28/2022 0954   RBC 4.73 08/28/2022 0954   HGB 13.5 08/28/2022 0954   HCT 40.6 08/28/2022 0954   PLT 367.0 08/28/2022 0954   MCV 85.7 08/28/2022 0954   MCH 28.5 01/08/2022 0632   MCHC 33.2 08/28/2022 0954   RDW 14.3 08/28/2022 0954   LYMPHSABS 1.7 08/28/2022 0954   MONOABS 0.5 08/28/2022 0954   EOSABS 0.3 08/28/2022 0954   BASOSABS 0.1 08/28/2022 0954    CMP     Component Value Date/Time   NA 142 08/28/2022 0954   K 3.5 08/28/2022 0954   CL 106 08/28/2022 0954   CO2 22 08/28/2022 0954   GLUCOSE 114 (H) 08/28/2022 0954   BUN 12 08/28/2022 0954   CREATININE 0.70 08/28/2022 0954   CALCIUM 9.8 08/28/2022 0954   PROT 7.3 08/28/2022 0954   ALBUMIN 4.5 08/28/2022 0954   AST 17 08/28/2022 0954   ALT 37 (H) 08/28/2022  0954   ALKPHOS 116 08/28/2022 0954   BILITOT 0.3 08/28/2022 0954   GFRNONAA >60 01/08/2022 0632   GFRAA >60 05/28/2020 1016     Assessment/Plan:      Ulcerative colitis History of pancolonic ulcerative colitis in clinical remission maintained on Lialda 4.8G daily.  Having soft formed bowel movements twice daily without blood.  Occasional lower abdominal pain controlled with dicyclomine --Continue dicyclomine 3-4 times daily as needed for lower abdominal cramping. -- CRP/ ESR, CBC, CMP -- Fecal calprotectin - Continue Lialda 4.8G daily  GERD Epigastric pain Reports of severe epigastric pain, worsened by eating. Currently on Carafate twice daily and Pepcid 40 Mg once daily without adequate control.  History of Goody powder use twice per week.  Patient does not want endoscopy as she has 4 loose teeth on her lower mouth and is concerned about the risk of breaking teeth.  She would like to hold off on endoscopy unless absolutely necessary. -- pantoprazole 40mg  once daily --Continue Carafate twice daily, can increase to four times daily if needed (advised of constipation side effects). --Continue Pepcid at bedtime. --Consider endoscopy if symptoms do not improve with medication adjustment.  General Health Maintenance -Advise against regular use of Goody powders due to risk of ulcer formation. -Follow-up in 6-8 weeks to assess response to medication adjustments.       Boone Master, PA-C [facility] 10/30/2023, 9:55 AM  Cc: Shellia Cleverly, [place]

## 2023-10-30 NOTE — Patient Instructions (Addendum)
 Your provider has requested that you go to the basement level for lab work before leaving today. Press "B" on the elevator. The lab is located at the first door on the left as you exit the elevator.   We have sent the following medications to your pharmacy for you to pick up at your convenience: Pantoprazole 40 mg once daily.  You have been scheduled for a follow up appointment on 12/11/23 at 9:20 AM. Please arrive 10 minutes early for registration. If you need to reschedule or cancel this appointment please call [phone] as soon as possible. Thank you.   Today, we discussed your ongoing issues with upper and lower abdominal pain. We reviewed your current medications and made some adjustments to help manage your symptoms more effectively. We also talked about some general health maintenance tips to improve your overall well-being.  YOUR PLAN:  -ULCERATIVE PROCTITIS: Ulcerative proctitis is a type of inflammatory bowel disease that affects the rectum. You will continue taking dicyclomine 3-4 times daily as needed for lower abdominal cramping. We will also order labs and a stool study to check for inflammation in your colon.  -GASTROESOPHAGEAL REFLUX DISEASE (GERD): GERD is a condition where stomach acid frequently flows back into the tube connecting your mouth and stomach, causing pain. We are adding Pantoprazole in the morning to your medication regimen. Continue taking Carafate twice daily, and you can increase it to four times daily if needed. Keep taking Pepcid at bedtime. If your symptoms do not improve, we may consider an endoscopy.  -GENERAL HEALTH MAINTENANCE: We advise against the regular use of Goody powders due to the risk of ulcer formation. Consider using enteric-coated ibuprofen for headache relief. We will follow up in 6-8 weeks to assess your response to the medication adjustments.  INSTRUCTIONS:  Please follow up in 6-8 weeks to assess your response to the medication  adjustments. We will also review the results of your labs and stool study at that time.   Due to recent changes in healthcare laws, you may see the results of your imaging and laboratory studies on MyChart before your provider has had a chance to review them.  We understand that in some cases there may be results that are confusing or concerning to you. Not all laboratory results come back in the same time frame and the provider may be waiting for multiple results in order to interpret others.  Please give [place] 48 hours in order for your provider to thoroughly review all the results before contacting the office for clarification of your results.    Thank you for trusting me with your gastrointestinal care!   Boone Master, PA  _______________________________________________________  If your blood pressure at your visit was 140/90 or greater, please contact your primary care physician to follow up on this.  _______________________________________________________  If you are age 64 or older, your body mass index should be between 23-30. Your Body mass index is 32.49 kg/m. If this is out of the aforementioned range listed, please consider follow up with your Primary Care Provider.  If you are age 19 or younger, your body mass index should be between 19-25. Your Body mass index is 32.49 kg/m. If this is out of the aformentioned range listed, please consider follow up with your Primary Care Provider.   ________________________________________________________  The [facility] providers would like to encourage you to use [facility] to communicate with providers for non-urgent requests or questions.  Due to long hold times on the telephone, sending  your provider a message by [facility] may be a faster and more efficient way to get a response.  Please allow 48 business hours for a response.  Please remember that this is for non-urgent requests.   _______________________________________________________

## 2023-11-02 ENCOUNTER — Telehealth: Payer: Self-pay

## 2023-11-02 DIAGNOSIS — K5100 Ulcerative (chronic) pancolitis without complications: Secondary | ICD-10-CM

## 2023-11-02 DIAGNOSIS — R1030 Lower abdominal pain, unspecified: Secondary | ICD-10-CM

## 2023-11-02 DIAGNOSIS — K5190 Ulcerative colitis, unspecified, without complications: Secondary | ICD-10-CM

## 2023-11-02 NOTE — Telephone Encounter (Signed)
 FMLA papers completed by [facility] and faxed to [facility]

## 2023-11-03 ENCOUNTER — Other Ambulatory Visit: Payer: Medicare Other

## 2023-11-03 DIAGNOSIS — K219 Gastro-esophageal reflux disease without esophagitis: Secondary | ICD-10-CM

## 2023-11-03 DIAGNOSIS — K5100 Ulcerative (chronic) pancolitis without complications: Secondary | ICD-10-CM

## 2023-11-03 DIAGNOSIS — R1030 Lower abdominal pain, unspecified: Secondary | ICD-10-CM

## 2023-11-03 DIAGNOSIS — R1013 Epigastric pain: Secondary | ICD-10-CM

## 2023-11-05 ENCOUNTER — Other Ambulatory Visit: Payer: Self-pay | Admitting: Gastroenterology

## 2023-11-05 LAB — CALPROTECTIN, FECAL: Calprotectin, Fecal: 199 ug/g — ABNORMAL HIGH (ref 0–120)

## 2023-11-20 ENCOUNTER — Ambulatory Visit (HOSPITAL_COMMUNITY)

## 2023-11-20 ENCOUNTER — Other Ambulatory Visit: Payer: Self-pay | Admitting: Gastroenterology

## 2023-11-27 ENCOUNTER — Ambulatory Visit (HOSPITAL_COMMUNITY)

## 2023-11-27 ENCOUNTER — Encounter (HOSPITAL_COMMUNITY): Payer: Self-pay

## 2023-12-05 ENCOUNTER — Other Ambulatory Visit: Payer: Self-pay | Admitting: Gastroenterology

## 2023-12-08 NOTE — Addendum Note (Signed)
 Addended by: Richardson Chiquito on: 12/08/2023 03:00 PM   Modules accepted: Orders

## 2023-12-09 ENCOUNTER — Ambulatory Visit (HOSPITAL_COMMUNITY)

## 2023-12-11 ENCOUNTER — Ambulatory Visit: Payer: Medicare Other | Admitting: Gastroenterology

## 2023-12-18 ENCOUNTER — Telehealth: Payer: Self-pay

## 2023-12-18 NOTE — Telephone Encounter (Signed)
 Contacted patient and  patient verbalized understanding of negative Gi pathogen with c diff stool test.

## 2023-12-21 MED ORDER — PREDNISONE 10 MG PO TABS
ORAL_TABLET | ORAL | 0 refills | Status: AC
Start: 2023-12-21 — End: 2024-01-18

## 2023-12-21 NOTE — Addendum Note (Signed)
 Addended by: Glennette Lanius on: 12/21/2023 08:28 AM   Modules accepted: Orders

## 2023-12-31 ENCOUNTER — Other Ambulatory Visit: Payer: Self-pay | Admitting: Gastroenterology

## 2024-01-05 ENCOUNTER — Other Ambulatory Visit: Payer: Self-pay | Admitting: Gastroenterology

## 2024-01-05 ENCOUNTER — Encounter: Payer: Self-pay | Admitting: Gastroenterology

## 2024-01-07 ENCOUNTER — Ambulatory Visit (HOSPITAL_COMMUNITY)

## 2024-01-22 ENCOUNTER — Ambulatory Visit (HOSPITAL_COMMUNITY)

## 2024-01-22 ENCOUNTER — Encounter (HOSPITAL_COMMUNITY): Payer: Self-pay

## 2024-02-12 ENCOUNTER — Ambulatory Visit (HOSPITAL_COMMUNITY)

## 2024-02-17 ENCOUNTER — Other Ambulatory Visit: Payer: Self-pay | Admitting: Gastroenterology

## 2024-02-18 ENCOUNTER — Ambulatory Visit: Admitting: Gastroenterology

## 2024-03-04 ENCOUNTER — Ambulatory Visit (HOSPITAL_COMMUNITY)

## 2024-03-05 ENCOUNTER — Other Ambulatory Visit: Payer: Self-pay | Admitting: Gastroenterology

## 2024-03-12 ENCOUNTER — Other Ambulatory Visit: Payer: Self-pay | Admitting: Gastroenterology

## 2024-03-23 ENCOUNTER — Ambulatory Visit (HOSPITAL_COMMUNITY)

## 2024-03-30 ENCOUNTER — Ambulatory Visit: Admitting: Gastroenterology

## 2024-04-10 ENCOUNTER — Other Ambulatory Visit: Payer: Self-pay | Admitting: Gastroenterology

## 2024-04-26 ENCOUNTER — Ambulatory Visit (HOSPITAL_COMMUNITY)

## 2024-05-03 ENCOUNTER — Ambulatory Visit: Admitting: Gastroenterology

## 2024-05-12 ENCOUNTER — Other Ambulatory Visit: Payer: Self-pay | Admitting: Gastroenterology

## 2024-05-18 ENCOUNTER — Other Ambulatory Visit: Payer: Self-pay | Admitting: Gastroenterology

## 2024-05-20 ENCOUNTER — Ambulatory Visit (HOSPITAL_COMMUNITY)

## 2024-06-11 ENCOUNTER — Other Ambulatory Visit: Payer: Self-pay | Admitting: Gastroenterology

## 2024-06-14 ENCOUNTER — Ambulatory Visit (HOSPITAL_COMMUNITY): Admission: RE | Admit: 2024-06-14 | Source: Ambulatory Visit

## 2024-06-21 ENCOUNTER — Ambulatory Visit: Admitting: Gastroenterology

## 2024-07-15 ENCOUNTER — Ambulatory Visit (HOSPITAL_COMMUNITY)

## 2024-07-26 ENCOUNTER — Ambulatory Visit (HOSPITAL_COMMUNITY)

## 2024-08-10 ENCOUNTER — Other Ambulatory Visit: Payer: Self-pay | Admitting: Gastroenterology

## 2024-08-11 ENCOUNTER — Ambulatory Visit: Admitting: Gastroenterology

## 2024-08-18 ENCOUNTER — Other Ambulatory Visit: Payer: Self-pay | Admitting: Gastroenterology

## 2024-08-24 ENCOUNTER — Ambulatory Visit (HOSPITAL_COMMUNITY)

## 2024-09-16 ENCOUNTER — Ambulatory Visit: Admitting: Gastroenterology

## 2024-09-16 NOTE — Telephone Encounter (Signed)
Dr Silverio Decamp ok to refill ?

## 2024-09-20 MED ORDER — MESALAMINE 1.2 G PO TBEC: 4.8000 g | DELAYED_RELEASE_TABLET | Freq: Every day | ORAL | 2 refills | Status: AC

## 2024-09-20 NOTE — Addendum Note (Signed)
 Addended by: ANITRA ODETTA CROME on: 09/20/2024 08:58 AM   Modules accepted: Orders

## 2024-10-18 ENCOUNTER — Ambulatory Visit: Admitting: Gastroenterology
